# Patient Record
Sex: Male | Born: 1945
Health system: Southern US, Community
[De-identification: ages and names within clinical notes are randomized; demographics above are authoritative.]

## PROBLEM LIST (undated history)

## (undated) DIAGNOSIS — J449 Chronic obstructive pulmonary disease, unspecified: Secondary | ICD-10-CM

## (undated) DIAGNOSIS — I10 Essential (primary) hypertension: Secondary | ICD-10-CM

## (undated) DIAGNOSIS — M159 Polyosteoarthritis, unspecified: Secondary | ICD-10-CM

## (undated) DIAGNOSIS — E785 Hyperlipidemia, unspecified: Secondary | ICD-10-CM

## (undated) DIAGNOSIS — I499 Cardiac arrhythmia, unspecified: Secondary | ICD-10-CM

## (undated) DIAGNOSIS — K219 Gastro-esophageal reflux disease without esophagitis: Secondary | ICD-10-CM

## (undated) DIAGNOSIS — R7303 Prediabetes: Secondary | ICD-10-CM

## (undated) DIAGNOSIS — M1990 Unspecified osteoarthritis, unspecified site: Secondary | ICD-10-CM

## (undated) DIAGNOSIS — R011 Cardiac murmur, unspecified: Secondary | ICD-10-CM

## (undated) DIAGNOSIS — R002 Palpitations: Secondary | ICD-10-CM

## (undated) DIAGNOSIS — R7302 Impaired glucose tolerance (oral): Secondary | ICD-10-CM

## (undated) DIAGNOSIS — Z87898 Personal history of other specified conditions: Secondary | ICD-10-CM

## (undated) DIAGNOSIS — D509 Iron deficiency anemia, unspecified: Secondary | ICD-10-CM

## (undated) DIAGNOSIS — G5603 Carpal tunnel syndrome, bilateral upper limbs: Secondary | ICD-10-CM

## (undated) DIAGNOSIS — T7840XA Allergy, unspecified, initial encounter: Secondary | ICD-10-CM

## (undated) HISTORY — DX: Allergy, unspecified, initial encounter: T78.40XA

## (undated) HISTORY — DX: Essential (primary) hypertension: I10

## (undated) HISTORY — DX: Impaired glucose tolerance (oral): R73.02

## (undated) HISTORY — PX: COLONOSCOPY: SHX174

## (undated) HISTORY — DX: Chronic obstructive pulmonary disease, unspecified: J44.9

## (undated) HISTORY — DX: Carpal tunnel syndrome, bilateral upper limbs: G56.03

## (undated) HISTORY — DX: Unspecified osteoarthritis, unspecified site: M19.90

## (undated) HISTORY — DX: Polyosteoarthritis, unspecified: M15.9

## (undated) HISTORY — DX: Hyperlipidemia, unspecified: E78.5

## (undated) HISTORY — DX: Palpitations: R00.2

## (undated) HISTORY — DX: Iron deficiency anemia, unspecified: D50.9

## (undated) HISTORY — PX: TONSILLECTOMY: SUR1361

## (undated) HISTORY — PX: APPENDECTOMY: SHX54

## (undated) HISTORY — PX: JOINT REPLACEMENT: SHX530

## (undated) HISTORY — DX: Personal history of other specified conditions: Z87.898

---

## 2003-06-02 DIAGNOSIS — Z87898 Personal history of other specified conditions: Secondary | ICD-10-CM

## 2003-06-02 HISTORY — DX: Personal history of other specified conditions: Z87.898

## 2004-09-30 ENCOUNTER — Encounter: Payer: Self-pay | Admitting: Internal Medicine

## 2008-06-01 DIAGNOSIS — R002 Palpitations: Secondary | ICD-10-CM

## 2008-06-01 HISTORY — DX: Palpitations: R00.2

## 2008-07-18 ENCOUNTER — Ambulatory Visit: Payer: Self-pay | Admitting: Internal Medicine

## 2008-08-14 DIAGNOSIS — R002 Palpitations: Secondary | ICD-10-CM | POA: Insufficient documentation

## 2008-08-14 DIAGNOSIS — I10 Essential (primary) hypertension: Secondary | ICD-10-CM

## 2008-08-14 DIAGNOSIS — E782 Mixed hyperlipidemia: Secondary | ICD-10-CM | POA: Insufficient documentation

## 2008-08-15 ENCOUNTER — Encounter: Payer: Self-pay | Admitting: Internal Medicine

## 2008-08-15 ENCOUNTER — Encounter (INDEPENDENT_AMBULATORY_CARE_PROVIDER_SITE_OTHER): Payer: Self-pay | Admitting: *Deleted

## 2008-08-15 ENCOUNTER — Ambulatory Visit: Payer: Self-pay | Admitting: Internal Medicine

## 2008-11-16 ENCOUNTER — Ambulatory Visit: Payer: Self-pay | Admitting: Internal Medicine

## 2008-11-16 ENCOUNTER — Encounter: Payer: Self-pay | Admitting: Internal Medicine

## 2009-05-01 ENCOUNTER — Ambulatory Visit: Payer: Self-pay | Admitting: Internal Medicine

## 2009-11-26 ENCOUNTER — Telehealth: Payer: Self-pay | Admitting: Internal Medicine

## 2009-12-30 LAB — HM COLONOSCOPY

## 2010-01-07 ENCOUNTER — Ambulatory Visit: Payer: Self-pay | Admitting: Internal Medicine

## 2010-01-14 ENCOUNTER — Encounter: Payer: Self-pay | Admitting: Internal Medicine

## 2010-01-28 ENCOUNTER — Telehealth: Payer: Self-pay | Admitting: Internal Medicine

## 2010-01-31 ENCOUNTER — Telehealth: Payer: Self-pay | Admitting: Internal Medicine

## 2010-02-10 ENCOUNTER — Telehealth: Payer: Self-pay | Admitting: Internal Medicine

## 2010-02-11 ENCOUNTER — Telehealth: Payer: Self-pay | Admitting: Internal Medicine

## 2010-02-19 ENCOUNTER — Ambulatory Visit: Payer: Self-pay | Admitting: Internal Medicine

## 2010-02-28 ENCOUNTER — Encounter: Payer: Self-pay | Admitting: Internal Medicine

## 2010-02-28 LAB — CONVERTED CEMR LAB
Calcium: 9 mg/dL (ref 8.4–10.5)
Glucose, Bld: 113 mg/dL — ABNORMAL HIGH (ref 70–99)
Potassium: 3.7 meq/L (ref 3.5–5.3)
Sodium: 137 meq/L (ref 135–145)

## 2010-06-29 LAB — CONVERTED CEMR LAB
AST: 25 units/L
BUN: 16 mg/dL
Chloride: 102 meq/L
Cholesterol: 155 mg/dL
Ferritin: 8 ng/mL
HDL: 53 mg/dL
Hgb A1c MFr Bld: 6.2 %
Iron: 49 ug/dL
Potassium: 4.7 meq/L
Sodium: 140 meq/L
TSH: 0.75 microintl units/mL
Triglyceride fasting, serum: 108 mg/dL

## 2010-07-01 NOTE — Letter (Signed)
Summary: BP Readings  BP Readings   Imported By: Harlon Flor 01/14/2010 15:33:35  _____________________________________________________________________  External Attachment:    Type:   Image     Comment:   External Document  Appended Document: BP Readings Would you like to increase pt's lisinopril? Thanks.

## 2010-07-01 NOTE — Progress Notes (Signed)
Summary: RX QUESTIONS  Phone Note Call from Patient Call back at Home Phone [phone]   Caller: SELF Call For: Mark Farley Summary of Call: Mark Farley WANTED TO INCREASE DOSAGE IF BP WAS OVER 75-BP IS OVER THAT AND THE DOSAGE IS NOT INCREASED ON THE RX Initial call taken by: Harlon Flor,  January 31, 2010 11:04 AM  Follow-up for Phone Call        Dr. Prescott Gum last note stated that if BP >135 he would increase lisinopril. Pt's Bp readings more than half are less that 135 so no medictions would be changed at this time. Asked the pt to continue checking is BP's and recording them.  Follow-up by: Benedict Needy, RN,  January 31, 2010 11:19 AM

## 2010-07-01 NOTE — Assessment & Plan Note (Signed)
Summary: F6M/AMD   Visit Type:  Follow-up Primary Labrenda Lasky:  Michel Harrow PA-C at [facility]  CC:  NO COMPLAINTS.  History of Present Illness: 65 y/o  male with h/o HTN, HL and previous syncope with negative Myoview in 2005. He also has a h/o palpitations and had a Life Watch monitor for several weeks but no events. Monitor only caught a single PVC.   Remains very active exercising on elliptical and bike and weights for 2 hours 5x week. No CP or undue dyspnea during exercise. Minimal palpitations. Active with beekeeping.   Following cholesterol at [facility] which has improved. Recent lipids TC 1160 TG 83 HDL 48 LDL 95 (up from 80). HgBa1c 6.0.   Following BPs and systolics typically under 110. BP cuff calibrated within last year and it was accurate.   Current Medications (verified): 1)  Multivitamins   Tabs (Multiple Vitamin) .Marland Kitchen.. 1 Tab Once Daily 2)  Vitamin C 500 Mg  Tabs (Ascorbic Acid) .Marland Kitchen.. 1 Tab Once Daily 3)  Fish Oil   Oil1000mg   (Fish Oil) .Marland Kitchen.. 1 Tab Once Daily 4)  Hydrochlorothiazide 25 Mg Tabs (Hydrochlorothiazide) .Marland Kitchen.. 1 Tab Once Daily 5)  Simvastatin 20 Mg Tabs (Simvastatin) .... Take 1 1/2 Once Daily 6)  Aspirin 81 Mg Tbec (Aspirin) .... Take One Tablet By Mouth Daily 7)  Vitamin E 600 Unit  Caps (Vitamin E) .Marland Kitchen.. 1 Tab Once Daily 8)  Lisinopril 10 Mg Tabs (Lisinopril) .Marland Kitchen.. 1 Tab Once Daily 9)  Etodolac 400 Mg Tabs (Etodolac) .Marland Kitchen.. 1 Tab Once Daily 10)  Omeprazole 20 Mg Tbec (Omeprazole) .Marland Kitchen.. 1 Tab Once Daily 11)  Toprol Xl 25 Mg Xr24h-Tab (Metoprolol Succinate) .... Take 1 Tablet By Mouth Once A Day 12)  Vitamin C 250 Mg Tabs (Ascorbic Acid) .Marland Kitchen.. 1 Tab By Mouth Three Times A Day 13)  Iron 325 (65 Fe) Mg Tabs (Ferrous Sulfate) .Marland Kitchen.. 1 Tab By Mouth Three Times A Day  Allergies (verified): No Known Drug Allergies  Past History:  Past Medical History: Last updated: 05/01/2009  1. History of syncope, unexplained 2005.       a.     Myoview negative.   2. Hypertension  3.  Hyperlipidemia  4. Palpiptations      --Life Watch with single PVC, 2010  5. Arthritis (?gout)  6. Iron def anemia  7. Glucose intolerance  Review of Systems       As per HPI and past medical history; otherwise all systems negative.   Vital Signs:  Patient profile:   65 year old male Height:      67 inches Weight:      195 pounds BMI:     30.65 Pulse rate:   65 / minute BP sitting:   146 / 84  (left arm) Cuff size:   regular  Vitals Entered By: Hardin Negus, RMA (January 07, 2010 11:07 AM)  Physical Exam  General:  Gen: well appearing. no resp difficulty. muscular HEENT: normal Neck: supple. no JVD. Carotids 2+ bilat; no bruits.  Cor: PMI nondisplaced. Regular rate & rhythm. No rubs, gallops, murmur. Lungs: clear Abdomen: soft, nontender, nondistended. Good bowel sounds. Extremities: no cyanosis, clubbing, rash, edema Neuro: alert & orientedx3, cranial nerves grossly intact. moves all 4 extremities w/o difficulty. affect pleasant    Impression & Recommendations:  Problem # 1:  PALPITATIONS (ICD-785.1) Essentially quiescent.  No further work-up at this point.  Problem # 2:  HYPERTENSION, BENIGN (ICD-401.1) Suspect white-coat syndrome. Will check BP daily at  home for 2-3 weeks and send to [place]. If systolic > 135 will increase lisinopril 20.  Problem # 3:  HYPERLIPIDEMIA (ICD-272.4) LDL creeping up a littl ebit but still under 100. Continue simva. Reinforced need to watch diet.   Other Orders: EKG w/ Interpretation (93000)  Patient Instructions: 1)  Your physician recommends that you continue on your current medications as directed. Please refer to the Current Medication list given to you today. 2)  Your physician wants you to follow-up in:   9 months You will receive a reminder letter in the mail two months in advance. If you don't receive a letter, please call our office to schedule the follow-up appointment. 3)  Your physician has requested that you regularly  monitor and record your blood pressure readings at home.Please fax these records to [place] in 1 week.

## 2010-07-01 NOTE — Progress Notes (Signed)
Summary: MED CHANGES  Phone Note Call from Patient Call back at Home Phone [phone]   Caller: SELF Call For: BENSIMHON Summary of Call: IS PT SUPPOSED TO START THE MEDICATION CHANGES NOW Initial call taken by: Harlon Flor,  February 11, 2010 2:28 PM  Follow-up for Phone Call        pt to start new dosage now Benedict Needy, RN  February 11, 2010 2:49 PM

## 2010-07-01 NOTE — Letter (Signed)
Summary: [place] [employer], agricultural at [address]   [place], [place] [address]   Phone: [phone]  Fax: [phone]      February 28, 2010 MRN: [id number]   Mark Farley [address] [place], [place]  [address]   Dear Mr. BOUTELLE,  Your test ordered by Selena Batten has been reviewed by your physician (or physician assistant) and was found to be normal or stable. Your physician (or physician assistant) felt no changes were needed at this time.  ____ Echocardiogram  ____ Cardiac Stress Test  __x__ Lab Work  ____ Peripheral vascular study of arms, legs or neck  ____ CT scan or X-ray  ____ Lung or Breathing test  ____ Other:   Thank you.   Benedict Needy, RN    Arvilla Meres, MD, F.A.C.C

## 2010-07-01 NOTE — Progress Notes (Signed)
Summary: RX  Phone Note Refill Request Call back at Home Phone [phone] Message from:  Patient on January 28, 2010 11:28 AM  Refills Requested: Medication #1:  SIMVASTATIN 20 MG TABS take 1 1/2 once daily  Medication #2:  HYDROCHLOROTHIAZIDE 25 MG TABS 1 tab once daily [facility]Y ONLY TAKE WRITTEN RX-PT IS ALSO WAITING TO HEAR IF HE NEEDS TO CONTINUE LISINOPRIL AFTER GOLLAN REVIEWED HIS BP READINGS THAT HE CALLED IN 2 WEEKS AGO  Initial call taken by: Harlon Flor,  January 28, 2010 11:31 AM  Follow-up for Phone Call        Told patients wife we will call tomorrow with Rx's are ready to be picked up. Follow-up by: Bishop Dublin, CMA,  January 29, 2010 1:38 PM    Prescriptions: LISINOPRIL 10 MG TABS (LISINOPRIL) 1 tab once daily  #90 x 4   Entered by:   Benedict Needy, RN   Authorized by:   Dolores Patty, MD, [facility]   Signed by:   Benedict Needy, RN on 01/29/2010   Method used:   Print then Give to Patient   RxID:   [id number] SIMVASTATIN 20 MG TABS (SIMVASTATIN) take 1 1/2 once daily  #135 x 4   Entered by:   Benedict Needy, RN   Authorized by:   Dolores Patty, MD, [facility]   Signed by:   Benedict Needy, RN on 01/29/2010   Method used:   Print then Give to Patient   RxID:   [id number] HYDROCHLOROTHIAZIDE 25 MG TABS (HYDROCHLOROTHIAZIDE) 1 tab once daily  #90 x 4   Entered by:   Benedict Needy, RN   Authorized by:   Dolores Patty, MD, [facility]   Signed by:   Benedict Needy, RN on 01/29/2010   Method used:   Print then Give to Patient   RxID:   [id number]

## 2010-07-01 NOTE — Progress Notes (Signed)
Summary: BP  Phone Note Call from Patient Call back at Home Phone [phone]   Caller: Patient Call For: Morrie Sheldon Summary of Call: Patient called wanting to know if Morrie Sheldon had checked with Dr. Gala Romney yet about his BP readings.  Either way please call patient back today, per patient's request. Initial call taken by: [place] Carbo,  February 10, 2010 1:04 PM  Follow-up for Phone Call        message sent to Dr. Gala Romney. LMOM TCB. Benedict Needy, RN  February 10, 2010 2:43 PM   Wife called and stated that his BP's were greater than 135 systolically but she did not have the recordings. She will call back with the readings. Benedict Needy, RN  February 10, 2010 4:12 PM   Pt's BP  9/8 133/79 9/9  136/79 9/11 120/80 9/12 136/65,140/88 Please advise if you would like to change pt's BP meds.  Follow-up by: Benedict Needy, RN,  February 10, 2010 4:34 PM  Additional Follow-up for Phone Call Additional follow up Details #1::        increase lisinopril to 20 once daily. check bmet 1 week. thanks. Dolores Patty, MD, [facility]  February 10, 2010 11:13 PM  LMOM TCB Benedict Needy, RN  February 11, 2010 10:06 AM  Pt is aware of medication change. Benedict Needy, RN  February 11, 2010 3:45 PM      New/Updated Medications: LISINOPRIL 20 MG TABS (LISINOPRIL) Take 1 tablet by mouth once a day -Prescriptions: LISINOPRIL 20 MG TABS (LISINOPRIL) Take 1 tablet by mouth once a day  #90 x 11   Entered by:   Benedict Needy, RN   Authorized by:   Dolores Patty, MD, [facility]   Signed by:   Benedict Needy, RN on 02/11/2010   Method used:   Print then Give to Patient   RxID:   [id number]

## 2010-07-01 NOTE — Progress Notes (Signed)
Summary: Rx given to pt. for Torprol  Phone Note Refill Request Message from:  Patient on November 26, 2009 1:41 PM  Refills Requested: Medication #1:  TOPROL XL 25 MG XR24H-TAB Take 1 tablet by mouth once a day Patient needs script written out to pick-up for 90 day supply to take with him to the [place] to get filled. Please call pt when script ready to be picked up.  Initial call taken by: [place] Carbo,  November 26, 2009 1:42 PM    Prescriptions: TOPROL XL 25 MG XR24H-TAB (METOPROLOL SUCCINATE) Take 1 tablet by mouth once a day  #90 x 3   Entered by:   Bishop Dublin, CMA   Authorized by:   Dolores Patty, MD, [facility]   Signed by:   Bishop Dublin, CMA on 11/26/2009   Method used:   Print then Give to Patient   RxID:   [id number]   Appended Document: Rx given to pt. for Torprol    Clinical Lists Changes  Medications: Rx of TOPROL XL 25 MG XR24H-TAB (METOPROLOL SUCCINATE) Take 1 tablet by mouth once a day;  #90 x 3;  Signed;  Entered by: Benedict Needy, RN;  Authorized by: Dossie Arbour MD;  Method used: Print then Give to Patient    Prescriptions: TOPROL XL 25 MG XR24H-TAB (METOPROLOL SUCCINATE) Take 1 tablet by mouth once a day  #90 x 3   Entered by:   Benedict Needy, RN   Authorized by:   Dossie Arbour MD   Signed by:   Benedict Needy, RN on 11/28/2009   Method used:   Print then Give to Patient   RxID:   [id number]

## 2010-09-30 ENCOUNTER — Other Ambulatory Visit: Payer: Self-pay | Admitting: Internal Medicine

## 2010-09-30 NOTE — Telephone Encounter (Signed)
Will pick up written script.  90 day supply.

## 2010-10-01 ENCOUNTER — Telehealth: Payer: Self-pay

## 2010-10-01 ENCOUNTER — Other Ambulatory Visit: Payer: Self-pay

## 2010-10-01 MED ORDER — METOPROLOL SUCCINATE ER 25 MG PO TB24: 25.0000 mg | ORAL_TABLET | Freq: Every day | ORAL | Status: DC

## 2010-10-01 NOTE — Telephone Encounter (Signed)
The patient is only taking Toprol XL 25 mg take one tablet daily.

## 2010-10-01 NOTE — Telephone Encounter (Signed)
The patient states only taking the Toprol xl 25 mg take one tablet daily.

## 2010-10-14 NOTE — Assessment & Plan Note (Signed)
[facility] OFFICE NOTE   Mark Farley, Mark Farley                   MRN:          [id number]  DATE:07/18/2008                            DOB:          [birth year]    REFERRING PHYSICIAN:  Earlie Server. Talbot Grumbling, MD   PRIMARY CARE Latria Mccarron:  Dr. Michel Harrow who is at the [facility].   REASON FOR CONSULTATION:  Abnormal sensations in chest.   Mark Farley is a delightful 65 year old male who is the uncle with Dr.  Shanna Cisco, director of the ER at [facility] in [place].   He denies any history of known coronary artery disease.  He did see Dr.  Myrtis Ser back in 2005 for an episode of unexplained syncope.  He had  Cardiolite which showed an EF of 53%.  No ischemia or scar.  He was then  referred to Dr. Lewayne Bunting in EP for further evaluation and  implantation of a loop recorder was considered, but they decided to  refer this.  He has not had any further syncope.   He notes that he developed some abnormal sensations, which were kind of  like palpitations in his chest last year.  He did some reading on the  Internet and found out that these may be cause by aspartame containing  drinks, so he stopped this and they went away.  However, over the past 2  months, he has noticed recurrent symptoms.  He feels like he has heavy  heart beat and these kind of come and go for several minutes.  He has  not had any sustained episodes.  He notices them most when he is at  rest.  He has no problems with exercise.  He works out about 2 hours a  day with a good regimen on the elliptical trainer and bike without any  chest pain or dyspnea.  He has not had any change in his exercise  capacity.  He does note that these chest sensations or palpitations have  been more frequent lately.  Once again, he has not had any further  syncope or presyncope.   PAST MEDICAL HISTORY:  1. History of syncope, unexplained 2005.      a.      Myoview negative.  2. Hypertension.  3. Hyperlipidemia.   REVIEW OF SYSTEMS:  Notable for arthritis pain with a questionable  history of gout.   All other systems otherwise negative except as mentioned in the HPI and  problem list.   CURRENT MEDICATIONS:  1. Multivitamin.  2. Hydrochlorothiazide 25 a day.  3. Simvastatin 30 a day.  4. Vitamin C.  5. Aspirin 81 a day.  6. Vitamin E.  7. Lisinopril 10 a day.  8. Etodolac 40 mg a day.  9. Prilosec 20 a day.  10.Fish oil 1000 a day.  11.Calcium 600 mg 3 times a week.   ALLERGIES:  He has no known drug allergies.   SOCIAL HISTORY:  He is married.  Does not smoke.  He does drink 2  glasses of wine a day.  He is very  active.   FAMILY HISTORY:  Father died of congestive heart failure at 41.  Mother  died in her 22s.  She had a history of coronary artery disease and died  from myocardial infarction.   PHYSICAL EXAMINATION:  GENERAL:  He is a healthy-appearing male in no  acute distress.  Ambulates around the clinic without respiratory  difficulty.  VITAL SIGNS:  Initial blood pressure was 158/66 with a heart rate of 75,  on manual recheck, it was 135/66, weight was 191.  HEENT:  Normal.  NECK:  Supple.  There is no JVD.  Carotids are 2+ bilaterally without  any bruits.  CHEST:  PMI is normal.  Regular rate and rhythm.  No murmurs, rubs, or  gallops.  LUNGS:  Clear.  ABDOMEN:  Soft, nontender, nondistended.  No hepatosplenomegaly.  No  bruits.  No masses.  Good bowel sounds.  EXTREMITIES:  Warm with no  cyanosis, clubbing, or edema.  No rash.  NEURO:  Alert and oriented x3.  Cranial nerves II through XII are  intact.  Moves all 4 extremities without difficulty.  Affect is  pleasant.   EKG shows normal sinus rhythm at rate of 75 with normal axis and  intervals.  No significant T-wave abnormalities.   ASSESSMENT AND PLAN:  1. Palpitations.  I suspect these are related to premature atrial      contractions and premature  ventricular contractions.  However, I      would like to put an event monitor on him to further evaluate.  If      his event monitor does not show any ectopy, I think, he will then      need a further workup most likely with a repeat echocardiogram and      stress test.  2. Chronic hypertension.  This is followed by Mr. Peoples.  I suspect      he will need to titrate his medications further.   DISPOSITION:  We will see him back in 2 weeks to review the results of  his monitor and plan further course of action.  I told him to contact me  sooner if his symptoms continued to get worse.  He knows to avoid all  caffeine.     Bevelyn Buckles. Bensimhon, MD  Electronically Signed    DRB/MedQ  DD: 07/18/2008  DT: 07/19/2008  Job #: [id number]   cc:   Michel Harrow, MD @ [facility]

## 2010-11-12 ENCOUNTER — Encounter: Payer: Self-pay | Admitting: Internal Medicine

## 2010-11-12 DIAGNOSIS — Z87898 Personal history of other specified conditions: Secondary | ICD-10-CM | POA: Insufficient documentation

## 2010-11-19 ENCOUNTER — Ambulatory Visit (INDEPENDENT_AMBULATORY_CARE_PROVIDER_SITE_OTHER): Payer: Medicare Other | Admitting: Internal Medicine

## 2010-11-19 ENCOUNTER — Encounter: Payer: Self-pay | Admitting: Internal Medicine

## 2010-11-19 VITALS — BP 148/88 | HR 65 | Ht 67.0 in | Wt 198.1 lb

## 2010-11-19 DIAGNOSIS — I10 Essential (primary) hypertension: Secondary | ICD-10-CM

## 2010-11-19 MED ORDER — LISINOPRIL 40 MG PO TABS: 40.0000 mg | ORAL_TABLET | Freq: Every day | ORAL | Status: DC

## 2010-11-19 NOTE — Assessment & Plan Note (Addendum)
BP elevated. Will increase lisinopril to 40 daily. If BP not well controlled may need to switch lopressor to carvedilol or add amlodipine. Encouraged him to lose 10-15 pounds.

## 2010-11-19 NOTE — Patient Instructions (Signed)
Increase Lisinopril to 40mg  once daily.  Your physician recommends that you schedule a follow-up appointment in: 4 months

## 2010-11-19 NOTE — Progress Notes (Signed)
HPI:  65 y/o  male with h/o HTN, HL and previous syncope with negative Myoview in 2005. He also has a h/o palpitations and had a Life Watch monitor for several weeks but no events. Monitor only caught a single PVC.   Remains very active exercising on elliptical and bike and weights for 2 hours 5x week. No CP or undue dyspnea during exercise. Minimal palpitations. Active with beekeeping.   Was seen at [facility] recently and BP was up. Went to nutrition class and then saw a pharmacist who recommended increasing his meds. Taking his BP 3-4x/day. Gained 10 pounds over past year or two. Snoring some.  Following cholesterol at [facility] which has improved. Recent lipids TC 162 TG 97 HDL 52 LDL 91 (down from 95). HgBa1c 5.7.    ROS: All systems negative except as listed in HPI, PMH and Problem List.  Past Medical History  Diagnosis Date  . History of syncope 2005    Unexplained, myoview negative  . Hypertension   . Hyperlipidemia   . Palpitations 2010    Life Watch with single PVC  . Arthritis     ? Gout  . Iron deficiency anemia   . Glucose intolerance (impaired glucose tolerance)     Current Outpatient Prescriptions  Medication Sig Dispense Refill  . aspirin 81 MG EC tablet Take 81 mg by mouth daily.        . calcium carbonate (OS-CAL) 600 MG TABS Take 600 mg by mouth as directed. 3 times a week       . etodolac (LODINE) 400 MG tablet Take 400 mg by mouth 2 (two) times daily.       . ferrous sulfate 325 (65 FE) MG EC tablet Take 325 mg by mouth 3 (three) times daily with meals.        . fish oil-omega-3 fatty acids 1000 MG capsule Take 1 capsule by mouth daily.        . hydrochlorothiazide 25 MG tablet Take 25 mg by mouth daily.        Marland Kitchen lisinopril (PRINIVIL,ZESTRIL) 20 MG tablet Take 20 mg by mouth daily.        . metoprolol succinate (TOPROL XL) 25 MG 24 hr tablet Take 1 tablet (25 mg total) by mouth daily.  90 tablet  3  . Multiple Vitamin (MULTIVITAMIN) tablet Take 1 tablet by mouth daily.         Marland Kitchen omeprazole (PRILOSEC) 20 MG capsule Take 20 mg by mouth daily.        . simvastatin (ZOCOR) 20 MG tablet Take 30 mg by mouth at bedtime.        . vitamin C (ASCORBIC ACID) 250 MG tablet Take 250 mg by mouth 3 (three) times daily.        . vitamin C (ASCORBIC ACID) 500 MG tablet Take 500 mg by mouth daily.        . vitamin E 600 UNIT capsule Take 600 Units by mouth daily.           PHYSICAL EXAM: Filed Vitals:   11/19/10 1116  BP: 146/96  Pulse: 65   General:  Well appearing. No resp difficulty HEENT: normal Neck: supple. JVP flat. Carotids 2+ bilaterally; no bruits. No lymphadenopathy or thryomegaly appreciated. Cor: PMI normal. Regular rate & rhythm. No rubs, gallops or murmurs. Lungs: clear Abdomen: soft, nontender, nondistended. No hepatosplenomegaly. No bruits or masses. Good bowel sounds. Extremities: no cyanosis, clubbing, rash, edema Neuro: alert & orientedx3, cranial nerves  grossly intact. Moves all 4 extremities w/o difficulty. Affect pleasant.    ECG: NSR 65 No ST-T wave abnormalities.     ASSESSMENT & PLAN:

## 2010-12-22 ENCOUNTER — Telehealth: Payer: Self-pay | Admitting: Internal Medicine

## 2010-12-22 NOTE — Telephone Encounter (Signed)
Needs written Rx for Simvastatin 30 mg and HCTZ 25 mg.  Please write for a 90 day supply and call the patient when this is ready for pick-up.

## 2010-12-24 ENCOUNTER — Telehealth: Payer: Self-pay | Admitting: Internal Medicine

## 2010-12-24 MED ORDER — HYDROCHLOROTHIAZIDE 25 MG PO TABS: 25.0000 mg | ORAL_TABLET | Freq: Every day | ORAL | Status: DC

## 2010-12-24 MED ORDER — SIMVASTATIN 20 MG PO TABS: 30.0000 mg | ORAL_TABLET | Freq: Every day | ORAL | Status: DC

## 2010-12-24 NOTE — Telephone Encounter (Signed)
Rx written for HCTZ and simvastatin.

## 2010-12-24 NOTE — Telephone Encounter (Signed)
Notified patient Rx ready to be picked up for HCTZ & simvastatin 30 mg.

## 2010-12-24 NOTE — Telephone Encounter (Signed)
Patient is still waiting for a return call in regards to picking up 2 written scripts.  Please advise.

## 2011-02-05 ENCOUNTER — Ambulatory Visit (INDEPENDENT_AMBULATORY_CARE_PROVIDER_SITE_OTHER): Payer: Medicare Other | Admitting: Internal Medicine

## 2011-02-05 ENCOUNTER — Encounter: Payer: Self-pay | Admitting: Internal Medicine

## 2011-02-05 DIAGNOSIS — E669 Obesity, unspecified: Secondary | ICD-10-CM

## 2011-02-05 DIAGNOSIS — I10 Essential (primary) hypertension: Secondary | ICD-10-CM

## 2011-02-05 DIAGNOSIS — Z800 Family history of malignant neoplasm of digestive organs: Secondary | ICD-10-CM | POA: Insufficient documentation

## 2011-02-05 DIAGNOSIS — E785 Hyperlipidemia, unspecified: Secondary | ICD-10-CM

## 2011-02-05 DIAGNOSIS — Z1211 Encounter for screening for malignant neoplasm of colon: Secondary | ICD-10-CM

## 2011-02-05 DIAGNOSIS — R002 Palpitations: Secondary | ICD-10-CM

## 2011-02-05 NOTE — Progress Notes (Signed)
  Subjective:    Patient ID: Mark Farley., male    DOB: 06-15-1945, 65 y.o.   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  No acute issues.  Physically active, exercises 5 days weekly.  Has some OA  involving  Hands and knees.      Review of Systems  Constitutional: Negative for fever, chills, diaphoresis, activity change, appetite change, fatigue and unexpected weight change.  HENT: Negative for hearing loss, ear pain, nosebleeds, congestion, sore throat, facial swelling, rhinorrhea, sneezing, drooling, mouth sores, trouble swallowing, neck pain, neck stiffness, dental problem, voice change, postnasal drip, sinus pressure, tinnitus and ear discharge.   Eyes: Negative for photophobia, pain, discharge, redness, itching and visual disturbance.  Respiratory: Negative for apnea, cough, choking, chest tightness, shortness of breath, wheezing and stridor.   Cardiovascular: Negative for chest pain, palpitations and leg swelling.  Gastrointestinal: Negative for nausea, vomiting, abdominal pain, diarrhea, constipation, blood in stool, abdominal distention, anal bleeding and rectal pain.  Genitourinary: Negative for dysuria, urgency, frequency, hematuria, flank pain, decreased urine volume, scrotal swelling, difficulty urinating and testicular pain.  Musculoskeletal: Positive for back pain and arthralgias. Negative for myalgias, joint swelling and gait problem.  Skin: Negative for color change, rash and wound.  Neurological: Negative for dizziness, tremors, seizures, syncope, speech difficulty, weakness, light-headedness, numbness and headaches.  Psychiatric/Behavioral: Negative for suicidal ideas, hallucinations, behavioral problems, confusion, sleep disturbance, dysphoric mood, decreased concentration and agitation. The patient is not nervous/anxious.        Objective:   Physical Exam  Constitutional: He is oriented to person, place, and time.  HENT:   Head: Normocephalic and atraumatic.  Mouth/Throat: Oropharynx is clear and moist.  Eyes: Conjunctivae and EOM are normal.  Neck: Normal range of motion. Neck supple. No JVD present. No thyromegaly present.  Cardiovascular: Normal rate, regular rhythm and normal heart sounds.   Pulmonary/Chest: Effort normal and breath sounds normal. He has no wheezes. He has no rales.  Abdominal: Soft. Bowel sounds are normal. He exhibits no mass. There is no tenderness. There is no rebound.  Musculoskeletal: Normal range of motion. He exhibits no edema.  Neurological: He is alert and oriented to person, place, and time.  Skin: Skin is warm and dry.  Psychiatric: He has a normal mood and affect.          Assessment & Plan:

## 2011-02-05 NOTE — Patient Instructions (Addendum)
Consider trying G2 (gatorade without all the sugar and calories) to replace electrolytes after you work out. Consider protein bars and proteins shakes by ATkins to reduce your carbohydrate intake.  Cut back on bananas, pineapples, and watermelons (full of sugar) . Stick with cherries and berries.   Diastasis recti (your abdominal complaint)  Add 40 mg of famotidine to your benadryl for your next beesting.  Consider pre treating yourself with allegra or zyrtec .

## 2011-02-05 NOTE — Assessment & Plan Note (Signed)
Last one 2009,  Normal.  Done at [facility]. Prior polyp history so f/u is due every 5 yrs

## 2011-02-06 DIAGNOSIS — E663 Overweight: Secondary | ICD-10-CM | POA: Insufficient documentation

## 2011-02-06 NOTE — Assessment & Plan Note (Addendum)
Managed with zocor.  Goal LDL < 100.  Labs done at [facility] earlier in the year were at goal.  .

## 2011-02-06 NOTE — Assessment & Plan Note (Signed)
With prior assessment by cardioloy showing only occasional PVCs.  Currently asymptomatic on metoprolol

## 2011-02-06 NOTE — Assessment & Plan Note (Signed)
Currently well controlled. No changes today 

## 2011-02-06 NOTE — Assessment & Plan Note (Signed)
Spent 10 minutes reviewing his diet and exercise plan.  His diet is healthy but he is not restricting calories or carbohydrates.  Provided information on how to restrict starches.

## 2011-03-10 LAB — HM DIABETES EYE EXAM

## 2011-03-13 ENCOUNTER — Encounter: Payer: Self-pay | Admitting: Cardiovascular Disease

## 2011-03-17 ENCOUNTER — Ambulatory Visit (INDEPENDENT_AMBULATORY_CARE_PROVIDER_SITE_OTHER): Payer: Medicare Other | Admitting: Cardiovascular Disease

## 2011-03-17 ENCOUNTER — Encounter: Payer: Self-pay | Admitting: Cardiovascular Disease

## 2011-03-17 DIAGNOSIS — R002 Palpitations: Secondary | ICD-10-CM

## 2011-03-17 DIAGNOSIS — I10 Essential (primary) hypertension: Secondary | ICD-10-CM

## 2011-03-17 DIAGNOSIS — R42 Dizziness and giddiness: Secondary | ICD-10-CM | POA: Insufficient documentation

## 2011-03-17 DIAGNOSIS — E785 Hyperlipidemia, unspecified: Secondary | ICD-10-CM

## 2011-03-17 NOTE — Assessment & Plan Note (Signed)
No significant symptoms of palpitations. We have suggested he stay on his metoprolol

## 2011-03-17 NOTE — Progress Notes (Signed)
Patient ID: Mark Farley., male    DOB: 26-Jul-1945, 65 y.o.   MRN: [id number]  HPI Comments: 65 y/o  male with h/o HTN, HL and previous syncope with negative Myoview in 2005. He also has a h/o palpitations and had a Life Watch monitor for several weeks but no events. Monitor only caught a single PVC.  He does have troubles with arthritis, anemia from iron deficiency   Remains very active exercising on elliptical and bike and weights for 2 hours 5x week. No CP or undue dyspnea during exercise. Minimal palpitations. Active with beekeeping.   He reports that he had a recent episode of dizziness with standing. He monitored his blood pressure which showed systolic pressure ranging from 104-111, diastolic pressures in the 50s. He does have some dizziness when he works in the garden though this is typically after squatting./ He states that for many years, dating back to when he was young, he has had problems with polyuria. He wonders if the HCTZ may be making his symptoms worse   Following cholesterol at [facility] which has improved. Recent lipids TC 160 TG 83 HDL 48 LDL 95 (up from 80). HgBa1c 6.0.     EKG shows normal sinus rhythm with rate 68 beats per minute, nonspecific ST abnormality in anterolateral leads    Outpatient Encounter Prescriptions as of 03/17/2011  Medication Sig Dispense Refill  . aspirin 81 MG EC tablet Take 81 mg by mouth daily.        . calcium carbonate (OS-CAL) 600 MG TABS Take 600 mg by mouth as directed. 3 times a week       . etodolac (LODINE) 400 MG tablet Take 400 mg by mouth 2 (two) times daily.       . fish oil-omega-3 fatty acids 1000 MG capsule Take 1 capsule by mouth daily.        Marland Kitchen lisinopril (PRINIVIL,ZESTRIL) 40 MG tablet Take 1 tablet (40 mg total) by mouth daily.  90 tablet  3  . metoprolol succinate (TOPROL XL) 25 MG 24 hr tablet Take 1 tablet (25 mg total) by mouth daily.  90 tablet  3  . Multiple Vitamin (MULTIVITAMIN) tablet Take 1 tablet by mouth daily.         Marland Kitchen omeprazole (PRILOSEC) 20 MG capsule Take 20 mg by mouth daily.        . simvastatin (ZOCOR) 20 MG tablet Take 1.5 tablets (30 mg total) by mouth at bedtime.  135 tablet  3  . vitamin C (ASCORBIC ACID) 250 MG tablet Take 250 mg by mouth 3 (three) times daily.        . vitamin E 600 UNIT capsule Take 600 Units by mouth daily.        .  hydrochlorothiazide 25 MG tablet Take 1 tablet (25 mg total) by mouth daily.  90 tablet  3     Review of Systems  Constitutional: Negative.   HENT: Negative.   Eyes: Negative.   Respiratory: Negative.   Cardiovascular: Negative.   Gastrointestinal: Negative.   Musculoskeletal: Negative.   Skin: Negative.   Neurological: Positive for dizziness.  Hematological: Negative.   Psychiatric/Behavioral: Negative.   All other systems reviewed and are negative.    BP 120/78  Pulse 69  Ht 5\' 7"  (1.702 m)  Wt 199 lb 12 oz (90.606 kg)  BMI 31.29 kg/m2  Physical Exam  Nursing note and vitals reviewed. Constitutional: He is oriented to person, place, and time. He appears  well-developed and well-nourished.  HENT:  Head: Normocephalic.  Nose: Nose normal.  Mouth/Throat: Oropharynx is clear and moist.  Eyes: Conjunctivae are normal. Pupils are equal, round, and reactive to light.  Neck: Normal range of motion. Neck supple. No JVD present.  Cardiovascular: Normal rate, regular rhythm, S1 normal, S2 normal, normal heart sounds and intact distal pulses.  Exam reveals no gallop and no friction rub.   No murmur heard. Pulmonary/Chest: Effort normal and breath sounds normal. No respiratory distress. He has no wheezes. He has no rales. He exhibits no tenderness.  Abdominal: Soft. Bowel sounds are normal. He exhibits no distension. There is no tenderness.  Musculoskeletal: Normal range of motion. He exhibits no edema and no tenderness.  Lymphadenopathy:    He has no cervical adenopathy.  Neurological: He is alert and oriented to person, place, and time.  Coordination normal.  Skin: Skin is warm and dry. No rash noted. No erythema.  Psychiatric: He has a normal mood and affect. His behavior is normal. Judgment and thought content normal.           Assessment and Plan

## 2011-03-17 NOTE — Assessment & Plan Note (Signed)
He has had rare episodes of dizziness, back in September with standing consistent with orthostasis. Rare episodes when he is in the garden working. We'll hold HCTZ and monitor him for now. If he continues to have low blood pressure, we have suggested he cut his lisinopril in half.

## 2011-03-17 NOTE — Patient Instructions (Addendum)
You are doing well. Please hold your HCTZ and watch your blood pressure Please call us if you have new issues that need to be addressed before your next appt.  The office will contact you for a follow up Appt. In 12 months

## 2011-03-17 NOTE — Assessment & Plan Note (Signed)
We have suggested he continue on his statin. Previous numbers from 2011 were very reasonable

## 2011-03-17 NOTE — Assessment & Plan Note (Signed)
Recent recorded low blood pressures with  occasional dizziness. Occasional orthostatic Symptoms. We have suggested he hold his HCTZ and closely monitor his blood pressure. He does have polyuria prior to the start of HCTZ.

## 2011-08-06 ENCOUNTER — Ambulatory Visit (INDEPENDENT_AMBULATORY_CARE_PROVIDER_SITE_OTHER): Payer: Medicare Other | Admitting: Internal Medicine

## 2011-08-06 ENCOUNTER — Encounter: Payer: Self-pay | Admitting: Internal Medicine

## 2011-08-06 VITALS — BP 142/84 | HR 66 | Temp 98.3°F | Resp 14 | Ht 67.0 in | Wt 204.8 lb

## 2011-08-06 DIAGNOSIS — M1710 Unilateral primary osteoarthritis, unspecified knee: Secondary | ICD-10-CM

## 2011-08-06 DIAGNOSIS — R7302 Impaired glucose tolerance (oral): Secondary | ICD-10-CM

## 2011-08-06 DIAGNOSIS — M170 Bilateral primary osteoarthritis of knee: Secondary | ICD-10-CM

## 2011-08-06 DIAGNOSIS — R7309 Other abnormal glucose: Secondary | ICD-10-CM

## 2011-08-06 DIAGNOSIS — R7303 Prediabetes: Secondary | ICD-10-CM | POA: Insufficient documentation

## 2011-08-06 DIAGNOSIS — M1990 Unspecified osteoarthritis, unspecified site: Secondary | ICD-10-CM

## 2011-08-06 DIAGNOSIS — I10 Essential (primary) hypertension: Secondary | ICD-10-CM

## 2011-08-06 DIAGNOSIS — E785 Hyperlipidemia, unspecified: Secondary | ICD-10-CM

## 2011-08-06 DIAGNOSIS — E669 Obesity, unspecified: Secondary | ICD-10-CM

## 2011-08-06 MED ORDER — NABUMETONE 750 MG PO TABS: 750.0000 mg | ORAL_TABLET | Freq: Two times a day (BID) | ORAL | Status: DC

## 2011-08-06 NOTE — Progress Notes (Signed)
Subjective:    Patient ID: Mark Farley., male    DOB: 22-Aug-1945, 66 y.o.   MRN: [id number]  HPI  Mark Farley is a 66 yr old white male with a history of hypertension, hyperlipidemia and impaired glucose tolerance who presents for 6 month followup,  He has had 15 lb weight gain over the last 3 years despite regular aerobic exercise.  He reports some mild low back pain and bilateral knee pain aggravated by walking and standing in one place as well as by inacitivit and prolonged sitting. He has had prior trial of glucosamine /chondrointin sulfate supplements with no appreciable effect.   His diet is rich in carbohydrates and low in saturated fats. He exercise for 1.5 to 2 hours 3 times per week at the gym.  Past Medical History  Diagnosis Date  . History of syncope 2005    Unexplained, myoview negative  . Hypertension   . Hyperlipidemia   . Palpitations 2010    Life Watch with single PVC  . Arthritis     ? Gout  . Iron deficiency anemia   . Glucose intolerance (impaired glucose tolerance)   . Bilateral carpal tunnel syndrome     managed with nocturnal braces x 3 years  . Osteoarthritis of multiple joints   . COPD (chronic obstructive pulmonary disease)    Current Outpatient Prescriptions on File Prior to Visit  Medication Sig Dispense Refill  . aspirin 81 MG EC tablet Take 81 mg by mouth daily.        . calcium carbonate (OS-CAL) 600 MG TABS Take 600 mg by mouth as directed. 3 times a week       . etodolac (LODINE) 400 MG tablet Take 400 mg by mouth 2 (two) times daily.       . fish oil-omega-3 fatty acids 1000 MG capsule Take 1 capsule by mouth daily.        Marland Kitchen lisinopril (PRINIVIL,ZESTRIL) 40 MG tablet Take 1 tablet (40 mg total) by mouth daily.  90 tablet  3  . metoprolol succinate (TOPROL XL) 25 MG 24 hr tablet Take 1 tablet (25 mg total) by mouth daily.  90 tablet  3  . Multiple Vitamin (MULTIVITAMIN) tablet Take 1 tablet by mouth daily.        Marland Kitchen omeprazole (PRILOSEC) 20  MG capsule Take 20 mg by mouth daily.        . simvastatin (ZOCOR) 20 MG tablet Take 1.5 tablets (30 mg total) by mouth at bedtime.  135 tablet  3  . vitamin C (ASCORBIC ACID) 250 MG tablet Take 250 mg by mouth 3 (three) times daily.        . vitamin E 600 UNIT capsule Take 600 Units by mouth daily.            Review of Systems  Constitutional: Negative for fever, chills, diaphoresis, activity change, appetite change, fatigue and unexpected weight change.  HENT: Negative for hearing loss, ear pain, nosebleeds, congestion, sore throat, facial swelling, rhinorrhea, sneezing, drooling, mouth sores, trouble swallowing, neck pain, neck stiffness, dental problem, voice change, postnasal drip, sinus pressure, tinnitus and ear discharge.   Eyes: Negative for photophobia, pain, discharge, redness, itching and visual disturbance.  Respiratory: Negative for apnea, cough, choking, chest tightness, shortness of breath, wheezing and stridor.   Cardiovascular: Negative for chest pain, palpitations and leg swelling.  Gastrointestinal: Negative for nausea, vomiting, abdominal pain, diarrhea, constipation, blood in stool, abdominal distention, anal bleeding and rectal pain.  Genitourinary: Negative for dysuria, urgency, frequency, hematuria, flank pain, decreased urine volume, scrotal swelling, difficulty urinating and testicular pain.  Musculoskeletal: Positive for back pain and arthralgias. Negative for myalgias, joint swelling and gait problem.  Skin: Negative for color change, rash and wound.  Neurological: Negative for dizziness, tremors, seizures, syncope, speech difficulty, weakness, light-headedness, numbness and headaches.  Psychiatric/Behavioral: Negative for suicidal ideas, hallucinations, behavioral problems, confusion, sleep disturbance, dysphoric mood, decreased concentration and agitation. The patient is not nervous/anxious.        Objective:   Physical Exam  Constitutional: He is oriented to  person, place, and time. He appears well-developed and well-nourished.       Abdominal obesity  HENT:  Head: Normocephalic and atraumatic.  Mouth/Throat: Oropharynx is clear and moist.  Eyes: Conjunctivae and EOM are normal.  Neck: Normal range of motion. Neck supple. No JVD present. No thyromegaly present.  Cardiovascular: Normal rate, regular rhythm and normal heart sounds.   Pulmonary/Chest: Effort normal and breath sounds normal. He has no wheezes. He has no rales.  Abdominal: Soft. Bowel sounds are normal. He exhibits no mass. There is no tenderness. There is no rebound.  Musculoskeletal: Normal range of motion. He exhibits no edema.  Neurological: He is alert and oriented to person, place, and time.  Skin: Skin is warm and dry.  Psychiatric: He has a normal mood and affect.      Assessment & Plan:   HYPERTENSION, BENIGN One elevated reading today, previosuly well controlled.  Will have him check home readings before making changes.   Obesity (BMI 30-39.9) I have addressed  BMI and recommended a low glycemic index diet utilitzign smaller more frequent meals to aid his metabolism.  I have alse recommended that he change his exercise regimen to shorter periods (1 hou0 and more frequent intervals (5 days per week).  20 minutes spent discussing diet .  HYPERLIPIDEMIA Managed with statin,  Labs done at [facility] in October 2012 were within range.  LDL 84, HDL 45 trigs 111.  Glucose intolerance (impaired glucose tolerance) hgba1c was 6.2 in October 2012 at [place] .  Low glycemic index diet again recommended.   A total of 40 minutes was spent in face to fact time with patient discussing diet and exercise in the context of his current metabolic syndrome.   Osteoarthritis of both knees Weight loss , trial of relafen discussed,  Recommended fish oil and glucosamine bid as supplements.     Updated Medication List Outpatient Encounter Prescriptions as of 08/06/2011  Medication Sig Dispense Refill    . aspirin 81 MG EC tablet Take 81 mg by mouth daily.        . calcium carbonate (OS-CAL) 600 MG TABS Take 600 mg by mouth as directed. 3 times a week       . etodolac (LODINE) 400 MG tablet Take 400 mg by mouth 2 (two) times daily.       . fish oil-omega-3 fatty acids 1000 MG capsule Take 1 capsule by mouth daily.        Marland Kitchen lisinopril (PRINIVIL,ZESTRIL) 40 MG tablet Take 1 tablet (40 mg total) by mouth daily.  90 tablet  3  . metoprolol succinate (TOPROL XL) 25 MG 24 hr tablet Take 1 tablet (25 mg total) by mouth daily.  90 tablet  3  . Multiple Vitamin (MULTIVITAMIN) tablet Take 1 tablet by mouth daily.        Marland Kitchen omeprazole (PRILOSEC) 20 MG capsule Take 20 mg by mouth daily.        Marland Kitchen  simvastatin (ZOCOR) 20 MG tablet Take 1.5 tablets (30 mg total) by mouth at bedtime.  135 tablet  3  . vitamin C (ASCORBIC ACID) 250 MG tablet Take 250 mg by mouth 3 (three) times daily.        . vitamin E 600 UNIT capsule Take 600 Units by mouth daily.        . nabumetone (RELAFEN) 750 MG tablet Take 1 tablet (750 mg total) by mouth 2 (two) times daily.  60 tablet  1

## 2011-08-06 NOTE — Patient Instructions (Addendum)
Low glycemic index diet, eating 6 smaller meals daily  Protein  Shakes (EAS Carb Control  Or Atkins ,  Available everywhere,   In  cases at [employer] )  2.5 carbs  Protein bar by Atkins (snack size,  [employer])  At 10 am  Lunch: sandwich on pita bread (Joseph's makes a pita bread and a flat bread , available at [employer] and [employer])  Toufayah  Has a low carb wrap (food Lion,  HT)  [employer]old's makes a lower carb sandwich Thin   Mid day :  Another protein bar,  Or cheese stick, almonds, walnuts, pistachios, pecans, peanuts,  Macadamia nuts  Dinner:  "mean and green:"  Meat, salad, and green veggie : use ranch, vinagrette,  Blue cheese, etc  Evening : Breyer's low carb fudgscle, ice cream (Carb Smart)  ------------------------------------------------------------------------------------------  We are substituting  Nabumetone for etodolac for arthritis pain Consider retrying glucosamine/chondrointin sulfate 2 to 3 times daily for 1 month

## 2011-08-06 NOTE — Assessment & Plan Note (Addendum)
Managed with statin,  Labs done at [facility] in October 2012 were within range.  LDL 84, HDL 45 trigs 111.

## 2011-08-06 NOTE — Assessment & Plan Note (Signed)
Weight loss , trial of relafen discussed,  Recommended fish oil and glucosamine bid as supplements.

## 2011-08-06 NOTE — Assessment & Plan Note (Addendum)
hgba1c was 6.2 in October 2012 at [place] .  Low glycemic index diet again recommended.   A total of 40 minutes was spent in face to fact time with patient discussing diet and exercise in the context of his current metabolic syndrome.

## 2011-08-06 NOTE — Assessment & Plan Note (Signed)
One elevated reading today, previosuly well controlled.  Will have him check home readings before making changes.

## 2011-08-06 NOTE — Assessment & Plan Note (Signed)
I have addressed  BMI and recommended a low glycemic index diet utilitzign smaller more frequent meals to aid his metabolism.  I have alse recommended that he change his exercise regimen to shorter periods (1 hou0 and more frequent intervals (5 days per week).  20 minutes spent discussing diet .

## 2011-08-12 ENCOUNTER — Ambulatory Visit: Payer: Medicare Other | Admitting: Internal Medicine

## 2011-08-12 ENCOUNTER — Telehealth: Payer: Self-pay | Admitting: *Deleted

## 2011-08-12 ENCOUNTER — Ambulatory Visit (INDEPENDENT_AMBULATORY_CARE_PROVIDER_SITE_OTHER): Payer: Medicare Other | Admitting: Internal Medicine

## 2011-08-12 ENCOUNTER — Encounter: Payer: Self-pay | Admitting: Internal Medicine

## 2011-08-12 VITALS — BP 118/76 | HR 91 | Temp 98.9°F | Ht 67.0 in | Wt 200.0 lb

## 2011-08-12 DIAGNOSIS — J329 Chronic sinusitis, unspecified: Secondary | ICD-10-CM

## 2011-08-12 MED ORDER — AMOXICILLIN-POT CLAVULANATE 875-125 MG PO TABS: 1.0000 | ORAL_TABLET | Freq: Two times a day (BID) | ORAL | Status: AC

## 2011-08-12 NOTE — Assessment & Plan Note (Signed)
Symptoms and exam consistent with viral URI with secondary bacterial sinusitis. Will treat with augmentin. Will use tylenol as needed for chills, fever, myalgia. Pt will start claritin during day and benadryl at night.  He will call or email with update on Friday.

## 2011-08-12 NOTE — Progress Notes (Signed)
Subjective:    Patient ID: Mark Farley., male    DOB: 10-21-1945, 66 y.o.   MRN: [id number]  HPI 66YO male presents for acute visit complaining of 4 day h/o fever, chills, nasal congestion and sinus pressure.  Denies cough, chest pain.  Has myalgia and general malaise.  Has been taking OTC mucinex with no improvement.    Outpatient Encounter Prescriptions as of 08/12/2011  Medication Sig Dispense Refill  . aspirin 81 MG EC tablet Take 81 mg by mouth daily.        . calcium carbonate (OS-CAL) 600 MG TABS Take 600 mg by mouth as directed. 3 times a week       . etodolac (LODINE) 400 MG tablet Take 400 mg by mouth 2 (two) times daily.       . fish oil-omega-3 fatty acids 1000 MG capsule Take 1 capsule by mouth daily.        Marland Kitchen lisinopril (PRINIVIL,ZESTRIL) 40 MG tablet Take 1 tablet (40 mg total) by mouth daily.  90 tablet  3  . metoprolol succinate (TOPROL XL) 25 MG 24 hr tablet Take 1 tablet (25 mg total) by mouth daily.  90 tablet  3  . Multiple Vitamin (MULTIVITAMIN) tablet Take 1 tablet by mouth daily.        . nabumetone (RELAFEN) 750 MG tablet Take 1 tablet (750 mg total) by mouth 2 (two) times daily.  60 tablet  1  . omeprazole (PRILOSEC) 20 MG capsule Take 20 mg by mouth daily.        . simvastatin (ZOCOR) 20 MG tablet Take 1.5 tablets (30 mg total) by mouth at bedtime.  135 tablet  3  . vitamin C (ASCORBIC ACID) 250 MG tablet Take 250 mg by mouth 3 (three) times daily.        . vitamin E 600 UNIT capsule Take 600 Units by mouth daily.        Marland Kitchen amoxicillin-clavulanate (AUGMENTIN) 875-125 MG per tablet Take 1 tablet by mouth 2 (two) times daily.  20 tablet  0    Review of Systems  Constitutional: Positive for fever, chills and fatigue. Negative for activity change.  HENT: Positive for congestion, rhinorrhea, postnasal drip and sinus pressure. Negative for hearing loss, ear pain, nosebleeds, sore throat, sneezing, trouble swallowing, neck pain, neck stiffness, voice change,  tinnitus and ear discharge.   Eyes: Negative for discharge, redness, itching and visual disturbance.  Respiratory: Negative for cough, chest tightness, shortness of breath, wheezing and stridor.   Cardiovascular: Negative for chest pain and leg swelling.  Musculoskeletal: Positive for myalgias. Negative for arthralgias.  Skin: Negative for color change and rash.  Neurological: Negative for dizziness, facial asymmetry and headaches.  Psychiatric/Behavioral: Negative for sleep disturbance.   BP 118/76  Pulse 91  Temp(Src) 98.9 F (37.2 C) (Oral)  Ht 5\' 7"  (1.702 m)  Wt 200 lb (90.719 kg)  BMI 31.32 kg/m2  SpO2 97%     Objective:   Physical Exam  Constitutional: He is oriented to person, place, and time. He appears well-developed and well-nourished. No distress.  HENT:  Head: Normocephalic and atraumatic.  Right Ear: External ear normal.  Left Ear: External ear normal.  Nose: Mucosal edema present.  Mouth/Throat: Oropharynx is clear and moist. No oropharyngeal exudate.  Eyes: Conjunctivae and EOM are normal. Pupils are equal, round, and reactive to light. Right eye exhibits no discharge. Left eye exhibits no discharge. No scleral icterus.  Neck: Normal range of motion. Neck  supple. No tracheal deviation present. No thyromegaly present.  Cardiovascular: Normal rate, regular rhythm and normal heart sounds.  Exam reveals no gallop and no friction rub.   No murmur heard. Pulmonary/Chest: Effort normal. No respiratory distress. He has no wheezes. He has rhonchi (scattered). He has no rales. He exhibits no tenderness.  Musculoskeletal: Normal range of motion. He exhibits no edema.  Lymphadenopathy:    He has no cervical adenopathy.  Neurological: He is alert and oriented to person, place, and time. No cranial nerve deficit. Coordination normal.  Skin: Skin is warm and dry. No rash noted. He is not diaphoretic. No erythema. No pallor.  Psychiatric: He has a normal mood and affect. His  behavior is normal. Judgment and thought content normal.          Assessment & Plan:

## 2011-08-12 NOTE — Telephone Encounter (Signed)
Triage Record Num: [id number] Operator: Craig Guess Patient Name: Mark Farley Call Date & Time: 08/12/2011 10:57:08AM Patient Phone: [phone] PCP: Duncan Dull Patient Gender: Male PCP Fax : [phone] Patient DOB: 1946-05-12 Practice Name: [facility] Reason for Call: Caller: Nancy/Spouse; PCP: Duncan Dull; CB#: [phone]. Caller asking if this gentleman can get some antibxs for a sinus infection. Caller reports sxs began yesterday and include fever, chills, ha, facial pain and pressure and generalized body aches. Caller advised he should be seen for eval of sxs. RN unable to access appt schedule, RN spoke with Zella Ball at appt desk. RN then spoke with Morrie Sheldon, info given. Appt scheduled for today, 3/13 Wed at 16:15 with Dr Dan Humphreys. Wife advised and is agreeable. Protocol(s) Used: Office Note Recommended Outcome per Protocol: Information Noted and Sent to Office Reason for Outcome: Caller information to office Care Advice: ~ 08/12/2011 11:09:10AM Page 1 of 1 [email or web address]

## 2011-08-12 NOTE — Patient Instructions (Signed)
Try starting Claritin during the daytime and Benadryl at night to help with nasal secretions.  Use Tylenol Extra Strength up to 2 pills every 8hr as needed for fever, chills.

## 2011-08-14 ENCOUNTER — Encounter: Payer: Self-pay | Admitting: Internal Medicine

## 2011-08-31 ENCOUNTER — Other Ambulatory Visit: Payer: Self-pay | Admitting: Internal Medicine

## 2011-08-31 MED ORDER — TRAMADOL HCL 50 MG PO TABS: 50.0000 mg | ORAL_TABLET | Freq: Four times a day (QID) | ORAL | Status: AC | PRN

## 2011-10-03 ENCOUNTER — Other Ambulatory Visit: Payer: Self-pay | Admitting: Internal Medicine

## 2011-10-12 ENCOUNTER — Other Ambulatory Visit: Payer: Self-pay | Admitting: Cardiovascular Disease

## 2011-10-12 MED ORDER — METOPROLOL SUCCINATE ER 25 MG PO TB24: 25.0000 mg | ORAL_TABLET | Freq: Every day | ORAL | Status: DC

## 2011-10-12 NOTE — Telephone Encounter (Signed)
Refilled Metoprolol

## 2011-10-12 NOTE — Telephone Encounter (Signed)
Needs 90 day script written. Pt will pick up when ready. Has to take to [facility]

## 2011-10-15 ENCOUNTER — Other Ambulatory Visit: Payer: Self-pay | Admitting: Cardiovascular Disease

## 2012-01-13 ENCOUNTER — Other Ambulatory Visit: Payer: Self-pay | Admitting: Cardiovascular Disease

## 2012-01-13 MED ORDER — LISINOPRIL 40 MG PO TABS: 40.0000 mg | ORAL_TABLET | Freq: Every day | ORAL | Status: DC

## 2012-01-13 MED ORDER — SIMVASTATIN 20 MG PO TABS: 30.0000 mg | ORAL_TABLET | Freq: Every day | ORAL | Status: DC

## 2012-01-13 NOTE — Telephone Encounter (Signed)
Pt needs 90 day written script. Please call when ready to p/u

## 2012-01-13 NOTE — Telephone Encounter (Signed)
Refilled Simvastatin and Lisinopril.

## 2012-01-14 ENCOUNTER — Other Ambulatory Visit: Payer: Self-pay | Admitting: *Deleted

## 2012-01-14 MED ORDER — SIMVASTATIN 20 MG PO TABS: 30.0000 mg | ORAL_TABLET | Freq: Every day | ORAL | Status: DC

## 2012-01-14 MED ORDER — LISINOPRIL 40 MG PO TABS: 40.0000 mg | ORAL_TABLET | Freq: Every day | ORAL | Status: DC

## 2012-02-08 ENCOUNTER — Ambulatory Visit (INDEPENDENT_AMBULATORY_CARE_PROVIDER_SITE_OTHER): Payer: Medicare Other | Admitting: Internal Medicine

## 2012-02-08 ENCOUNTER — Encounter: Payer: Self-pay | Admitting: Internal Medicine

## 2012-02-08 VITALS — BP 140/80 | HR 70 | Temp 98.6°F | Resp 16 | Wt 199.2 lb

## 2012-02-08 DIAGNOSIS — I10 Essential (primary) hypertension: Secondary | ICD-10-CM

## 2012-02-08 DIAGNOSIS — R232 Flushing: Secondary | ICD-10-CM

## 2012-02-08 DIAGNOSIS — M1990 Unspecified osteoarthritis, unspecified site: Secondary | ICD-10-CM | POA: Insufficient documentation

## 2012-02-08 MED ORDER — NAPROXEN 500 MG PO TABS: 500.0000 mg | ORAL_TABLET | Freq: Two times a day (BID) | ORAL | Status: DC | PRN

## 2012-02-08 NOTE — Assessment & Plan Note (Signed)
Patient instructed on how to check blood sugars with glucometer for next episode and asked to have histhyrid checked at the [place].  If both are unremarkable will need CXR , 5H1AA and colonoscopy.

## 2012-02-08 NOTE — Assessment & Plan Note (Signed)
Well controlled on current regimen.  no changes today.   

## 2012-02-08 NOTE — Progress Notes (Signed)
Patient ID: Mark Marra., male   DOB: 28-Nov-1945, 66 y.o.   MRN: [id number] Patient Active Problem List  Diagnosis  . HYPERLIPIDEMIA  . HYPERTENSION, BENIGN  . PALPITATIONS  . Screening for colon cancer  . Obesity (BMI 30-39.9)  . Dizziness  . Glucose intolerance (impaired glucose tolerance)  . Osteoarthritis of both knees  . Sinusitis  . Flushing reaction  . Osteoarthritis    Subjective:  CC:   Chief Complaint  Patient presents with  . Follow-up    6 month    HPI:   Mark Farleyis a 66 y.o. male who presents for follow up on chronic issues including hypetension and arthritis.  Knee and hand pain.  Not bothered by exercise including daily use of the stairmaster,  Had a period of 2 months of daily severe pain .  No fevers, headaches, or  warm joints.  But right patella very painful. Now resolved.  NO improvement with trials of etodolac and nabumetone.   He has a history of prediabetes   Last a1c was < 6.0  He has had several episodes of hot flushes recently, unaccompanied by diarrhea and chest pain.  He has never checked his blood sugars during any of these  Episodes.   Past Medical History  Diagnosis Date  . History of syncope 2005    Unexplained, myoview negative  . Hypertension   . Hyperlipidemia   . Palpitations 2010    Life Watch with single PVC  . Arthritis     ? Gout  . Iron deficiency anemia   . Glucose intolerance (impaired glucose tolerance)   . Bilateral carpal tunnel syndrome     managed with nocturnal braces x 3 years  . Osteoarthritis of multiple joints   . COPD (chronic obstructive pulmonary disease)     History reviewed. No pertinent past surgical history.       The following portions of the patient's history were reviewed and updated as appropriate: Allergies, current medications, and problem list.    Review of Systems:   12 Pt  review of systems was negative except those addressed in the HPI,     History   Social  History  . Marital Status: Married    Spouse Name: N/A    Number of Children: N/A  . Years of Education: N/A   Occupational History  . Not on file.   Social History Main Topics  . Smoking status: Never Smoker   . Smokeless tobacco: Never Used  . Alcohol Use: 7.0 oz/week    14 drink(s) per week     2 glasses of wine a day  . Drug Use: No  . Sexually Active: Not on file   Other Topics Concern  . Not on file   Social History Narrative   MarriedVery active    Objective:  BP 140/80  Pulse 70  Temp 98.6 F (37 C) (Oral)  Resp 16  Wt 199 lb 4 oz (90.379 kg)  SpO2 94%  General appearance: alert, cooperative and appears stated age Ears: normal TM's and external ear canals both ears Throat: lips, mucosa, and tongue normal; teeth and gums normal Neck: no adenopathy, no carotid bruit, supple, symmetrical, trachea midline and thyroid not enlarged, symmetric, no tenderness/mass/nodules Back: symmetric, no curvature. ROM normal. No CVA tenderness. Lungs: clear to auscultation bilaterally Heart: regular rate and rhythm, S1, S2 normal, no murmur, click, rub or gallop Abdomen: soft, non-tender; bowel sounds normal; no masses,  no organomegaly Pulses:  2+ and symmetric Skin: Skin color, texture, turgor normal. No rashes or lesions Lymph nodes: Cervical, supraclavicular, and axillary nodes normal.  Assessment and Plan:  Flushing reaction Patient instructed on how to check blood sugars with glucometer for next episode and asked to have histhyrid checked at the [place].  If both are unremarkable will need CXR , 5H1AA and colonoscopy.  HYPERTENSION, BENIGN Well controlled on current regimen. no changes today.  Osteoarthritis Normal exam,  Nothing to suggest synovitis or gout,  Trial of naprelan. Marland Kitchen   Updated Medication List Outpatient Encounter Prescriptions as of 02/08/2012  Medication Sig Dispense Refill  . aspirin 81 MG EC tablet Take 81 mg by mouth daily.        Marland Kitchen etodolac (LODINE)  400 MG tablet Take 400 mg by mouth 2 (two) times daily.       . fish oil-omega-3 fatty acids 1000 MG capsule Take 1 capsule by mouth daily.        Marland Kitchen lisinopril (PRINIVIL,ZESTRIL) 40 MG tablet Take 1 tablet (40 mg total) by mouth daily.  90 tablet  5  . metoprolol succinate (TOPROL XL) 25 MG 24 hr tablet Take 1 tablet (25 mg total) by mouth daily.  90 tablet  6  . Multiple Vitamin (MULTIVITAMIN) tablet Take 1 tablet by mouth daily.        Marland Kitchen omeprazole (PRILOSEC) 20 MG capsule Take 20 mg by mouth daily.        . simvastatin (ZOCOR) 20 MG tablet Take 1.5 tablets (30 mg total) by mouth at bedtime.  135 tablet  5  . DISCONTD: calcium carbonate (OS-CAL) 600 MG TABS Take 600 mg by mouth as directed. 3 times a week       . DISCONTD: nabumetone (RELAFEN) 750 MG tablet TAKE 1 TABLET (750 MG TOTAL) BY MOUTH 2 (TWO) TIMES DAILY.  60 tablet  1  . DISCONTD: vitamin C (ASCORBIC ACID) 250 MG tablet Take 250 mg by mouth 3 (three) times daily.        Marland Kitchen DISCONTD: vitamin E 600 UNIT capsule Take 600 Units by mouth daily.           Orders Placed This Encounter  Procedures  . HM DIABETES EYE EXAM    No Follow-up on file.

## 2012-02-08 NOTE — Patient Instructions (Addendum)
Your hot flashes may be due to a low blood sugar or an overactive thyroid.  Check your blood sugar the next time you have an episode and let me know the results.  Please have your [facility] check TSH ,  hgba1c and urine microalb/cr ratio and let her know about the hot flashes  Please send me the results of your last colonoscopy and your next lab draw.  If the Naprelan helps your arthritis  Better than the other NSAIDs we have tried,  Let me know.

## 2012-02-08 NOTE — Assessment & Plan Note (Signed)
Normal exam,  Nothing to suggest synovitis or gout,  Trial of naprelan. Marland Kitchen

## 2012-03-17 ENCOUNTER — Encounter: Payer: Self-pay | Admitting: Cardiovascular Disease

## 2012-03-17 ENCOUNTER — Ambulatory Visit (INDEPENDENT_AMBULATORY_CARE_PROVIDER_SITE_OTHER): Payer: Medicare Other | Admitting: Cardiovascular Disease

## 2012-03-17 VITALS — BP 128/82 | Ht 67.0 in | Wt 199.2 lb

## 2012-03-17 DIAGNOSIS — E785 Hyperlipidemia, unspecified: Secondary | ICD-10-CM

## 2012-03-17 DIAGNOSIS — R002 Palpitations: Secondary | ICD-10-CM

## 2012-03-17 DIAGNOSIS — E669 Obesity, unspecified: Secondary | ICD-10-CM

## 2012-03-17 DIAGNOSIS — I10 Essential (primary) hypertension: Secondary | ICD-10-CM

## 2012-03-17 NOTE — Progress Notes (Signed)
Patient ID: Mark Boyett., male    DOB: 17-Apr-1946, 66 y.o.   MRN: [id number]  HPI Comments: 66 y/o  male with h/o HTN, HL and previous syncope with negative Myoview in 2005. He also has a h/o palpitations and had a Life Watch monitor for several weeks but no events. Monitor only caught a single PVC.  He does have troubles with arthritis, anemia from iron deficiency   Remains very active exercising on elliptical and bike and weights for 2 hours 5x week. No CP or undue dyspnea during exercise. Minimal palpitations. Active at baseline. On his last clinic visit, we held his HCTZ. He has rare episodes of dizziness.  His biggest complaint is cramping in his legs, now in his abdomen with certain maneuvers.   Following cholesterol at [facility] which has improved. Recent lipids TC 160 TG 83 HDL 48 LDL 95 (up from 80). HgBa1c 6.0.     EKG shows normal sinus rhythm with rate of 71 beats per minute beats per minute, nonspecific ST abnormality in anterolateral leads    Outpatient Encounter Prescriptions as of 03/17/2012  Medication Sig Dispense Refill  . aspirin 81 MG EC tablet Take 81 mg by mouth daily.        Marland Kitchen etodolac (LODINE) 400 MG tablet Take 400 mg by mouth 2 (two) times daily.       . fish oil-omega-3 fatty acids 1000 MG capsule Take 1 capsule by mouth daily.        Marland Kitchen lisinopril (PRINIVIL,ZESTRIL) 40 MG tablet Take 1 tablet (40 mg total) by mouth daily.  90 tablet  5  . metoprolol succinate (TOPROL XL) 25 MG 24 hr tablet Take 1 tablet (25 mg total) by mouth daily.  90 tablet  6  . Misc Natural Products (GLUCOSAMINE CHOND COMPLEX/MSM PO) Take by mouth.      . Multiple Vitamin (MULTIVITAMIN) tablet Take 1 tablet by mouth daily.        . naproxen (NAPROSYN) 500 MG tablet Take 1 tablet (500 mg total) by mouth 2 (two) times daily as needed.  30 tablet  0  . omeprazole (PRILOSEC) 20 MG capsule Take 20 mg by mouth daily.        . simvastatin (ZOCOR) 20 MG tablet Take 1.5 tablets (30 mg total) by  mouth at bedtime.  135 tablet  5      Review of Systems  Constitutional: Negative.   HENT: Negative.   Eyes: Negative.   Respiratory: Negative.   Cardiovascular: Negative.   Gastrointestinal: Negative.   Musculoskeletal: Positive for myalgias.  Skin: Negative.   Hematological: Negative.   Psychiatric/Behavioral: Negative.   All other systems reviewed and are negative.    BP 128/82  Ht 5\' 7"  (1.702 m)  Wt 199 lb 4 oz (90.379 kg)  BMI 31.21 kg/m2  Physical Exam  Nursing note and vitals reviewed. Constitutional: He is oriented to person, place, and time. He appears well-developed and well-nourished.  HENT:  Head: Normocephalic.  Nose: Nose normal.  Mouth/Throat: Oropharynx is clear and moist.  Eyes: Conjunctivae normal are normal. Pupils are equal, round, and reactive to light.  Neck: Normal range of motion. Neck supple. No JVD present.  Cardiovascular: Normal rate, regular rhythm, S1 normal, S2 normal, normal heart sounds and intact distal pulses.  Exam reveals no gallop and no friction rub.   No murmur heard. Pulmonary/Chest: Effort normal and breath sounds normal. No respiratory distress. He has no wheezes. He has no rales. He exhibits  no tenderness.  Abdominal: Soft. Bowel sounds are normal. He exhibits no distension. There is no tenderness.  Musculoskeletal: Normal range of motion. He exhibits no edema and no tenderness.  Lymphadenopathy:    He has no cervical adenopathy.  Neurological: He is alert and oriented to person, place, and time. Coordination normal.  Skin: Skin is warm and dry. No rash noted. No erythema.  Psychiatric: He has a normal mood and affect. His behavior is normal. Judgment and thought content normal.           Assessment and Plan

## 2012-03-17 NOTE — Assessment & Plan Note (Signed)
Cholesterol numbers are fantastic. We have suggested that he try to hold his cholesterol pill for several weeks to see if this makes his cramping in his legs and abdomen better. If he has improvement, we could try an alternate statin, if no improvement, we have suggested he restart the medication.

## 2012-03-17 NOTE — Assessment & Plan Note (Signed)
Blood pressure is well controlled on today's visit. No changes made to the medications. 

## 2012-03-17 NOTE — Patient Instructions (Addendum)
You are doing well. Do a trial of a few weeks without simvastatin to see if cramping gets better Call the office if cramping gets better without simvastatin  Please call us if you have new issues that need to be addressed before your next appt.  Your physician wants you to follow-up in: 12 months.  You will receive a reminder letter in the mail two months in advance. If you don't receive a letter, please call our office to schedule the follow-up appointment.

## 2012-03-17 NOTE — Assessment & Plan Note (Signed)
We have encouraged continued exercise, careful diet management in an effort to lose weight. 

## 2012-04-04 ENCOUNTER — Telehealth: Payer: Self-pay | Admitting: Cardiovascular Disease

## 2012-04-04 NOTE — Telephone Encounter (Signed)
Which statin do you want to try? See below

## 2012-04-04 NOTE — Telephone Encounter (Signed)
Pt was told to call office if cramping stops after stopping simvastatin and it has. Pt was told that dr Mariah Milling would have him try another medication but unsure of the name. Pt needs to pick up the script to take to [facility] for refills.

## 2012-04-05 MED ORDER — ROSUVASTATIN CALCIUM 10 MG PO TABS: ORAL_TABLET | ORAL | Status: DC

## 2012-04-05 NOTE — Telephone Encounter (Signed)
Would try Crestor 10 mg daily When first starting, would cut the pill in half for several weeks before increasing to full pill

## 2012-04-05 NOTE — Telephone Encounter (Signed)
Pt was notified, rx printed and left at front desk for him to pick up per his request.

## 2012-05-03 ENCOUNTER — Telehealth: Payer: Self-pay | Admitting: Cardiovascular Disease

## 2012-05-03 NOTE — Telephone Encounter (Signed)
Patient is having problems with BP being elevated for the past month. The patient did have a headache today with some dizziness but is feeling much better now.  The following BP's are:  11/26- 6:18 am 146/92 HR 71 11/27- 7:40 am 151/88 HR 59 12/3- 1:02 pm 123/89 HR 72 12/2- 150/81 HR 58 148/92 HR 56  The patient would like to know if needs to change his medication dose or just continue to take his blood pressure readings. Please advise.

## 2012-05-03 NOTE — Telephone Encounter (Signed)
If blood pressure continues to be elevated, would start amlodipine 5 mg daily Goal systolic less than 140 on a regular basis

## 2012-05-03 NOTE — Telephone Encounter (Signed)
Pt calling with increased BP avg 150/92. Wants to know what he should do to help lower BP

## 2012-05-04 NOTE — Telephone Encounter (Signed)
lmtcb

## 2012-05-06 ENCOUNTER — Other Ambulatory Visit: Payer: Self-pay

## 2012-05-06 MED ORDER — AMLODIPINE BESYLATE 5 MG PO TABS: 5.0000 mg | ORAL_TABLET | Freq: Every day | ORAL | Status: DC

## 2012-05-06 NOTE — Telephone Encounter (Signed)
Pt informed Understanding verb [place] He will call us next week with correct pharmacy to send this to

## 2012-05-06 NOTE — Telephone Encounter (Signed)
[email or web address]

## 2012-05-10 ENCOUNTER — Encounter: Payer: Self-pay | Admitting: Cardiovascular Disease

## 2012-07-16 ENCOUNTER — Other Ambulatory Visit: Payer: Self-pay

## 2012-08-08 ENCOUNTER — Encounter: Payer: Self-pay | Admitting: Internal Medicine

## 2012-08-08 ENCOUNTER — Ambulatory Visit (INDEPENDENT_AMBULATORY_CARE_PROVIDER_SITE_OTHER): Payer: 59 | Admitting: Internal Medicine

## 2012-08-08 VITALS — BP 120/78 | HR 68 | Temp 97.8°F | Resp 16 | Ht 66.25 in | Wt 179.2 lb

## 2012-08-08 DIAGNOSIS — M1990 Unspecified osteoarthritis, unspecified site: Secondary | ICD-10-CM

## 2012-08-08 DIAGNOSIS — I10 Essential (primary) hypertension: Secondary | ICD-10-CM

## 2012-08-08 DIAGNOSIS — R7302 Impaired glucose tolerance (oral): Secondary | ICD-10-CM

## 2012-08-08 DIAGNOSIS — E669 Obesity, unspecified: Secondary | ICD-10-CM

## 2012-08-08 DIAGNOSIS — R7309 Other abnormal glucose: Secondary | ICD-10-CM

## 2012-08-08 LAB — HM DIABETES FOOT EXAM: HM Diabetic Foot Exam: NORMAL

## 2012-08-08 MED ORDER — OMEPRAZOLE 20 MG PO CPDR: 20.0000 mg | DELAYED_RELEASE_CAPSULE | Freq: Every day | ORAL | Status: DC

## 2012-08-08 MED ORDER — CELECOXIB 200 MG PO CAPS: ORAL_CAPSULE | ORAL | Status: DC

## 2012-08-08 NOTE — Assessment & Plan Note (Signed)
Currently controlled with wt loss and low GU diet .  Recent fasting glucose < 110 . A1c 5.9

## 2012-08-08 NOTE — Patient Instructions (Signed)
Changing arthritis medication  To  celebrex.  stop the naprosyn and etodolac, but can continue tyleinol or tramadol if needed.   Return for annual exam  This summer

## 2012-08-08 NOTE — Assessment & Plan Note (Signed)
Resolved with weight loss of over 20 lbs since October.

## 2012-08-08 NOTE — Progress Notes (Signed)
Patient ID: Mark Farley., male   DOB: [birth year], 67 y.o.   MRN: [id number]    Patient Active Problem List  Diagnosis  . HYPERLIPIDEMIA  . HYPERTENSION, BENIGN  . Screening for colon cancer  . Obesity (BMI 30-39.9)  . Dizziness  . Glucose intolerance (impaired glucose tolerance)  . Flushing reaction  . Osteoarthritis    Subjective:  CC:   Chief Complaint  Patient presents with  . Follow-up    6 month    HPI:   Mark Farleyis a 67 y.o. male who presents 6 month follow up on hypertension, hyperlipidemia, and glucose intolerance with obesity .  He has lost 25 lbs since October, when his wife Mark Farley was diagnosed with DM and they both adopted a low GI diet.    Had developed severe hamstring and abdominal cramps so Dr. Mariah Farley suspended his statin therapy with simvastatin stopped.  IN the interim his untreated LDL was 164 (previously 91).  He has  Been taking crestor per dr Mark Farley,  Leg cramps have not recurred.    Cc: right sided paraspinous muscle spasm aggravated by prolonged standing. Hands and knees have been aching .  Has had a prior rheumatology evaluation.      Past Medical History  Diagnosis Date  . History of syncope 2005    Unexplained, myoview negative  . Hypertension   . Hyperlipidemia   . Palpitations 2010    Life Watch with single PVC  . Arthritis     ? Gout  . Iron deficiency anemia   . Glucose intolerance (impaired glucose tolerance)   . Bilateral carpal tunnel syndrome     managed with nocturnal braces x 3 years  . Osteoarthritis of multiple joints   . COPD (chronic obstructive pulmonary disease)     History reviewed. No pertinent past surgical history.     The following portions of the patient's history were reviewed and updated as appropriate: Allergies, current medications, and problem list.    Review of Systems:   Patient denies headache, fevers, malaise, unintentional weight loss, skin rash, eye pain, sinus congestion and  sinus pain, sore throat, dysphagia,  hemoptysis , cough, dyspnea, wheezing, chest pain, palpitations, orthopnea, edema, abdominal pain, nausea, melena, diarrhea, constipation, flank pain, dysuria, hematuria, urinary  Frequency, nocturia, numbness, tingling, seizures,  Focal weakness, Loss of consciousness,  Tremor, insomnia, depression, anxiety, and suicidal ideation.     History   Social History  . Marital Status: Married    Spouse Name: N/A    Number of Children: N/A  . Years of Education: N/A   Occupational History  . Not on file.   Social History Main Topics  . Smoking status: Never Smoker   . Smokeless tobacco: Never Used  . Alcohol Use: 7.0 oz/week    14 drink(s) per week     Comment: 2 glasses of wine a day  . Drug Use: No  . Sexually Active: Not on file   Other Topics Concern  . Not on file   Social History Narrative   Married   Very active    Objective:  BP 120/78  Pulse 68  Temp(Src) 97.8 F (36.6 C) (Oral)  Resp 16  Ht 5' 6.25" (1.683 m)  Wt 179 lb 4 oz (81.307 kg)  BMI 28.71 kg/m2  SpO2 98%  General appearance: alert, cooperative and appears stated age Ears: normal TM's and external ear canals both ears Throat: lips, mucosa, and tongue normal; teeth and gums normal  Neck: no adenopathy, no carotid bruit, supple, symmetrical, trachea midline and thyroid not enlarged, symmetric, no tenderness/mass/nodules Back: symmetric, no curvature. ROM normal. No CVA tenderness. Lungs: clear to auscultation bilaterally Heart: regular rate and rhythm, S1, S2 normal, no murmur, click, rub or gallop Abdomen: soft, non-tender; bowel sounds normal; no masses,  no organomegaly Pulses: 2+ and symmetric Skin: Skin color, texture, turgor normal. No rashes or lesions Lymph nodes: Cervical, supraclavicular, and axillary nodes normal. Foot exam:  Nails are well trimmed,  No callouses,  Sensation intact to microfilament  Assessment and Plan:  Obesity (BMI 30-39.9) Resolved  with weight loss of over 20 lbs since October.  HYPERTENSION, BENIGN Well controlled on current regimen. Renal function stable, no changes today.  HYPERLIPIDEMIA Now managed with Crestor due to excessive myalgias with simvastatin.   Osteoarthritis Failed trial of etodolac and nabumetone.  Trial of celebrex 200 mg bid.   Glucose intolerance (impaired glucose tolerance) Currently controlled with wt loss and low GU diet .  Recent fasting glucose < 110 . A1c 5.9   Updated Medication List Outpatient Encounter Prescriptions as of 08/08/2012  Medication Sig Dispense Refill  . amLODipine (NORVASC) 5 MG tablet Take 1 tablet (5 mg total) by mouth daily.  30 tablet  3  . aspirin 81 MG EC tablet Take 81 mg by mouth daily.        . fish oil-omega-3 fatty acids 1000 MG capsule Take 1 capsule by mouth daily.        Marland Kitchen lisinopril (PRINIVIL,ZESTRIL) 40 MG tablet Take 1 tablet (40 mg total) by mouth daily.  90 tablet  5  . metoprolol succinate (TOPROL XL) 25 MG 24 hr tablet Take 1 tablet (25 mg total) by mouth daily.  90 tablet  6  . Misc Natural Products (GLUCOSAMINE CHOND COMPLEX/MSM PO) Take by mouth.      . Multiple Vitamin (MULTIVITAMIN) tablet Take 1 tablet by mouth daily.        Marland Kitchen omeprazole (PRILOSEC) 20 MG capsule Take 1 capsule (20 mg total) by mouth daily.  90 capsule  3  . rosuvastatin (CRESTOR) 10 MG tablet Take one-half (1/2) tablet by mouth daily  for a few weeks and then increase to 1 tablet daily if tolerated.  90 tablet  1  . [DISCONTINUED] etodolac (LODINE) 400 MG tablet Take 400 mg by mouth 2 (two) times daily.       . [DISCONTINUED] naproxen (NAPROSYN) 500 MG tablet Take 1 tablet (500 mg total) by mouth 2 (two) times daily as needed.  30 tablet  0  . [DISCONTINUED] omeprazole (PRILOSEC) 20 MG capsule Take 20 mg by mouth daily.        . celecoxib (CELEBREX) 200 MG capsule As needed for joint pain  180 capsule  3   No facility-administered encounter medications on file as of  08/08/2012.     Orders Placed This Encounter  Procedures  . HM DIABETES FOOT EXAM  . HM COLONOSCOPY    No Follow-up on file.

## 2012-08-08 NOTE — Assessment & Plan Note (Signed)
Failed trial of etodolac and nabumetone.  Trial of celebrex 200 mg bid.

## 2012-08-08 NOTE — Assessment & Plan Note (Signed)
Well controlled on current regimen. Renal function stable, no changes today. 

## 2012-08-08 NOTE — Assessment & Plan Note (Signed)
Now managed with Crestor due to excessive myalgias with simvastatin.

## 2012-09-06 ENCOUNTER — Encounter: Payer: Self-pay | Admitting: Internal Medicine

## 2012-09-06 MED ORDER — HYDROCODONE-ACETAMINOPHEN 5-325 MG PO TABS
1.0000 | ORAL_TABLET | Freq: Four times a day (QID) | ORAL | Status: DC | PRN
Start: 2012-09-06 — End: 2016-09-04

## 2012-09-07 ENCOUNTER — Encounter: Payer: Self-pay | Admitting: Internal Medicine

## 2012-09-07 ENCOUNTER — Ambulatory Visit (INDEPENDENT_AMBULATORY_CARE_PROVIDER_SITE_OTHER)
Admission: RE | Admit: 2012-09-07 | Discharge: 2012-09-07 | Disposition: A | Discharge status: Home / Self Care | Payer: 59 | Source: Ambulatory Visit | Attending: Internal Medicine | Admitting: Internal Medicine

## 2012-09-07 ENCOUNTER — Other Ambulatory Visit: Payer: Self-pay | Admitting: Internal Medicine

## 2012-09-07 DIAGNOSIS — M25559 Pain in unspecified hip: Secondary | ICD-10-CM

## 2012-09-07 DIAGNOSIS — M25552 Pain in left hip: Secondary | ICD-10-CM

## 2012-09-30 ENCOUNTER — Other Ambulatory Visit: Payer: Self-pay | Admitting: Internal Medicine

## 2012-09-30 DIAGNOSIS — M1990 Unspecified osteoarthritis, unspecified site: Secondary | ICD-10-CM

## 2012-09-30 MED ORDER — CELECOXIB 200 MG PO CAPS: 200.0000 mg | ORAL_CAPSULE | Freq: Two times a day (BID) | ORAL | Status: DC

## 2012-10-12 ENCOUNTER — Encounter: Payer: Self-pay | Admitting: Cardiovascular Disease

## 2012-10-25 ENCOUNTER — Encounter: Payer: Self-pay | Admitting: Internal Medicine

## 2012-10-26 ENCOUNTER — Ambulatory Visit (INDEPENDENT_AMBULATORY_CARE_PROVIDER_SITE_OTHER): Payer: 59

## 2012-10-26 VITALS — BP 145/101

## 2012-10-26 DIAGNOSIS — I10 Essential (primary) hypertension: Secondary | ICD-10-CM

## 2012-10-26 MED ORDER — LISINOPRIL 10 MG PO TABS: 10.0000 mg | ORAL_TABLET | Freq: Every day | ORAL | Status: DC

## 2012-10-26 NOTE — Progress Notes (Signed)
Pt walked in office requesting we check hi BP machine with ours He has recently lost 30 pounds via diet and exercise Was originally taking lisinopril 40 mg daily He began to experience significant dizziness with position change at home/gym. He is very active in yard, gym, etc daily We advised him to decrease lisinopril dose to 20 mg daily since he was having dizzy spells and may need less medication given recent weight loss I had asked him to monitor BP at home, checking BP at certain times, when sitting and standing, etc He has recorded this over the last several weeks and brings these readings in today  They are as follows: Sitting:108/63-148/80 Standing:104/66-144/88 HR=60-86 BPM  He is still c/o dizziness with position changes He confirms compliance with metoprolol 25 mg daily and states PVCs/palpitations are very minimal on this medication  I advised to try decreasing lisinopril further to 10 mg daily to see if this helps dizziness.  I explained some of his BP readings are still a little high, but these are first thing in am, prior to taking meds. He will try making this change and continue to monitor BP and HR He will drop off readings next week

## 2012-10-26 NOTE — Patient Instructions (Addendum)
Decrease lisinopril to 10 mg daily Continue to log BPs and HRs and drop off readings to [place] next week

## 2012-10-27 ENCOUNTER — Other Ambulatory Visit: Payer: Self-pay | Admitting: Internal Medicine

## 2012-10-27 MED ORDER — DICLOFENAC SODIUM 1 % TD GEL: 2.0000 g | Freq: Four times a day (QID) | TRANSDERMAL | Status: DC

## 2012-10-27 NOTE — Telephone Encounter (Signed)
Called patient and notified script ready for pickup.

## 2012-11-08 ENCOUNTER — Ambulatory Visit (INDEPENDENT_AMBULATORY_CARE_PROVIDER_SITE_OTHER): Payer: 59 | Admitting: Internal Medicine

## 2012-11-08 ENCOUNTER — Encounter: Payer: Self-pay | Admitting: Internal Medicine

## 2012-11-08 VITALS — BP 132/78 | HR 61 | Temp 97.8°F | Resp 16 | Ht 66.0 in | Wt 171.8 lb

## 2012-11-08 DIAGNOSIS — Z1211 Encounter for screening for malignant neoplasm of colon: Secondary | ICD-10-CM

## 2012-11-08 DIAGNOSIS — Z0000 Encounter for general adult medical examination without abnormal findings: Secondary | ICD-10-CM

## 2012-11-08 DIAGNOSIS — E785 Hyperlipidemia, unspecified: Secondary | ICD-10-CM

## 2012-11-08 DIAGNOSIS — R232 Flushing: Secondary | ICD-10-CM

## 2012-11-08 DIAGNOSIS — D509 Iron deficiency anemia, unspecified: Secondary | ICD-10-CM | POA: Insufficient documentation

## 2012-11-08 DIAGNOSIS — R7309 Other abnormal glucose: Secondary | ICD-10-CM

## 2012-11-08 DIAGNOSIS — R7302 Impaired glucose tolerance (oral): Secondary | ICD-10-CM

## 2012-11-08 DIAGNOSIS — D649 Anemia, unspecified: Secondary | ICD-10-CM

## 2012-11-08 LAB — VITAMIN B12: Vitamin B-12: 317 pg/mL (ref 211–911)

## 2012-11-08 LAB — HEMOGLOBIN A1C: Hgb A1c MFr Bld: 5.9 % (ref 4.6–6.5)

## 2012-11-08 NOTE — Assessment & Plan Note (Signed)
Annual male exam was done including testicular and prostate exam. PSA is normal .  Colon ca screening was reviewed and options given.   

## 2012-11-08 NOTE — Assessment & Plan Note (Addendum)
Improved.  Total chol 119, LDL 53 HDL 64 trigs 48.  LFTs normal  . Continue Crestor.

## 2012-11-08 NOTE — Assessment & Plan Note (Signed)
Normocytic, mild ,  With mild leukopenia now noted and normal platelet counts.  checkinf B12 iron and folate stores

## 2012-11-08 NOTE — Assessment & Plan Note (Addendum)
Repeat a1c remains steady at 5.9 .  He is frustrated that it is not lower despite wt loss of 30 lbs .  Encouragement and relevance discussed.

## 2012-11-08 NOTE — Progress Notes (Signed)
Patient ID: Mark Farley., male   DOB: March 12, 1946, 67 y.o.   MRN: [id number]  The patient is here for annual Medicare wellness examination and management of other chronic and acute problems. Has annual derm exam with [facility],.  [facility]   ,   The risk factors are reflected in the social history.  The roster of all physicians providing medical care to patient - is listed in the Snapshot section of the chart.  Activities of daily living:  The patient is 100% independent in all ADLs: dressing, toileting, feeding as well as independent mobility  Home safety : The patient has smoke detectors in the home. They wear seatbelts.  There are no firearms at home. There is no violence in the home.   There is no risks for hepatitis, STDs or HIV. There is no   history of blood transfusion. They have no travel history to infectious disease endemic areas of the world.  The patient has seen their dentist in the last six month. They have seen their eye doctor in the last year. They admit to slight hearing difficulty with regard to whispered voices and some television programs.  They have deferred audiologic testing in the last year.  They do not  have excessive sun exposure. Discussed the need for sun protection: hats, long sleeves and use of sunscreen if there is significant sun exposure.   Diet: the importance of a healthy diet is discussed. They do have a healthy diet.  The benefits of regular aerobic exercise were discussed. She walks 4 times per week ,  20 minutes.   Depression screen: there are no signs or vegative symptoms of depression- irritability, change in appetite, anhedonia, sadness/tearfullness.  Cognitive assessment: the patient manages all their financial and personal affairs and is actively engaged. They could relate day,date,year and events; recalled 2/3 objects at 3 minutes; performed clock-face test normally.  The following portions of the patient's history  were reviewed and updated as appropriate: allergies, current medications, past family history, past medical history,  past surgical history, past social history  and problem list.  Visual acuity was not assessed per patient preference since she has regular follow up with her ophthalmologist. Hearing and body mass index were assessed and reviewed.   During the course of the visit the patient was educated and counseled about appropriate screening and preventive services including : fall prevention , diabetes screening, nutrition counseling, colorectal cancer screening, and recommended immunizations.    Objective:  BP 132/78  Pulse 61  Temp(Src) 97.8 F (36.6 C) (Oral)  Resp 16  Ht 5\' 6"  (1.676 m)  Wt 171 lb 12 oz (77.905 kg)  BMI 27.73 kg/m2  SpO2 98%  General Appearance:    Alert, cooperative, no distress, appears stated age  Head:    Normocephalic, without obvious abnormality, atraumatic  Eyes:    PERRL, conjunctiva/corneas clear, EOM's intact, fundi    benign, both eyes       Ears:    Normal TM's and external ear canals, both ears  Nose:   Nares normal, septum midline, mucosa normal, no drainage   or sinus tenderness  Throat:   Lips, mucosa, and tongue normal; teeth and gums normal  Neck:   Supple, symmetrical, trachea midline, no adenopathy;       thyroid:  No enlargement/tenderness/nodules; no carotid   bruit or JVD  Back:     Symmetric, no curvature, ROM normal, no CVA tenderness  Lungs:  Clear to auscultation bilaterally, respirations unlabored  Chest wall:    No tenderness or deformity  Heart:    Regular rate and rhythm, S1 and S2 normal, no murmur, rub   or gallop  Abdomen:     Soft, non-tender, bowel sounds active all four quadrants,    no masses, no organomegaly  Genitalia:    Normal male without lesion, discharge or tenderness  Rectal:    Normal tone, normal prostate, no masses or tenderness;   guaiac negative stool  Extremities:   Extremities normal, atraumatic,  no cyanosis or edema  Pulses:   2+ and symmetric all extremities  Skin:   Skin color, texture, turgor normal, no rashes or lesions  Lymph nodes:   Cervical, supraclavicular, and axillary nodes normal  Neurologic:   CNII-XII intact. Normal strength, sensation and reflexes      throughout    Assessment and Plan  Glucose intolerance (impaired glucose tolerance) Repeat a1c remains steady at 5.9 .  He is frustrated that it is not lower despite wt loss of 30 lbs .  Encouragement and relevance discussed.   Routine general medical examination at a health care facility Annual male exam was done including testicular and prostate exam. PSA is normal .  Colon ca screening was reviewed and options given.    HYPERLIPIDEMIA Improved.  Total chol 119, LDL 53 HDL 64 trigs 48.  LFTs normal  . Continue Crestor.  Anemia Normocytic, mild ,  With mild leukopenia now noted and normal platelet counts.  checkinf B12 iron and folate stores   Updated Medication List Outpatient Encounter Prescriptions as of 11/08/2012  Medication Sig Dispense Refill  . aspirin 81 MG EC tablet Take 81 mg by mouth daily.        . celecoxib (CELEBREX) 200 MG capsule Take 1 capsule (200 mg total) by mouth 2 (two) times daily. As needed for joint pain  180 capsule  3  . diclofenac sodium (VOLTAREN) 1 % GEL Apply 2 g topically 4 (four) times daily.  300 g  3  . fish oil-omega-3 fatty acids 1000 MG capsule Take 1 capsule by mouth daily.        Marland Kitchen lisinopril (PRINIVIL,ZESTRIL) 10 MG tablet Take 1 tablet (10 mg total) by mouth daily.  30 tablet  3  . metoprolol succinate (TOPROL XL) 25 MG 24 hr tablet Take 1 tablet (25 mg total) by mouth daily.  90 tablet  6  . Misc Natural Products (GLUCOSAMINE CHOND COMPLEX/MSM PO) Take by mouth.      . Multiple Vitamin (MULTIVITAMIN) tablet Take 1 tablet by mouth daily.        Marland Kitchen omeprazole (PRILOSEC) 20 MG capsule Take 1 capsule (20 mg total) by mouth daily.  90 capsule  3  . rosuvastatin (CRESTOR)  10 MG tablet Take one-half (1/2) tablet by mouth daily  for a few weeks and then increase to 1 tablet daily if tolerated.  90 tablet  1  . HYDROcodone-acetaminophen (NORCO/VICODIN) 5-325 MG per tablet Take 1 tablet by mouth every 6 (six) hours as needed for pain.  60 tablet  0   No facility-administered encounter medications on file as of 11/08/2012.

## 2012-11-08 NOTE — Patient Instructions (Addendum)
Your exam is normal today.  You are in great health   I am checking your iron, b12 and folate stores today as well as your A1c

## 2012-11-09 LAB — IRON AND TIBC: %SAT: 10 % — ABNORMAL LOW (ref 20–55)

## 2012-11-09 LAB — FOLATE RBC: RBC Folate: 895 ng/mL (ref >=366)

## 2012-11-10 ENCOUNTER — Encounter: Payer: Self-pay | Admitting: Internal Medicine

## 2012-11-11 ENCOUNTER — Other Ambulatory Visit (INDEPENDENT_AMBULATORY_CARE_PROVIDER_SITE_OTHER): Payer: 59

## 2012-11-11 ENCOUNTER — Other Ambulatory Visit: Payer: Self-pay | Admitting: *Deleted

## 2012-11-11 DIAGNOSIS — Z1211 Encounter for screening for malignant neoplasm of colon: Secondary | ICD-10-CM

## 2012-11-11 LAB — FECAL OCCULT BLOOD, IMMUNOCHEMICAL: Fecal Occult Bld: NEGATIVE

## 2012-11-13 ENCOUNTER — Encounter: Payer: Self-pay | Admitting: Internal Medicine

## 2012-12-01 ENCOUNTER — Encounter: Payer: Self-pay | Admitting: Internal Medicine

## 2012-12-01 DIAGNOSIS — D649 Anemia, unspecified: Secondary | ICD-10-CM

## 2012-12-07 ENCOUNTER — Other Ambulatory Visit (INDEPENDENT_AMBULATORY_CARE_PROVIDER_SITE_OTHER): Payer: 59

## 2012-12-07 DIAGNOSIS — D649 Anemia, unspecified: Secondary | ICD-10-CM

## 2012-12-07 DIAGNOSIS — D509 Iron deficiency anemia, unspecified: Secondary | ICD-10-CM

## 2012-12-07 LAB — CBC WITH DIFFERENTIAL/PLATELET
Basophils Absolute: 0 10*3/uL (ref 0.0–0.1)
Eosinophils Absolute: 0.2 10*3/uL (ref 0.0–0.7)
Hemoglobin: 10.4 g/dL — ABNORMAL LOW (ref 13.0–17.0)
Lymphocytes Relative: 32.5 % (ref 12.0–46.0)
MCHC: 33.4 g/dL (ref 30.0–36.0)
Monocytes Relative: 7.8 % (ref 3.0–12.0)
Neutro Abs: 2.9 10*3/uL (ref 1.4–7.7)
Neutrophils Relative %: 55.9 % (ref 43.0–77.0)
Platelets: 190 10*3/uL (ref 150.0–400.0)
RDW: 15.5 % — ABNORMAL HIGH (ref 11.5–14.6)

## 2012-12-07 LAB — IRON AND TIBC
Iron: 30 ug/dL — ABNORMAL LOW (ref 42–165)
TIBC: 409 ug/dL (ref 215–435)
UIBC: 379 ug/dL (ref 125–400)

## 2012-12-07 LAB — FERRITIN: Ferritin: 5.5 ng/mL — ABNORMAL LOW (ref 22.0–322.0)

## 2012-12-08 ENCOUNTER — Encounter: Payer: Self-pay | Admitting: Internal Medicine

## 2012-12-08 MED ORDER — IRON-VIT C-VIT B12-FOLIC ACID 100-250-0.025-1 MG PO TABS: 1.0000 | ORAL_TABLET | Freq: Every day | ORAL | Status: DC

## 2012-12-08 NOTE — Assessment & Plan Note (Signed)
Iron supplement,  IFOB ordered,  Last colonoscopy 2011.

## 2012-12-08 NOTE — Addendum Note (Signed)
Addended by: Sherlene Shams on: 12/08/2012 07:18 AM   Modules accepted: Orders

## 2012-12-14 ENCOUNTER — Other Ambulatory Visit: Payer: Self-pay | Admitting: Internal Medicine

## 2012-12-14 MED ORDER — FERROUS SULFATE 325 (65 FE) MG PO TABS: 325.0000 mg | ORAL_TABLET | Freq: Every day | ORAL | Status: DC

## 2012-12-18 ENCOUNTER — Encounter: Payer: Self-pay | Admitting: Internal Medicine

## 2013-01-05 ENCOUNTER — Encounter: Payer: Self-pay | Admitting: Internal Medicine

## 2013-01-11 ENCOUNTER — Encounter: Payer: Self-pay | Admitting: Internal Medicine

## 2013-01-16 MED ORDER — TRAMADOL HCL 50 MG PO TABS: 50.0000 mg | ORAL_TABLET | Freq: Three times a day (TID) | ORAL | Status: DC | PRN

## 2013-01-24 ENCOUNTER — Telehealth: Payer: Self-pay | Admitting: Internal Medicine

## 2013-01-24 NOTE — Telephone Encounter (Signed)
His letter is ready for pick up

## 2013-02-06 NOTE — Telephone Encounter (Signed)
Patient notified and placed up front.

## 2013-02-07 NOTE — Telephone Encounter (Signed)
Records available.

## 2013-03-17 ENCOUNTER — Ambulatory Visit (INDEPENDENT_AMBULATORY_CARE_PROVIDER_SITE_OTHER): Payer: 59 | Admitting: Cardiovascular Disease

## 2013-03-17 ENCOUNTER — Encounter: Payer: Self-pay | Admitting: Cardiovascular Disease

## 2013-03-17 VITALS — BP 122/80 | HR 58 | Ht 66.0 in | Wt 173.8 lb

## 2013-03-17 DIAGNOSIS — I10 Essential (primary) hypertension: Secondary | ICD-10-CM

## 2013-03-17 DIAGNOSIS — E785 Hyperlipidemia, unspecified: Secondary | ICD-10-CM

## 2013-03-17 DIAGNOSIS — R7309 Other abnormal glucose: Secondary | ICD-10-CM

## 2013-03-17 DIAGNOSIS — R7302 Impaired glucose tolerance (oral): Secondary | ICD-10-CM

## 2013-03-17 NOTE — Assessment & Plan Note (Signed)
Encouraged him to continue his diet and exercise. Weight significantly improved. Hemoglobin A1c 5.9

## 2013-03-17 NOTE — Assessment & Plan Note (Signed)
Blood pressure is well controlled on today's visit. No changes made to the medications. 

## 2013-03-17 NOTE — Progress Notes (Signed)
Patient ID: Mark Portela., male    DOB: 1946-01-17, 67 y.o.   MRN: [id number]  HPI Comments: 67 y/o  male with h/o HTN, HL and previous syncope with negative Myoview in 2005. He also has a h/o palpitations and had a Life Watch monitor for several weeks but no events. Monitor only caught a single PVC.  He does have troubles with arthritis, anemia from iron deficiency  Since his last clinic visit, he has lost 30 pounds. Blood pressure was running low and he decreased his lisinopril down from 40 mg to 10 mg. He continues to have occasional leg cramping, worse in the summer. No significant cramping in the past month. Most recent cholesterol from the [place] may 2014 was 119. This was on Crestor 10 mg daily. Last hemoglobin A1c 5.9. Occasional cramping in his abdomen   Remains very active exercising on elliptical and bike and weights for 2 hours 5x week. No CP or undue dyspnea during exercise. Minimal palpitations. Active at baseline.   EKG shows normal sinus rhythm with rate of 58 beats per minute beats per minute, no significant ST or T wave changes    Outpatient Encounter Prescriptions as of 03/17/2013  Medication Sig Dispense Refill  . aspirin 81 MG EC tablet Take 81 mg by mouth daily.        . celecoxib (CELEBREX) 200 MG capsule Take 1 capsule (200 mg total) by mouth 2 (two) times daily. As needed for joint pain  180 capsule  3  . diclofenac sodium (VOLTAREN) 1 % GEL Apply 2 g topically 4 (four) times daily.  300 g  3  . ferrous sulfate 325 (65 FE) MG tablet Take 1 tablet (325 mg total) by mouth daily with breakfast.  90 tablet  3  . fish oil-omega-3 fatty acids 1000 MG capsule Take 1 capsule by mouth daily.        Marland Kitchen HYDROcodone-acetaminophen (NORCO/VICODIN) 5-325 MG per tablet Take 1 tablet by mouth every 6 (six) hours as needed for pain.  60 tablet  0  . Iron-Vit C-Vit B12-Folic Acid (FE C PLUS) 100-250-0.025-1 MG TABS Take 1 tablet by mouth daily with lunch.  90 each  0  . lisinopril  (PRINIVIL,ZESTRIL) 10 MG tablet Take 1 tablet (10 mg total) by mouth daily.  30 tablet  3  . metoprolol succinate (TOPROL XL) 25 MG 24 hr tablet Take 1 tablet (25 mg total) by mouth daily.  90 tablet  6  . Misc Natural Products (GLUCOSAMINE CHOND COMPLEX/MSM PO) Take by mouth.      . Multiple Vitamin (MULTIVITAMIN) tablet Take 1 tablet by mouth daily.        Marland Kitchen omeprazole (PRILOSEC) 20 MG capsule Take 1 capsule (20 mg total) by mouth daily.  90 capsule  3  . rosuvastatin (CRESTOR) 10 MG tablet Take one-half (1/2) tablet by mouth daily  for a few weeks and then increase to 1 tablet daily if tolerated.  90 tablet  1  . traMADol (ULTRAM) 50 MG tablet Take 1 tablet (50 mg total) by mouth every 8 (eight) hours as needed for pain.  90 tablet  4   No facility-administered encounter medications on file as of 03/17/2013.      Review of Systems  Constitutional: Negative.   HENT: Negative.   Eyes: Negative.   Respiratory: Negative.   Cardiovascular: Negative.   Gastrointestinal: Negative.   Endocrine: Negative.   Musculoskeletal: Positive for myalgias.  Skin: Negative.   Allergic/Immunologic: Negative.  Neurological: Negative.   Hematological: Negative.   Psychiatric/Behavioral: Negative.   All other systems reviewed and are negative.   BP 122/80  Pulse 58  Ht 5\' 6"  (1.676 m)  Wt 173 lb 12 oz (78.812 kg)  BMI 28.06 kg/m2  Physical Exam  Nursing note and vitals reviewed. Constitutional: He is oriented to person, place, and time. He appears well-developed and well-nourished.  HENT:  Head: Normocephalic.  Nose: Nose normal.  Mouth/Throat: Oropharynx is clear and moist.  Eyes: Conjunctivae are normal. Pupils are equal, round, and reactive to light.  Neck: Normal range of motion. Neck supple. No JVD present.  Cardiovascular: Normal rate, regular rhythm, S1 normal, S2 normal, normal heart sounds and intact distal pulses.  Exam reveals no gallop and no friction rub.   No murmur  heard. Pulmonary/Chest: Effort normal and breath sounds normal. No respiratory distress. He has no wheezes. He has no rales. He exhibits no tenderness.  Abdominal: Soft. Bowel sounds are normal. He exhibits no distension. There is no tenderness.  Musculoskeletal: Normal range of motion. He exhibits no edema and no tenderness.  Lymphadenopathy:    He has no cervical adenopathy.  Neurological: He is alert and oriented to person, place, and time. Coordination normal.  Skin: Skin is warm and dry. No rash noted. No erythema.  Psychiatric: He has a normal mood and affect. His behavior is normal. Judgment and thought content normal.      Assessment and Plan

## 2013-03-17 NOTE — Assessment & Plan Note (Signed)
Suggested he continue on Crestor 10 mg daily. If cramps get worse, could do a trial hold of the Crestor, restart at 5 mg.

## 2013-03-17 NOTE — Patient Instructions (Signed)
You are doing well.  If you start having significant leg cramps, Consider cutting the crestor in 1/2  Last total cholesterol was 119 in 09/2012  Please call us if you have new issues that need to be addressed before your next appt.  Your physician wants you to follow-up in: 12 months.  You will receive a reminder letter in the mail two months in advance. If you don't receive a letter, please call our office to schedule the follow-up appointment.

## 2013-04-02 ENCOUNTER — Encounter: Payer: Self-pay | Admitting: Cardiovascular Disease

## 2013-04-06 ENCOUNTER — Other Ambulatory Visit: Payer: Self-pay

## 2013-04-22 ENCOUNTER — Encounter: Payer: Self-pay | Admitting: Internal Medicine

## 2013-06-01 DIAGNOSIS — M19031 Primary osteoarthritis, right wrist: Secondary | ICD-10-CM | POA: Insufficient documentation

## 2013-09-27 ENCOUNTER — Encounter: Payer: Self-pay | Admitting: Internal Medicine

## 2013-09-28 ENCOUNTER — Encounter: Payer: Self-pay | Admitting: Internal Medicine

## 2013-09-28 MED ORDER — OMEPRAZOLE 20 MG PO CPDR: 20.0000 mg | DELAYED_RELEASE_CAPSULE | Freq: Every day | ORAL | Status: DC

## 2013-09-28 NOTE — Telephone Encounter (Signed)
Ok to fill,  rx printed  . Patient needs to pick up this rx on Friday to take to his [facility]

## 2013-09-28 NOTE — Telephone Encounter (Signed)
Ok refilling for a year? Last appt 11/08/12 and no future appts scheduled

## 2013-10-13 LAB — CBC AND DIFFERENTIAL: HEMOGLOBIN: 13.9 g/dL (ref 13.5–17.5)

## 2013-10-13 LAB — HEMOGLOBIN A1C: HEMOGLOBIN A1C: 5.5 % (ref 4.0–6.0)

## 2013-10-13 LAB — BASIC METABOLIC PANEL
BUN: 16 mg/dL (ref 4–21)
CREATININE: 0.9 mg/dL (ref 0.6–1.3)
GLUCOSE: 113 mg/dL
Potassium: 4.3 mmol/L (ref 3.4–5.3)
Sodium: 137 mmol/L (ref 137–147)

## 2013-10-13 LAB — LIPID PANEL
CHOLESTEROL: 117 mg/dL (ref 0–200)
HDL: 60 mg/dL (ref 35–70)
LDL Cholesterol: 56 mg/dL
Triglycerides: 93 mg/dL (ref 40–160)

## 2013-11-07 ENCOUNTER — Other Ambulatory Visit: Payer: Self-pay

## 2013-11-07 ENCOUNTER — Encounter: Payer: Self-pay | Admitting: Internal Medicine

## 2013-11-07 DIAGNOSIS — M1990 Unspecified osteoarthritis, unspecified site: Secondary | ICD-10-CM

## 2013-11-07 MED ORDER — METOPROLOL SUCCINATE ER 25 MG PO TB24: 25.0000 mg | ORAL_TABLET | Freq: Every day | ORAL | Status: DC

## 2013-11-07 NOTE — Telephone Encounter (Signed)
Refill sent for metoprolol succ 

## 2013-11-07 NOTE — Telephone Encounter (Signed)
Pt needs 90 day written rx with 3 refills, please call when ready

## 2013-11-07 NOTE — Telephone Encounter (Signed)
See mychart message, last Ov 11/08/12

## 2013-11-08 ENCOUNTER — Other Ambulatory Visit: Payer: Self-pay | Admitting: *Deleted

## 2013-11-08 ENCOUNTER — Telehealth: Payer: Self-pay

## 2013-11-08 MED ORDER — CELECOXIB 200 MG PO CAPS: 200.0000 mg | ORAL_CAPSULE | Freq: Two times a day (BID) | ORAL | Status: DC

## 2013-11-08 MED ORDER — METOPROLOL SUCCINATE ER 25 MG PO TB24: 25.0000 mg | ORAL_TABLET | Freq: Every day | ORAL | Status: DC

## 2013-11-08 NOTE — Telephone Encounter (Signed)
Pt requesting written rx for Metoprolol succinate 25 mg. Dr. Gollan is on vacation at the time; awaiting signature from NP. 

## 2013-11-08 NOTE — Telephone Encounter (Signed)
Pt requesting written rx for Metoprolol succinate 25 mg. Dr. Mariah Milling is on vacation at the time; awaiting signature from NP.

## 2013-11-08 NOTE — Telephone Encounter (Signed)
Notified patient Rx for metoprolol is available to pick up.

## 2013-11-08 NOTE — Telephone Encounter (Signed)
Pt called yesterday requesting refill on Metoprolol, stated he needed a written rx to come and pick up, it was called in to his pharmacy instead. Please call pt when ready to pick up. Pt needs this by Friday

## 2013-11-08 NOTE — Telephone Encounter (Signed)
Please give cvelebrex rx printed, to front desk for pickup after I sign,.  thanks

## 2013-12-26 ENCOUNTER — Encounter: Payer: Self-pay | Admitting: Internal Medicine

## 2013-12-26 ENCOUNTER — Emergency Department: Payer: Self-pay | Admitting: Emergency Medicine

## 2013-12-26 NOTE — Telephone Encounter (Signed)
Please advise 

## 2014-02-24 ENCOUNTER — Ambulatory Visit (INDEPENDENT_AMBULATORY_CARE_PROVIDER_SITE_OTHER): Payer: 59

## 2014-02-24 DIAGNOSIS — Z23 Encounter for immunization: Secondary | ICD-10-CM

## 2014-03-06 ENCOUNTER — Encounter: Payer: Self-pay | Admitting: Cardiovascular Disease

## 2014-03-06 ENCOUNTER — Ambulatory Visit (INDEPENDENT_AMBULATORY_CARE_PROVIDER_SITE_OTHER): Payer: 59 | Admitting: Cardiovascular Disease

## 2014-03-06 VITALS — BP 120/78 | HR 68 | Ht 66.5 in | Wt 177.0 lb

## 2014-03-06 DIAGNOSIS — I10 Essential (primary) hypertension: Secondary | ICD-10-CM

## 2014-03-06 DIAGNOSIS — E785 Hyperlipidemia, unspecified: Secondary | ICD-10-CM

## 2014-03-06 DIAGNOSIS — Z87898 Personal history of other specified conditions: Secondary | ICD-10-CM | POA: Insufficient documentation

## 2014-03-06 DIAGNOSIS — Z9189 Other specified personal risk factors, not elsewhere classified: Secondary | ICD-10-CM

## 2014-03-06 DIAGNOSIS — I493 Ventricular premature depolarization: Secondary | ICD-10-CM | POA: Insufficient documentation

## 2014-03-06 NOTE — Patient Instructions (Signed)
You are doing well. No medication changes were made.  Please call us if you have new issues that need to be addressed before your next appt.  Your physician wants you to follow-up in: 12 months.  You will receive a reminder letter in the mail two months in advance. If you don't receive a letter, please call our office to schedule the follow-up appointment. 

## 2014-03-06 NOTE — Progress Notes (Signed)
Patient ID: Mark Pillownthony J Couse Jr., male    DOB: 09/06/1945, 68 y.o.   MRN: [id number]  HPI Comments: [age] y/o  male with h/o HTN, HL and previous syncope with negative Myoview in 2005. He also has a h/o palpitations and had a Life Watch monitor for several weeks but no events. Monitor only caught a single PVC.  He does have troubles with arthritis, anemia from iron deficiency  On his last clinic visit, he lost 30 pounds  in followup today, he continues to exercise 5 days per week. He is a high exercise threshold  He continues to take lisinopril 10 mg. Crestor daily 10 mg  occasional leg cramping,better with tonic water  Last hemoglobin A1c 5.9.     No CP or undue dyspnea during exercise. Minimal palpitations. Active at baseline.   EKG shows normal sinus rhythm with rate of 68 beats per minute beats per minute, no significant ST or T wave changes    Outpatient Encounter Prescriptions as of 03/06/2014  Medication Sig  . celecoxib (CELEBREX) 200 MG capsule Take 1 capsule (200 mg total) by mouth 2 (two) times daily. As needed for joint pain  . diclofenac sodium (VOLTAREN) 1 % GEL Apply 2 g topically 4 (four) times daily.  . ferrous sulfate 325 (65 FE) MG tablet Take 1 tablet (325 mg total) by mouth daily with breakfast.  . fish oil-omega-3 fatty acids 1000 MG capsule Take 1 capsule by mouth daily.    Marland Kitchen. HYDROcodone-acetaminophen (NORCO/VICODIN) 5-325 MG per tablet Take 1 tablet by mouth every 6 (six) hours as needed for pain.  Marland Kitchen. lisinopril (PRINIVIL,ZESTRIL) 10 MG tablet Take 1 tablet (10 mg total) by mouth daily.  . metoprolol succinate (TOPROL XL) 25 MG 24 hr tablet Take 1 tablet (25 mg total) by mouth daily.  . Multiple Vitamin (MULTIVITAMIN) tablet Take 1 tablet by mouth daily.    Marland Kitchen. omeprazole (PRILOSEC) 20 MG capsule Take 1 capsule (20 mg total) by mouth daily.  . rosuvastatin (CRESTOR) 10 MG tablet Take one-half (1/2) tablet by mouth daily  for a few weeks and then increase to 1  tablet daily if tolerated.  . traMADol (ULTRAM) 50 MG tablet Take 1 tablet (50 mg total) by mouth every 8 (eight) hours as needed for pain.    Review of Systems  Constitutional: Negative.   HENT: Negative.   Eyes: Negative.   Respiratory: Negative.   Cardiovascular: Negative.   Gastrointestinal: Negative.   Endocrine: Negative.   Musculoskeletal: Positive for myalgias.  Skin: Negative.   Allergic/Immunologic: Negative.   Neurological: Negative.   Hematological: Negative.   Psychiatric/Behavioral: Negative.   All other systems reviewed and are negative.  BP 120/78  Pulse 68  Ht 5' 6.5" (1.689 m)  Wt 177 lb (80.287 kg)  BMI 28.14 kg/m2  Physical Exam  Nursing note and vitals reviewed. Constitutional: He is oriented to person, place, and time. He appears well-developed and well-nourished.  HENT:  Head: Normocephalic.  Nose: Nose normal.  Mouth/Throat: Oropharynx is clear and moist.  Eyes: Conjunctivae are normal. Pupils are equal, round, and reactive to light.  Neck: Normal range of motion. Neck supple. No JVD present.  Cardiovascular: Normal rate, regular rhythm, S1 normal, S2 normal, normal heart sounds and intact distal pulses.  Exam reveals no gallop and no friction rub.   No murmur heard. Pulmonary/Chest: Effort normal and breath sounds normal. No respiratory distress. He has no wheezes. He has no rales. He exhibits no tenderness.  Abdominal: Soft. Bowel sounds are normal. He exhibits no distension. There is no tenderness.  Musculoskeletal: Normal range of motion. He exhibits no edema and no tenderness.  Lymphadenopathy:    He has no cervical adenopathy.  Neurological: He is alert and oriented to person, place, and time. Coordination normal.  Skin: Skin is warm and dry. No rash noted. No erythema.  Psychiatric: He has a normal mood and affect. His behavior is normal. Judgment and thought content normal.      Assessment and Plan

## 2014-03-06 NOTE — Assessment & Plan Note (Signed)
No further syncope over the past several years. No further workup needed at this time

## 2014-03-06 NOTE — Assessment & Plan Note (Signed)
Cholesterol is at goal on the current lipid regimen. No changes to the medications were made.  

## 2014-03-06 NOTE — Assessment & Plan Note (Signed)
Blood pressure is well controlled on today's visit. No changes made to the medications. 

## 2014-03-06 NOTE — Assessment & Plan Note (Signed)
Prior history of palpitations. These were documented on event monitor. Currently not having significant symptoms

## 2014-04-09 LAB — LIPID PANEL
Cholesterol: 148 mg/dL (ref 0–200)
HDL: 63 mg/dL (ref 35–70)
LDL Cholesterol: 72 mg/dL
Triglycerides: 141 mg/dL (ref 40–160)

## 2014-04-09 LAB — BASIC METABOLIC PANEL
BUN: 16 mg/dL (ref 4–21)
CREATININE: 0.9 mg/dL (ref 0.6–1.3)
GLUCOSE: 104 mg/dL
POTASSIUM: 5.2 mmol/L (ref 3.4–5.3)
Sodium: 137 mmol/L (ref 137–147)

## 2014-04-09 LAB — HEPATIC FUNCTION PANEL
ALT: 26 U/L (ref 10–40)
AST: 20 U/L (ref 14–40)
Alkaline Phosphatase: 64 U/L (ref 25–125)

## 2014-04-09 LAB — HEMOGLOBIN A1C: Hgb A1c MFr Bld: 5.3 % (ref 4.0–6.0)

## 2014-04-10 ENCOUNTER — Encounter: Payer: Self-pay | Admitting: Internal Medicine

## 2014-04-10 ENCOUNTER — Ambulatory Visit (INDEPENDENT_AMBULATORY_CARE_PROVIDER_SITE_OTHER): Payer: 59 | Admitting: Internal Medicine

## 2014-04-10 VITALS — BP 106/66 | HR 65 | Temp 97.3°F | Resp 16 | Ht 66.5 in | Wt 180.8 lb

## 2014-04-10 DIAGNOSIS — D509 Iron deficiency anemia, unspecified: Secondary | ICD-10-CM

## 2014-04-10 DIAGNOSIS — Z0000 Encounter for general adult medical examination without abnormal findings: Secondary | ICD-10-CM

## 2014-04-10 MED ORDER — PENCICLOVIR 1 % EX CREA: 1.0000 "application " | TOPICAL_CREAM | CUTANEOUS | Status: DC

## 2014-04-10 MED ORDER — TRAMADOL HCL 50 MG PO TABS: 50.0000 mg | ORAL_TABLET | Freq: Three times a day (TID) | ORAL | Status: DC | PRN

## 2014-04-10 NOTE — Progress Notes (Signed)
Pre-visit discussion using our clinic review tool. No additional management support is needed unless otherwise documented below in the visit note.  

## 2014-04-10 NOTE — Progress Notes (Addendum)
Patient ID: Mark Pillownthony J Capasso Jr., male   DOB: 05/01/1946, 68 y.o.   MRN: [id number]  The patient is here for annual Medicare wellness examination and management of other chronic and acute problems.[facility],   Has referrals to ortho and rheumatology for arthritis and right knee.  He does not want surgery.  Labs were done no sign of RA  Bilateral hand pain   Was tested for thalassemia ,  Negative   Sees [facility] on  Nov 23,     The risk factors are reflected in the social history.  The roster of all physicians providing medical care to patient - is listed in the Snapshot section of the chart.  Activities of daily living:  The patient is 100% independent in all ADLs: dressing, toileting, feeding as well as independent mobility  Home safety : The patient has smoke detectors in the home. They wear seatbelts.  There are no firearms at home. There is no violence in the home.   There is no risks for hepatitis, STDs or HIV. There is no   history of blood transfusion. They have no travel history to infectious disease endemic areas of the world.  The patient has seen their dentist in the last six month. They have seen their eye doctor in the last year. They admit to slight hearing difficulty with regard to whispered voices and some television programs.  They have deferred audiologic testing in the last year.  They do not  have excessive sun exposure. Discussed the need for sun protection: hats, long sleeves and use of sunscreen if there is significant sun exposure.   Diet: the importance of a healthy diet is discussed. They do have a healthy diet.  The benefits of regular aerobic exercise were discussed. She walks 4 times per week ,  20 minutes.   Depression screen: there are no signs or vegative symptoms of depression- irritability, change in appetite, anhedonia, sadness/tearfullness.  Cognitive assessment: the patient manages all their financial and personal affairs and is actively  engaged. They could relate day,date,year and events; recalled 2/3 objects at 3 minutes; performed clock-face test normally.  The following portions of the patient's history were reviewed and updated as appropriate: allergies, current medications, past family history, past medical history,  past surgical history, past social history  and problem list.  Visual acuity was not assessed per patient preference since she has regular follow up with her ophthalmologist. Hearing and body mass index were assessed and reviewed.   During the course of the visit the patient was educated and counseled about appropriate screening and preventive services including : fall prevention , diabetes screening, nutrition counseling, colorectal cancer screening, and recommended immunizations.    Objective:  BP 106/66 mmHg  Pulse 65  Temp(Src) 97.3 F (36.3 C) (Oral)  Resp 16  Ht 5' 6.5" (1.689 m)  Wt 180 lb 12 oz (81.988 kg)  BMI 28.74 kg/m2  SpO2 94%  General Appearance:    Alert, cooperative, no distress, appears stated age  Head:    Normocephalic, without obvious abnormality, atraumatic  Eyes:    PERRL, conjunctiva/corneas clear, EOM's intact, fundi    benign, both eyes       Ears:    Normal TM's and external ear canals, both ears  Nose:   Nares normal, septum midline, mucosa normal, no drainage   or sinus tenderness  Throat:   Lips, mucosa, and tongue normal; teeth and gums normal  Neck:   Supple, symmetrical, trachea midline,  no adenopathy;       thyroid:  No enlargement/tenderness/nodules; no carotid   bruit or JVD  Back:     Symmetric, no curvature, ROM normal, no CVA tenderness  Lungs:     Clear to auscultation bilaterally, respirations unlabored  Chest wall:    No tenderness or deformity  Heart:    Regular rate and rhythm, S1 and S2 normal, no murmur, rub   or gallop  Abdomen:     Soft, non-tender, bowel sounds active all four quadrants,    no masses, no organomegaly  Genitalia:    Normal male  without lesion, discharge or tenderness  Rectal:    Normal tone, normal prostate, no masses or tenderness;   guaiac negative stool  Extremities:   Extremities normal, atraumatic, no cyanosis or edema  Pulses:   2+ and symmetric all extremities  Skin:   Skin color, texture, turgor normal, no rashes or lesions  Lymph nodes:   Cervical, supraclavicular, and axillary nodes normal  Neurologic:   CNII-XII intact. Normal strength, sensation and reflexes      throughout    Assessment and Plan:  Medicare annual wellness visit, subsequent Annual Medicare wellness  exam was done as well as a comprehensive physical exam and management of acute and chronic conditions .  During the course of the visit the patient was educated and counseled about appropriate screening and preventive services including : fall prevention , diabetes screening, nutrition counseling, colorectal cancer screening, and recommended immunizations.  Printed recommendations for health maintenance screenings was given.   Anemia, iron deficiency Resolved by [facility] .  [place] [facility] . [place] [facility]OB[facility]  Next colonoscopy is due 2016.    Updated Medication List Outpatient Encounter Prescriptions as of 04/10/2014  Medication Sig  . celecoxib (CELEBREX) 200 MG capsule Take 1 capsule (200 mg total) by mouth 2 (two) times daily. As needed for joint pain  . ferrous sulfate 325 (65 FE) MG tablet Take 1 tablet (325 mg total) by mouth daily with breakfast.  . fish oil-omega-3 fatty acids 1000 MG capsule Take 1 capsule by mouth daily.    Marland Kitchen. HYDROcodone-acetaminophen (NORCO/VICODIN) 5-325 MG per tablet Take 1 tablet by mouth every 6 (six) hours as needed for pain.  Marland Kitchen. lisinopril (PRINIVIL,ZESTRIL) 10 MG tablet Take 1 tablet (10 mg total) by mouth daily.  . metoprolol succinate (TOPROL XL) 25 MG 24 hr tablet Take 1 tablet (25 mg total) by mouth daily.  . Multiple Vitamin (MULTIVITAMIN) tablet Take 1 tablet by mouth daily.     Marland Kitchen. omeprazole (PRILOSEC) 20 MG capsule Take 1 capsule (20 mg total) by mouth daily.  . rosuvastatin (CRESTOR) 10 MG tablet Take 10 mg by mouth daily.  . traMADol (ULTRAM) 50 MG tablet Take 1 tablet (50 mg total) by mouth every 8 (eight) hours as needed.  . [DISCONTINUED] traMADol (ULTRAM) 50 MG tablet Take 1 tablet (50 mg total) by mouth every 8 (eight) hours as needed for pain.  Marland Kitchen. diclofenac sodium (VOLTAREN) 1 % GEL Apply 2 g topically 4 (four) times daily.  Marland Kitchen. penciclovir (DENAVIR) 1 % cream Apply 1 application topically every 2 (two) hours. As needed for blister

## 2014-04-10 NOTE — Patient Instructions (Signed)
You had your annual  wellness exam today.    Please let me know what [facility]  [facility]  Health Maintenance A healthy lifestyle and preventative care can promote health and wellness.  Maintain regular health, dental, and eye exams.  Eat a healthy diet. Foods like vegetables, fruits, whole grains, low-fat dairy products, and lean protein foods contain the nutrients you need and are low in calories. Decrease your intake of foods high in solid fats, added sugars, and salt. Get information about a proper diet from your health care provider, if necessary.  Regular physical exercise is one of the most important things you can do for your health. Most adults should get at least 150 minutes of moderate-intensity exercise (any activity that increases your heart rate and causes you to sweat) each week. In addition, most adults need muscle-strengthening exercises on 2 or more days a week.   Maintain a healthy weight. The body mass index (BMI) is a screening tool to identify possible weight problems. It provides an estimate of body fat based on height and weight. Your health care provider can find your BMI and can help you achieve or maintain a healthy weight. For males 20 years and older:  A BMI below 18.5 is considered underweight.  A BMI of 18.5 to 24.9 is normal.  A BMI of 25 to 29.9 is considered overweight.  A BMI of 30 and above is considered obese.  Maintain normal blood lipids and cholesterol by exercising and minimizing your intake of saturated fat. Eat a balanced diet with plenty of fruits and vegetables. Blood tests for lipids and cholesterol should begin at age 68 and be repeated every 5 years. If your lipid or cholesterol levels are high, you are over age 68, or you are at high risk for heart disease, you may need your cholesterol levels checked more frequently.Ongoing high lipid and cholesterol levels should be treated with medicines if diet and exercise are  not working.  If you smoke, find out from your health care provider how to quit. If you do not use tobacco, do not start.  Lung cancer screening is recommended for adults aged 55-80 years who are at high risk for developing lung cancer because of a history of smoking. A yearly low-dose CT scan of the lungs is recommended for people who have at least a 30-pack-year history of smoking and are current smokers or have quit within the past 15 years. A pack year of smoking is smoking an average of 1 pack of cigarettes a day for 1 year (for example, a 30-pack-year history of smoking could mean smoking 1 pack a day for 30 years or 2 packs a day for 15 years). Yearly screening should continue until the smoker has stopped smoking for at least 15 years. Yearly screening should be stopped for people who develop a health problem that would prevent them from having lung cancer treatment.  If you choose to drink alcohol, do not have more than 2 drinks per day. One drink is considered to be 12 oz (360 mL) of beer, 5 oz (150 mL) of wine, or 1.5 oz (45 mL) of liquor.  Avoid the use of street drugs. Do not share needles with anyone. Ask for help if you need support or instructions about stopping the use of drugs.  High blood pressure causes heart disease and increases the risk of stroke. Blood pressure should be checked at least every 1-2 years. Ongoing  high blood pressure should be treated with medicines if weight loss and exercise are not effective.  If you are 70-92 years old, ask your health care provider if you should take aspirin to prevent heart disease.  Diabetes screening involves taking a blood sample to check your fasting blood sugar level. This should be done once every 3 years after age 75 if you are at a normal weight and without risk factors for diabetes. Testing should be considered at a younger age or be carried out more frequently if you are overweight and have at least 1 risk factor for  diabetes.  Colorectal cancer can be detected and often prevented. Most routine colorectal cancer screening begins at the age of 11 and continues through age 71. However, your health care provider may recommend screening at an earlier age if you have risk factors for colon cancer. On a yearly basis, your health care provider may provide home test kits to check for hidden blood in the stool. A small camera at the end of a tube may be used to directly examine the colon (sigmoidoscopy or colonoscopy) to detect the earliest forms of colorectal cancer. Talk to your health care provider about this at age 53 when routine screening begins. A direct exam of the colon should be repeated every 5-10 years through age 10, unless early forms of precancerous polyps or small growths are found.  People who are at an increased risk for hepatitis B should be screened for this virus. You are considered at high risk for hepatitis B if:  You were born in a country where hepatitis B occurs often. Talk with your health care provider about which countries are considered high risk.  Your parents were born in a high-risk country and you have not received a shot to protect against hepatitis B (hepatitis B vaccine).  You have HIV or AIDS.  You use needles to inject street drugs.  You live with, or have sex with, someone who has hepatitis B.  You are a man who has sex with other men (MSM).  You get hemodialysis treatment.  You take certain medicines for conditions like cancer, organ transplantation, and autoimmune conditions.  Hepatitis C blood testing is recommended for all people born from [birth year] through 1965 and any individual with known risk factors for hepatitis C.  Healthy men should no longer receive prostate-specific antigen (PSA) blood tests as part of routine cancer screening. Talk to your health care provider about prostate cancer screening.  Testicular cancer screening is not recommended for adolescents or  adult males who have no symptoms. Screening includes self-exam, a health care provider exam, and other screening tests. Consult with your health care provider about any symptoms you have or any concerns you have about testicular cancer.  Practice safe sex. Use condoms and avoid high-risk sexual practices to reduce the spread of sexually transmitted infections (STIs).  You should be screened for STIs, including gonorrhea and chlamydia if:  You are sexually active and are younger than 24 years.  You are older than 24 years, and your health care provider tells you that you are at risk for this type of infection.  Your sexual activity has changed since you were last screened, and you are at an increased risk for chlamydia or gonorrhea. Ask your health care provider if you are at risk.  If you are at risk of being infected with HIV, it is recommended that you take a prescription medicine daily to prevent HIV infection. This is  called pre-exposure prophylaxis (PrEP). You are considered at risk if:  You are a man who has sex with other men (MSM).  You are a heterosexual man who is sexually active with multiple partners.  You take drugs by injection.  You are sexually active with a partner who has HIV.  Talk with your health care provider about whether you are at high risk of being infected with HIV. If you choose to begin PrEP, you should first be tested for HIV. You should then be tested every 3 months for as long as you are taking PrEP.  Use sunscreen. Apply sunscreen liberally and repeatedly throughout the day. You should seek shade when your shadow is shorter than you. Protect yourself by wearing long sleeves, pants, a wide-brimmed hat, and sunglasses year round whenever you are outdoors.  Tell your health care provider of new moles or changes in moles, especially if there is a change in shape or color. Also, tell your health care provider if a mole is larger than the size of a pencil  eraser.  A one-time screening for abdominal aortic aneurysm (AAA) and surgical repair of large AAAs by ultrasound is recommended for men aged 7-75 years who are current or former smokers.  Stay current with your vaccines (immunizations). Document Released: 11/14/2007 Document Revised: 05/23/2013 Document Reviewed: 10/13/2010 [facility] Patient Information 2015 [place], [place]. This information is not intended to replace advice given to you by your health care provider. Make sure you discuss any questions you have with your health care provider.

## 2014-04-12 NOTE — Assessment & Plan Note (Addendum)
[facility] .  [facility] . Negative FOBT  Next colonoscopy is due 2016.

## 2014-04-12 NOTE — Assessment & Plan Note (Signed)

## 2014-04-24 ENCOUNTER — Telehealth: Payer: Self-pay | Admitting: Internal Medicine

## 2014-04-24 NOTE — Telephone Encounter (Signed)
Placed in red folder  

## 2014-04-24 NOTE — Telephone Encounter (Signed)
Pt dropped of letter for Dr. Darrick Huntsmanullo. Letter in Dr. Melina Schoolsullo's box.msn

## 2014-04-29 ENCOUNTER — Telehealth: Payer: Self-pay | Admitting: Internal Medicine

## 2014-04-29 DIAGNOSIS — E875 Hyperkalemia: Secondary | ICD-10-CM

## 2014-05-08 ENCOUNTER — Other Ambulatory Visit: Payer: 59

## 2014-05-09 ENCOUNTER — Encounter: Payer: Self-pay | Admitting: Internal Medicine

## 2014-05-09 ENCOUNTER — Other Ambulatory Visit (INDEPENDENT_AMBULATORY_CARE_PROVIDER_SITE_OTHER): Payer: 59

## 2014-05-09 DIAGNOSIS — E875 Hyperkalemia: Secondary | ICD-10-CM

## 2014-05-09 LAB — BASIC METABOLIC PANEL
BUN: 14 mg/dL (ref 6–23)
CO2: 28 mEq/L (ref 19–32)
Calcium: 9.1 mg/dL (ref 8.4–10.5)
Chloride: 100 mEq/L (ref 96–112)
Creatinine, Ser: 0.8 mg/dL (ref 0.4–1.5)
GFR: 99.1 mL/min (ref >60.00)
Glucose, Bld: 82 mg/dL (ref 70–99)
POTASSIUM: 4.1 meq/L (ref 3.5–5.1)
Sodium: 135 mEq/L (ref 135–145)

## 2014-05-10 ENCOUNTER — Telehealth: Payer: Self-pay | Admitting: Internal Medicine

## 2014-05-14 ENCOUNTER — Telehealth: Payer: Self-pay | Admitting: Cardiovascular Disease

## 2014-05-14 NOTE — Telephone Encounter (Signed)
Pt calling needing a hand written RX for amlodipine 90 days. Needs done by Thursday. Please call patient when ready

## 2014-05-15 NOTE — Telephone Encounter (Signed)
Pt requesting written Rx for amlodipine 5 mg 1 tablet by mouth daily. I do not see medication on medlist and he mentioned that he has been on it for some time now may have misplaced it when he has came in for a visit but that he has been taking it. Please advise.

## 2014-05-15 NOTE — Telephone Encounter (Signed)
Disregard appointment.

## 2014-05-15 NOTE — Telephone Encounter (Signed)
LMTCB pt requesting written Rx for Amlodipine is not on medication list and I do not see that Dr. Mariah MillingGollan prescribed medication.

## 2014-05-15 NOTE — Telephone Encounter (Signed)
Patient has not been seen over a year looks like he needs to schedule a future appointment as well.

## 2014-05-15 NOTE — Telephone Encounter (Signed)
LMOM to call regarding amlodipine.

## 2014-05-16 NOTE — Telephone Encounter (Signed)
Notified patient that the last time we had him on amlodipine was from 2013 but the patient states he has never brought in a list of medications to his follow up appointments nor his medications, so he can see why the amlodipine was missed. Please advise if you want him to continue on the amlodipine; he has been taking the amlodipine the whole time.

## 2014-05-17 NOTE — Telephone Encounter (Signed)
Please review patient needs amlodipine refilled if you want patient to continue.

## 2014-05-18 ENCOUNTER — Other Ambulatory Visit: Payer: Self-pay

## 2014-05-18 NOTE — Telephone Encounter (Signed)
Spoke w/ pt's wife.  Advised her that we are leaving written rx for amlodipine 5 mg #90 1 tab po daily at the front desk for pt to pick up at his convenience. She verbalizes understanding and is appreciative.

## 2014-08-03 ENCOUNTER — Ambulatory Visit (INDEPENDENT_AMBULATORY_CARE_PROVIDER_SITE_OTHER): Payer: 59 | Admitting: Nurse Practitioner

## 2014-08-03 ENCOUNTER — Ambulatory Visit: Payer: 59 | Admitting: Internal Medicine

## 2014-08-03 ENCOUNTER — Encounter: Payer: Self-pay | Admitting: Nurse Practitioner

## 2014-08-03 VITALS — BP 130/80 | HR 63 | Temp 97.8°F | Resp 14 | Ht 66.5 in | Wt 183.4 lb

## 2014-08-03 DIAGNOSIS — M62838 Other muscle spasm: Secondary | ICD-10-CM | POA: Diagnosis not present

## 2014-08-03 MED ORDER — CYCLOBENZAPRINE HCL 10 MG PO TABS
10.0000 mg | ORAL_TABLET | Freq: Three times a day (TID) | ORAL | Status: DC | PRN
Start: 2014-08-03 — End: 2016-08-18

## 2014-08-03 NOTE — Progress Notes (Signed)
Pre visit review using our clinic review tool, if applicable. No additional management support is needed unless otherwise documented below in the visit note. 

## 2014-08-03 NOTE — Patient Instructions (Addendum)
Do not take the flexeril with your other pain medications. Try it tonight to see if it will make you drowsy. This will help with sleep. You can take up to 3 times a day (not working/driving/operating machinery) as long as you are in a safe environment.   Call us if this is not helpful.

## 2014-08-03 NOTE — Progress Notes (Signed)
Subjective:    Patient ID: Mark PillowAnthony J Seier Farley., male    DOB: 04/06/1946, 69 y.o.   MRN: [id number]  HPI  Mark Farley is a 69 yo male with left scapular pain x 1 month.   Labtop started, driving, gym- stair stepper worse  1 month, worsening  Tramadol and hydrocodone- not helpful  Scapula left- looking up worse Relief- hand down  Worst- 4-5/10  Aching, feels at night  Heating pad not helpful   Review of Systems  Constitutional: Negative for fever, chills, diaphoresis, activity change and fatigue.  Eyes: Negative for visual disturbance.  Gastrointestinal: Negative for nausea, vomiting, diarrhea and rectal pain.  Genitourinary: Negative for dysuria and difficulty urinating.  Musculoskeletal: Positive for arthralgias. Negative for myalgias, joint swelling, gait problem, neck pain and neck stiffness.  Skin: Negative for rash.  Neurological: Negative for dizziness, weakness and numbness.   Past Medical History  Diagnosis Date  . History of syncope 2005    Unexplained, myoview negative  . Hypertension   . Hyperlipidemia   . Palpitations 2010    Life Watch with single PVC  . Arthritis     ? Gout  . Iron deficiency anemia   . Glucose intolerance (impaired glucose tolerance)   . Bilateral carpal tunnel syndrome     managed with nocturnal braces x 3 years  . Osteoarthritis of multiple joints   . COPD (chronic obstructive pulmonary disease)     History   Social History  . Marital Status: Married    Spouse Name: N/A  . Number of Children: N/A  . Years of Education: N/A   Occupational History  . Not on file.   Social History Main Topics  . Smoking status: Never Smoker   . Smokeless tobacco: Never Used  . Alcohol Use: 7.0 oz/week    14 drink(s) per week     Comment: 2 glasses of wine a day  . Drug Use: No  . Sexual Activity: Not on file   Other Topics Concern  . Not on file   Social History Narrative   Married   Very active    No past surgical history on  file.  Family History  Problem Relation Age of Onset  . Coronary artery disease Mother   . Heart attack Mother     MI  . Heart failure Father     CHF    No Known Allergies  Current Outpatient Prescriptions on File Prior to Visit  Medication Sig Dispense Refill  . amLODipine (NORVASC) 5 MG tablet Take 1 tablet (5 mg total) by mouth daily. 180 tablet 3  . celecoxib (CELEBREX) 200 MG capsule Take 1 capsule (200 mg total) by mouth 2 (two) times daily. As needed for joint pain 180 capsule 3  . ferrous sulfate 325 (65 FE) MG tablet Take 1 tablet (325 mg total) by mouth daily with breakfast. 90 tablet 3  . fish oil-omega-3 fatty acids 1000 MG capsule Take 1 capsule by mouth daily.      Marland Kitchen. HYDROcodone-acetaminophen (NORCO/VICODIN) 5-325 MG per tablet Take 1 tablet by mouth every 6 (six) hours as needed for pain. 60 tablet 0  . lisinopril (PRINIVIL,ZESTRIL) 10 MG tablet Take 1 tablet (10 mg total) by mouth daily. 30 tablet 3  . metoprolol succinate (TOPROL XL) 25 MG 24 hr tablet Take 1 tablet (25 mg total) by mouth daily. 90 tablet 3  . Multiple Vitamin (MULTIVITAMIN) tablet Take 1 tablet by mouth daily.      .Marland Kitchen  omeprazole (PRILOSEC) 20 MG capsule Take 1 capsule (20 mg total) by mouth daily. 90 capsule 3  . penciclovir (DENAVIR) 1 % cream Apply 1 application topically every 2 (two) hours. As needed for blister 1.5 g 3  . rosuvastatin (CRESTOR) 10 MG tablet Take 10 mg by mouth daily.    . traMADol (ULTRAM) 50 MG tablet Take 1 tablet (50 mg total) by mouth every 8 (eight) hours as needed. 90 tablet 4   No current facility-administered medications on file prior to visit.       Objective:   Physical Exam  Constitutional: He is oriented to person, place, and time. He appears well-developed and well-nourished. No distress.  BP 130/80 mmHg  Pulse 63  Temp(Src) 97.8 F (36.6 C) (Oral)  Resp 14  Ht 5' 6.5" (1.689 m)  Wt 183 lb 7 oz (83.208 kg)  BMI 29.17 kg/m2  SpO2 95%   HENT:  Head:  Normocephalic and atraumatic.  Right Ear: External ear normal.  Left Ear: External ear normal.  Eyes: Right eye exhibits no discharge. Left eye exhibits no discharge. No scleral icterus.  Cardiovascular: Normal rate, regular rhythm, normal heart sounds and intact distal pulses.  Exam reveals no gallop and no friction rub.   No murmur heard. Pulmonary/Chest: Effort normal and breath sounds normal. No respiratory distress. He has no wheezes. He has no rales. He exhibits no tenderness.  Musculoskeletal: Normal range of motion. He exhibits no edema or tenderness.  Neurological: He is alert and oriented to person, place, and time.  Skin: Skin is warm and dry. No rash noted. He is not diaphoretic.  Psychiatric: He has a normal mood and affect. His behavior is normal. Judgment and thought content normal.      Assessment & Plan:

## 2014-08-06 DIAGNOSIS — M62838 Other muscle spasm: Secondary | ICD-10-CM | POA: Insufficient documentation

## 2014-08-06 NOTE — Assessment & Plan Note (Addendum)
No MSK or Neuro deficits on exam. Probable muscle strain or spasm. Will continue with Celebrex, heating pad, and add flexeril 10 mg at night. FU prn worsening/failure to improve.

## 2014-10-17 LAB — BASIC METABOLIC PANEL
BUN: 18 mg/dL (ref 4–21)
CREATININE: 0.9 mg/dL (ref <=1.3)
Glucose: 108 mg/dL
POTASSIUM: 4.8 mmol/L (ref 3.4–5.3)
SODIUM: 134 mmol/L — AB (ref 137–147)

## 2014-10-17 LAB — LIPID PANEL
Cholesterol: 142 mg/dL (ref 0–200)
HDL: 55 mg/dL (ref 35–70)
LDL Cholesterol: 75 mg/dL
TRIGLYCERIDES: 152 mg/dL (ref 40–160)

## 2014-10-17 LAB — CBC AND DIFFERENTIAL
HEMATOCRIT: 40 % — AB (ref 41–53)
Hemoglobin: 13.5 g/dL (ref 13.5–17.5)
Platelets: 193 10*3/uL (ref 150–399)
WBC: 4.7 10^3/mL

## 2014-10-17 LAB — HEPATIC FUNCTION PANEL
ALT: 29 U/L (ref 10–40)
AST: 20 U/L (ref 14–40)
Alkaline Phosphatase: 56 U/L (ref 25–125)
BILIRUBIN, TOTAL: 1 mg/dL

## 2014-10-17 LAB — HEMOGLOBIN A1C: HEMOGLOBIN A1C: 5.4

## 2014-10-17 LAB — TSH: TSH: 0.57 u[IU]/mL (ref 0.41–5.90)

## 2014-10-17 LAB — PSA: PSA: 0.59

## 2014-11-05 ENCOUNTER — Encounter: Payer: Self-pay | Admitting: Internal Medicine

## 2014-11-06 ENCOUNTER — Other Ambulatory Visit: Payer: Self-pay | Admitting: *Deleted

## 2014-11-06 MED ORDER — OMEPRAZOLE 20 MG PO CPDR: 20.0000 mg | DELAYED_RELEASE_CAPSULE | Freq: Every day | ORAL | Status: DC

## 2014-11-06 MED ORDER — CELECOXIB 200 MG PO CAPS: 200.0000 mg | ORAL_CAPSULE | Freq: Two times a day (BID) | ORAL | Status: DC

## 2014-11-08 ENCOUNTER — Telehealth: Payer: Self-pay

## 2014-11-08 ENCOUNTER — Other Ambulatory Visit: Payer: Self-pay

## 2014-11-08 MED ORDER — METOPROLOL SUCCINATE ER 25 MG PO TB24: 25.0000 mg | ORAL_TABLET | Freq: Every day | ORAL | Status: DC

## 2014-11-08 NOTE — Telephone Encounter (Signed)
Pt needs Metoprolol rx 90 day hard copy left at the front. Please call and let know when ready

## 2014-11-09 NOTE — Telephone Encounter (Signed)
Left message on machine for patient

## 2014-11-12 NOTE — Telephone Encounter (Signed)
Left message on home machine that prescription ready for pickup

## 2015-01-11 ENCOUNTER — Telehealth: Payer: Self-pay | Admitting: Cardiovascular Disease

## 2015-01-11 LAB — HM COLONOSCOPY

## 2015-01-11 NOTE — Telephone Encounter (Signed)
Will forward to MD for further recommendations/ review prior to follow up on 02/07/15.

## 2015-01-11 NOTE — Telephone Encounter (Signed)
Patient c/o Palpitations:  High priority if patient c/o lightheadedness and shortness of breath.  1. How long have you been having palpitations? PVC's started a few years ago  In the last week or two they have started getting more frequent to almost constant   2. Are you currently experiencing lightheadedness and shortness of breath?  No   3. Have you checked your BP and heart rate? (document readings)      Pulse is low  (62-72 est)  bp has been high (145/90 (est)   4. Are you experiencing any other symptoms? No    Wants to be seen by [facility].  Scheduled first available on 02/07/15 at 9 am

## 2015-01-14 NOTE — Telephone Encounter (Signed)
Forward to Arida 

## 2015-01-14 NOTE — Telephone Encounter (Signed)
Get a 48 hr Holter.

## 2015-01-15 ENCOUNTER — Other Ambulatory Visit: Payer: Self-pay

## 2015-01-15 DIAGNOSIS — R002 Palpitations: Secondary | ICD-10-CM

## 2015-01-15 NOTE — Telephone Encounter (Signed)
S/w pt wife who states he is at the gym but can try reaching him on his cell phone.  Left message on cell phone VM for patient to contact the office regarding holter monitor.

## 2015-01-15 NOTE — Telephone Encounter (Signed)
S/w pt regarding Dr. Jari Sportsman recommendations. Pt agreeable to HM. Scheduled appt 8/17 9:45am for HM placement

## 2015-01-16 ENCOUNTER — Ambulatory Visit (INDEPENDENT_AMBULATORY_CARE_PROVIDER_SITE_OTHER): Payer: Medicare PPO

## 2015-01-16 DIAGNOSIS — R002 Palpitations: Secondary | ICD-10-CM | POA: Diagnosis not present

## 2015-01-29 ENCOUNTER — Other Ambulatory Visit: Payer: Self-pay

## 2015-01-29 MED ORDER — METOPROLOL SUCCINATE ER 50 MG PO TB24: 50.0000 mg | ORAL_TABLET | Freq: Every day | ORAL | Status: DC

## 2015-02-07 ENCOUNTER — Ambulatory Visit (INDEPENDENT_AMBULATORY_CARE_PROVIDER_SITE_OTHER): Payer: Medicare PPO | Admitting: Cardiovascular Disease

## 2015-02-07 ENCOUNTER — Encounter: Payer: Self-pay | Admitting: Cardiovascular Disease

## 2015-02-07 VITALS — BP 118/78 | HR 57 | Ht 66.5 in | Wt 184.0 lb

## 2015-02-07 DIAGNOSIS — E785 Hyperlipidemia, unspecified: Secondary | ICD-10-CM

## 2015-02-07 DIAGNOSIS — I471 Supraventricular tachycardia, unspecified: Secondary | ICD-10-CM | POA: Insufficient documentation

## 2015-02-07 DIAGNOSIS — I10 Essential (primary) hypertension: Secondary | ICD-10-CM

## 2015-02-07 DIAGNOSIS — I493 Ventricular premature depolarization: Secondary | ICD-10-CM | POA: Diagnosis not present

## 2015-02-07 DIAGNOSIS — R252 Cramp and spasm: Secondary | ICD-10-CM

## 2015-02-07 MED ORDER — AMLODIPINE BESYLATE 5 MG PO TABS: 5.0000 mg | ORAL_TABLET | Freq: Every day | ORAL | Status: DC

## 2015-02-07 MED ORDER — LISINOPRIL 10 MG PO TABS: 10.0000 mg | ORAL_TABLET | Freq: Every day | ORAL | Status: DC

## 2015-02-07 MED ORDER — DILTIAZEM HCL 30 MG PO TABS: 30.0000 mg | ORAL_TABLET | Freq: Four times a day (QID) | ORAL | Status: DC | PRN

## 2015-02-07 MED ORDER — ROSUVASTATIN CALCIUM 10 MG PO TABS
10.0000 mg | ORAL_TABLET | Freq: Every day | ORAL | Status: DC
Start: 2015-02-07 — End: 2016-03-02

## 2015-02-07 NOTE — Patient Instructions (Addendum)
You are doing well. You are having episodes of SVT, supraventricular tachycardia  Please stay on metopolol 50 daily Take diltiazem 30 mg as needed for tachycardia episodes  Please call if symptoms do not improve We could change amlodipine  to diltiazem ER 120 mg pill once a day  Please call us if you have new issues that need to be addressed before your next appt.  Your physician wants you to follow-up in: 6 months.  You will receive a reminder letter in the mail two months in advance. If you don't receive a letter, please call our office to schedule the follow-up appointment.

## 2015-02-07 NOTE — Assessment & Plan Note (Signed)
He is happy to stay on his Crestor. Takes quinine and tonic water for symptoms

## 2015-02-07 NOTE — Progress Notes (Signed)
Patient ID: Mark Kelley., male    DOB: [birth year], 69 y.o.   MRN: [id number]  HPI Comments: 81 beats y/o  male with h/o HTN, HL and previous syncope with negative Myoview in 2005. He also has a h/o palpitations and had a Life Watch monitor for several weeks but no events. Monitor only caught a single PVC.  He does have troubles with arthritis, anemia from iron deficiency  he continues to exercise 5 days per week. He is a high exercise threshold  He has been bothered by tachycardia Holter monitor was ordered showing frequent episodes of SVT, short-lived Metoprolol was increased from 25 up to 50 mg, possibly with mild improvement of his symptoms He continues to have significant leg cramping, takes quinine Wants to stay on crestor   No CP or undue dyspnea during exercise. Marland Kitchen Active at baseline.  Lab work reviewed with him, total cholesterol less than 150  Holter monitor shows normal sinus rhythm with frequent runs of SVT, rate up to 150 bpm. Rhythm strips shown to the patient  EKG from today showing normal sinus rhythm with rate 57 bpm, no significant ST or T-wave changes     No Known Allergies  Current Outpatient Prescriptions on File Prior to Visit  Medication Sig Dispense Refill  . amLODipine (NORVASC) 5 MG tablet Take 1 tablet (5 mg total) by mouth daily. 180 tablet 3  . celecoxib (CELEBREX) 200 MG capsule Take 1 capsule (200 mg total) by mouth 2 (two) times daily. As needed for joint pain 180 capsule 3  . cyclobenzaprine (FLEXERIL) 10 MG tablet Take 1 tablet (10 mg total) by mouth 3 (three) times daily as needed for muscle spasms. 90 tablet 1  . ferrous sulfate 325 (65 FE) MG tablet Take 1 tablet (325 mg total) by mouth daily with breakfast. 90 tablet 3  . fish oil-omega-3 fatty acids 1000 MG capsule Take 1 capsule by mouth daily.      Marland Kitchen HYDROcodone-acetaminophen (NORCO/VICODIN) 5-325 MG per tablet Take 1 tablet by mouth every 6 (six) hours as needed for pain. 60 tablet 0  .  lisinopril (PRINIVIL,ZESTRIL) 10 MG tablet Take 1 tablet (10 mg total) by mouth daily. 30 tablet 3  . metoprolol succinate (TOPROL-XL) 50 MG 24 hr tablet Take 1 tablet (50 mg total) by mouth daily. 90 tablet 3  . Multiple Vitamin (MULTIVITAMIN) tablet Take 1 tablet by mouth daily.      Marland Kitchen omeprazole (PRILOSEC) 20 MG capsule Take 1 capsule (20 mg total) by mouth daily. 90 capsule 3  . penciclovir (DENAVIR) 1 % cream Apply 1 application topically every 2 (two) hours. As needed for blister 1.5 g 3  . rosuvastatin (CRESTOR) 10 MG tablet Take 10 mg by mouth daily.    . traMADol (ULTRAM) 50 MG tablet Take 1 tablet (50 mg total) by mouth every 8 (eight) hours as needed. 90 tablet 4   No current facility-administered medications on file prior to visit.    Past Medical History  Diagnosis Date  . History of syncope 2005    Unexplained, myoview negative  . Hypertension   . Hyperlipidemia   . Palpitations 2010    Life Watch with single PVC  . Arthritis     ? Gout  . Iron deficiency anemia   . Glucose intolerance (impaired glucose tolerance)   . Bilateral carpal tunnel syndrome     managed with nocturnal braces x 3 years  . Osteoarthritis of multiple joints   .  COPD (chronic obstructive pulmonary disease)     Past Surgical History  Procedure Laterality Date  . Colonoscopy      Social History  reports that he has never smoked. He has never used smokeless tobacco. He reports that he drinks about 7.0 oz of alcohol per week. He reports that he does not use illicit drugs.  Family History family history includes Coronary artery disease in his mother; Heart attack in his mother; Heart failure in his father.   Review of Systems  Constitutional: Negative.   Respiratory: Negative.   Cardiovascular: Positive for palpitations.  Gastrointestinal: Negative.   Endocrine: Negative.   Musculoskeletal: Positive for myalgias.  Neurological: Negative.   Hematological: Negative.    Psychiatric/Behavioral: Negative.   All other systems reviewed and are negative.  BP 118/78 mmHg  Pulse 57  Ht 5' 6.5" (1.689 m)  Wt 184 lb (83.462 kg)  BMI 29.26 kg/m2  Physical Exam  Constitutional: He is oriented to person, place, and time. He appears well-developed and well-nourished.  HENT:  Head: Normocephalic.  Nose: Nose normal.  Mouth/Throat: Oropharynx is clear and moist.  Eyes: Conjunctivae are normal. Pupils are equal, round, and reactive to light.  Neck: Normal range of motion. Neck supple. No JVD present.  Cardiovascular: Normal rate, regular rhythm, S1 normal, S2 normal, normal heart sounds and intact distal pulses.  Exam reveals no gallop and no friction rub.   No murmur heard. Pulmonary/Chest: Effort normal and breath sounds normal. No respiratory distress. He has no wheezes. He has no rales. He exhibits no tenderness.  Abdominal: Soft. Bowel sounds are normal. He exhibits no distension. There is no tenderness.  Musculoskeletal: Normal range of motion. He exhibits no edema or tenderness.  Lymphadenopathy:    He has no cervical adenopathy.  Neurological: He is alert and oriented to person, place, and time. Coordination normal.  Skin: Skin is warm and dry. No rash noted. No erythema.  Psychiatric: He has a normal mood and affect. His behavior is normal. Judgment and thought content normal.      Assessment and Plan   Nursing note and vitals reviewed.

## 2015-02-07 NOTE — Assessment & Plan Note (Signed)
Blood pressure running mildly high. Recommended he continue to monitor his blood pressure If this runs high, would hold amlodipine, changed to diltiazem

## 2015-02-07 NOTE — Assessment & Plan Note (Signed)
Cholesterol is at goal on the current lipid regimen. No changes to the medications were made.  

## 2015-02-07 NOTE — Assessment & Plan Note (Signed)
Rare PVCs noted on Holter monitor Continue beta blocker

## 2015-02-07 NOTE — Assessment & Plan Note (Signed)
Paroxysmal SVT noted on Holter monitor  we will start diltiazem 30 mg tabs as needed, continue metoprolol 50 mg daily If he continues to have symptoms, suggested he call the office. We could stop the amlodipine, start diltiazem ER 120 g daily Could even start flecainide twice a day

## 2015-03-24 ENCOUNTER — Encounter: Payer: Self-pay | Admitting: Cardiovascular Disease

## 2015-03-25 ENCOUNTER — Other Ambulatory Visit: Payer: Self-pay

## 2015-03-25 MED ORDER — METOPROLOL SUCCINATE ER 50 MG PO TB24: 50.0000 mg | ORAL_TABLET | Freq: Every day | ORAL | Status: DC

## 2015-04-08 ENCOUNTER — Encounter: Payer: Self-pay | Admitting: Internal Medicine

## 2015-04-09 MED ORDER — TRAMADOL HCL 50 MG PO TABS: 50.0000 mg | ORAL_TABLET | Freq: Three times a day (TID) | ORAL | Status: DC | PRN

## 2015-04-12 ENCOUNTER — Telehealth: Payer: Self-pay | Admitting: Internal Medicine

## 2015-04-24 DIAGNOSIS — L821 Other seborrheic keratosis: Secondary | ICD-10-CM | POA: Diagnosis not present

## 2015-05-07 ENCOUNTER — Telehealth: Payer: Self-pay | Admitting: Internal Medicine

## 2015-05-07 NOTE — Telephone Encounter (Signed)
Pt wife called stating she received a vm from HyderKathy to call regarding pt. Thank You!

## 2015-05-07 NOTE — Telephone Encounter (Signed)
Patient scheduled Thursday for flu shot.

## 2015-05-09 ENCOUNTER — Ambulatory Visit (INDEPENDENT_AMBULATORY_CARE_PROVIDER_SITE_OTHER): Payer: Medicare PPO

## 2015-05-09 DIAGNOSIS — Z23 Encounter for immunization: Secondary | ICD-10-CM

## 2015-05-23 ENCOUNTER — Encounter: Payer: Self-pay | Admitting: Internal Medicine

## 2015-05-23 ENCOUNTER — Ambulatory Visit (INDEPENDENT_AMBULATORY_CARE_PROVIDER_SITE_OTHER): Payer: Medicare PPO | Admitting: Internal Medicine

## 2015-05-23 VITALS — BP 102/78 | HR 65 | Temp 98.0°F | Resp 12 | Ht 67.0 in | Wt 188.4 lb

## 2015-05-23 DIAGNOSIS — E663 Overweight: Secondary | ICD-10-CM

## 2015-05-23 DIAGNOSIS — I10 Essential (primary) hypertension: Secondary | ICD-10-CM

## 2015-05-23 DIAGNOSIS — R252 Cramp and spasm: Secondary | ICD-10-CM | POA: Diagnosis not present

## 2015-05-23 DIAGNOSIS — Z0000 Encounter for general adult medical examination without abnormal findings: Secondary | ICD-10-CM

## 2015-05-23 DIAGNOSIS — R7302 Impaired glucose tolerance (oral): Secondary | ICD-10-CM

## 2015-05-23 DIAGNOSIS — E785 Hyperlipidemia, unspecified: Secondary | ICD-10-CM

## 2015-05-23 NOTE — Progress Notes (Signed)
   Subjective:  Patient ID: Mark PillowAnthony J Canoy Farley., male    DOB: 02/04/1946  Age: 69 y.o. MRN: [id number]

## 2015-05-23 NOTE — Patient Instructions (Addendum)
For your joint pian ,  Try  adding tylenol up to 2000 mg daily,  In divided doses. (500 mg 4 times daily or 1000 mg twice daily ,  Take with your celebrex.  Ok to add one or tow tramadol daisy as well  2) Try stretching your hamstrings with tensi10 on band after showering to prevent nocturnal cramps   3) The  diet I discussed with you today is the 10 day Green Smoothie Cleansing /Detox Diet by Brooke DareJJ Smith . available on [employer] for around $10.  4( the hip pain is probably arthritis from your prior injury  5) You can try 5 mg melatonin, or Sleepy time Tea,  Or Tylenol PM (benadryl is the active ingredient )  This is not a low carb or a weight loss diet,  It is fundamentally a "cleansing" low fat diet that eliminates sugar, gluten, caffeine, alcohol and dairy for 10 days .  What you add back after the initial ten days is entirely up to  you!  You can expect to lose 5 to 10 lbs depending on how strict you are.   I found that  drinking 2 smoothies or juices  daily and keeping one chewable meal (but keep it simple, like baked fish and salad, rice or bok choy) kept me satisfied and kept me from straying  .  You snack primarily on fresh  fruit, egg whites and judicious quantities of nuts. I  Recommend adding a  vegetable based protein powder  To any smoothie made with almond milk.  (nothing with whey , since whey is dairy) in it.  [employer] has a Research officer, trade uniongreat selection .   It does require some form of a nutrient extractor (Vita Mix, a electric juicer,  Or a Nutribullet Rx).  i have found that using frozen fruits is much more convenient and cost effective. You can even find plenty of organic fruit in the frozen fruit section of [employer].  Just thaw what you need for the following day the night before in the refrigerator (to avoid jamming up your machine)    The organic greens drink I use if I don't have any fresh greens  is called "Suja" and it's sold in the vegetable refrigerated section of most grocery stores  (including [employer])  . It is tart, though, so be careful (has lemon juice in it )  The organic vegan protein powder I tried  is called Vega" and I found it at [employer] .  It is sugar free

## 2015-05-23 NOTE — Progress Notes (Signed)
Pre-visit discussion using our clinic review tool. No additional management support is needed unless otherwise documented below in the visit note.  

## 2015-05-26 DIAGNOSIS — Z0000 Encounter for general adult medical examination without abnormal findings: Secondary | ICD-10-CM | POA: Insufficient documentation

## 2015-05-26 NOTE — Assessment & Plan Note (Signed)
Managed with low GI diet.  A1c 5.4 recently

## 2015-05-26 NOTE — Assessment & Plan Note (Signed)
LDL and triglycerides are at goal on current medications. He has no side effects and liver enzymes are normal. No changes today  

## 2015-05-26 NOTE — Assessment & Plan Note (Addendum)
Recurrent,  Secondary to muscle fatigue.  Electrolytes reviewed.  No risk factors for PAD.  Recommend Epsom salt soaks and stretching.

## 2015-05-26 NOTE — Assessment & Plan Note (Signed)
Annual comprehensive preventive exam was done as well as an evaluation and management of chronic conditions .  During the course of the visit the patient was educated and counseled about appropriate screening and preventive services including :  diabetes screening, lipid analysis with projected  10 year  risk for CAD , nutrition counseling, colorectal cancer screening, and recommended immunizations.  Printed recommendations for health maintenance screenings was given.   

## 2015-05-26 NOTE — Progress Notes (Signed)
Patient ID: Anastacio Bua., male    DOB: [birth year]  Age: 69 y.o. MRN: [id number]  The patient is here for annual Medicare wellness examination and management of other chronic and acute problems.  Colonoscopy  was done and  2 polyps found,  August 2016 . 5 yr follow up was advised Screening PSA was done in  May PSA 0.59   Unchanged  Saw [facility] for annual skin exam,  all clear    The risk factors are reflected in the social history.  The roster of all physicians providing medical care to patient - is listed in the Snapshot section of the chart.  Activities of daily living:  The patient is 100% independent in all ADLs: dressing, toileting, feeding as well as independent mobility  Home safety : The patient has smoke detectors in the home. They wear seatbelts.  There are no firearms at home. There is no violence in the home.   There is no risks for hepatitis, STDs or HIV. There is no   history of blood transfusion. They have no travel history to infectious disease endemic areas of the world.  The patient has seen their dentist in the last six month. They have seen their eye doctor in the last year. They admit to slight hearing difficulty with regard to whispered voices and some television programs.  They have deferred audiologic testing in the last year.  They do not  have excessive sun exposure. Discussed the need for sun protection: hats, long sleeves and use of sunscreen if there is significant sun exposure.   Diet: the importance of a healthy diet is discussed. They do have a healthy diet.  The benefits of regular aerobic exercise were discussed. She walks 4 times per week ,  20 minutes.   Depression screen: there are no signs or vegative symptoms of depression- irritability, change in appetite, anhedonia, sadness/tearfullness.  Cognitive assessment: the patient manages all their financial and personal affairs and is actively engaged. They could relate day,date,year and events;  recalled 2/3 objects at 3 minutes; performed clock-face test normally.  The following portions of the patient's history were reviewed and updated as appropriate: allergies, current medications, past family history, past medical history,  past surgical history, past social history  and problem list.  Visual acuity was not assessed per patient preference since she has regular follow up with her ophthalmologist. Hearing and body mass index were assessed and reviewed.   During the course of the visit the patient was educated and counseled about appropriate screening and preventive services including : fall prevention , diabetes screening, nutrition counseling, colorectal cancer screening, and recommended immunizations.    CC: The primary encounter diagnosis was Medicare annual wellness visit, subsequent. Diagnoses of Encounter for preventive health examination, Overweight (BMI 25.0-29.9), Cramps of lower extremity, unspecified laterality, Glucose intolerance (impaired glucose tolerance), HYPERTENSION, BENIGN, and Hyperlipidemia were also pertinent to this visit.   Weight gain of 4 lbs wants to lose 15 lbs  Most recent A1c was 5.4 in May  Still getting a lot of muscle cramps , using diet tonic water. Occurring in the middle of gthe night. Recurrent occasionally during  exercise  Had [facility] checked [facility] with ABIs and they were normal  Takes crestor , Drinks 25 ounces of water  Caffeine free diet sdas  Had an attack of SVT Nov 28th;  took 3 diltiazem that day.  Np episodes since then.   Recurrent left hip  Pain since falling out of  tree 3 years ago .  X rayed 3 years ago   Having new onset insomnia.  Trouble falling asleep sometimes until  3 am ,  Waking up tired at 6 am . Diagnosed with PTSD by 2 psychologists previously  Has deferred medications.    History Aashrith has a past medical history of History of syncope (2005); Hypertension; Hyperlipidemia; Palpitations (2010); Arthritis; Iron  deficiency anemia; Glucose intolerance (impaired glucose tolerance); Bilateral carpal tunnel syndrome; Osteoarthritis of multiple joints; and COPD (chronic obstructive pulmonary disease) (HCC).   He has past surgical history that includes Colonoscopy.   His family history includes Coronary artery disease in his mother; Heart attack in his mother; Heart failure in his father.He reports that he has never smoked. He has never used smokeless tobacco. He reports that he drinks about 7.0 oz of alcohol per week. He reports that he does not use illicit drugs.  Outpatient Prescriptions Prior to Visit  Medication Sig Dispense Refill  . amLODipine (NORVASC) 5 MG tablet Take 1 tablet (5 mg total) by mouth daily. 90 tablet 3  . celecoxib (CELEBREX) 200 MG capsule Take 1 capsule (200 mg total) by mouth 2 (two) times daily. As needed for joint pain 180 capsule 3  . cyclobenzaprine (FLEXERIL) 10 MG tablet Take 1 tablet (10 mg total) by mouth 3 (three) times daily as needed for muscle spasms. 90 tablet 1  . diltiazem (CARDIZEM) 30 MG tablet Take 1 tablet (30 mg total) by mouth 4 (four) times daily as needed. 90 tablet 3  . ferrous sulfate 325 (65 FE) MG tablet Take 1 tablet (325 mg total) by mouth daily with breakfast. 90 tablet 3  . fish oil-omega-3 fatty acids 1000 MG capsule Take 1 capsule by mouth daily.      Marland Kitchen HYDROcodone-acetaminophen (NORCO/VICODIN) 5-325 MG per tablet Take 1 tablet by mouth every 6 (six) hours as needed for pain. 60 tablet 0  . lisinopril (PRINIVIL,ZESTRIL) 10 MG tablet Take 1 tablet (10 mg total) by mouth daily. 90 tablet 3  . metoprolol succinate (TOPROL-XL) 50 MG 24 hr tablet Take 1 tablet (50 mg total) by mouth daily. 90 tablet 3  . Multiple Vitamin (MULTIVITAMIN) tablet Take 1 tablet by mouth daily.      Marland Kitchen omeprazole (PRILOSEC) 20 MG capsule Take 1 capsule (20 mg total) by mouth daily. 90 capsule 3  . penciclovir (DENAVIR) 1 % cream Apply 1 application topically every 2 (two) hours.  As needed for blister 1.5 g 3  . rosuvastatin (CRESTOR) 10 MG tablet Take 1 tablet (10 mg total) by mouth daily. 90 tablet 3  . traMADol (ULTRAM) 50 MG tablet Take 1 tablet (50 mg total) by mouth every 8 (eight) hours as needed. 90 tablet 4   No facility-administered medications prior to visit.    Review of Systems   Patient denies headache, fevers, malaise, unintentional weight loss, skin rash, eye pain, sinus congestion and sinus pain, sore throat, dysphagia,  hemoptysis , cough, dyspnea, wheezing, chest pain, palpitations, orthopnea, edema, abdominal pain, nausea, melena, diarrhea, constipation, flank pain, dysuria, hematuria, urinary  Frequency, nocturia, numbness, tingling, seizures,  Focal weakness, Loss of consciousness,  Tremor, insomnia, depression, anxiety, and suicidal ideation.     Objective:  BP 102/78 mmHg  Pulse 65  Temp(Src) 98 F (36.7 C) (Oral)  Resp 12  Ht  (1.702 m)  Wt 188 lb 6 oz (85.446 kg)  BMI 29.50 kg/m2  SpO2 92%  Physical Exam  General appearance: alert, cooperative  and appears stated age Ears: normal TM's and external ear canals both ears Throat: lips, mucosa, and tongue normal; teeth and gums normal Neck: no adenopathy, no carotid bruit, supple, symmetrical, trachea midline and thyroid not enlarged, symmetric, no tenderness/mass/nodules Back: symmetric, no curvature. ROM normal. No CVA tenderness. Lungs: clear to auscultation bilaterally Heart: regular rate and rhythm, S1, S2 normal, no murmur, click, rub or gallop Abdomen: soft, non-tender; bowel sounds normal; no masses,  no organomegaly Pulses: 2+ and symmetric Skin: Skin color, texture, turgor normal. No rashes or lesions Lymph nodes: Cervical, supraclavicular, and axillary nodes normal.   Assessment & Plan:   Problem List Items Addressed This Visit    Hyperlipidemia    LDL and triglycerides are at goal on current medications. He has no side effects and liver enzymes are normal. No  changes today        HYPERTENSION, BENIGN    Well controlled on current regimen. Renal function stable, no changes today.      Overweight (BMI 25.0-29.9)    I have addressed  BMI and recommended wt loss of 10% of body weight over the next 6 months using a low fat, fruit/vegetable based Mediterranean diet and regular exercise a minimum of 5 days per week.        Glucose intolerance (impaired glucose tolerance)    Managed with low GI diet.  A1c 5.4 recently       RESOLVED: Medicare annual wellness visit, subsequent - Primary    .      Leg cramping    Recurrent,  Secondary to muscle fatigue.  Electrolytes reviewed.  No risk factors for PAD.  Recommend Epsom salt soaks and stretching.       Encounter for preventive health examination    Annual comprehensive preventive exam was done as well as an evaluation and management of chronic conditions .  During the course of the visit the patient was educated and counseled about appropriate screening and preventive services including :  diabetes screening, lipid analysis with projected  10 year  risk for CAD,  nutrition counseling, colorectal cancer screening, and recommended immunizations.  Printed recommendations for health maintenance screenings was given.          I am having Mr. Talbot Grumblingbbruzzi maintain his fish oil-omega-3 fatty acids, multivitamin, HYDROcodone-acetaminophen, ferrous sulfate, penciclovir, cyclobenzaprine, celecoxib, omeprazole, diltiazem, rosuvastatin, lisinopril, amLODipine, metoprolol succinate, and traMADol.  No orders of the defined types were placed in this encounter.    There are no discontinued medications.  Follow-up: No Follow-up on file.   Sherlene ShamsULLO, Renton Berkley L, MD

## 2015-05-26 NOTE — Assessment & Plan Note (Signed)
I have addressed  BMI and recommended wt loss of 10% of body weight over the next 6 months using a low fat, fruit/vegetable based Mediterranean diet and regular exercise a minimum of 5 days per week.   

## 2015-05-26 NOTE — Assessment & Plan Note (Signed)
Well controlled on current regimen. Renal function stable, no changes today. 

## 2015-06-19 LAB — TSH: TSH: 0.66 u[IU]/mL (ref 0.41–5.90)

## 2015-06-19 LAB — HEPATIC FUNCTION PANEL
ALT: 34 U/L (ref 10–40)
AST: 22 U/L (ref 14–40)
Alkaline Phosphatase: 60 U/L (ref 25–125)

## 2015-06-19 LAB — BASIC METABOLIC PANEL
BUN: 17 mg/dL (ref 4–21)
CREATININE: 0.9 mg/dL (ref 0.6–1.3)
Glucose: 120 mg/dL
POTASSIUM: 4.3 mmol/L (ref 3.4–5.3)
Sodium: 137 mmol/L (ref 137–147)

## 2015-06-19 LAB — LIPID PANEL
CHOLESTEROL: 151 mg/dL (ref 0–200)
HDL: 61 mg/dL (ref 35–70)
LDL Cholesterol: 82 mg/dL
TRIGLYCERIDES: 154 mg/dL (ref 40–160)

## 2015-07-02 ENCOUNTER — Encounter: Payer: Self-pay | Admitting: Internal Medicine

## 2015-07-03 ENCOUNTER — Telehealth: Payer: Self-pay | Admitting: Internal Medicine

## 2015-07-03 NOTE — Telephone Encounter (Signed)
In results folder abstracted.

## 2015-07-03 NOTE — Telephone Encounter (Signed)
Pt wife came in and dropped off pt lab results placed in Dr. Ceasar Lund box.. Please advise pt with any questions.Marland Kitchen

## 2015-07-03 NOTE — Telephone Encounter (Signed)
Please advise 

## 2015-07-04 ENCOUNTER — Other Ambulatory Visit: Payer: Self-pay | Admitting: Internal Medicine

## 2015-07-04 DIAGNOSIS — R7301 Impaired fasting glucose: Secondary | ICD-10-CM

## 2015-08-07 ENCOUNTER — Encounter: Payer: Self-pay | Admitting: Cardiovascular Disease

## 2015-08-07 ENCOUNTER — Ambulatory Visit (INDEPENDENT_AMBULATORY_CARE_PROVIDER_SITE_OTHER): Payer: Medicare Other | Admitting: Cardiovascular Disease

## 2015-08-07 VITALS — BP 122/72 | HR 61 | Ht 67.0 in | Wt 186.5 lb

## 2015-08-07 DIAGNOSIS — I493 Ventricular premature depolarization: Secondary | ICD-10-CM | POA: Diagnosis not present

## 2015-08-07 DIAGNOSIS — E785 Hyperlipidemia, unspecified: Secondary | ICD-10-CM | POA: Diagnosis not present

## 2015-08-07 DIAGNOSIS — I10 Essential (primary) hypertension: Secondary | ICD-10-CM

## 2015-08-07 DIAGNOSIS — I471 Supraventricular tachycardia: Secondary | ICD-10-CM | POA: Diagnosis not present

## 2015-08-07 NOTE — Progress Notes (Signed)
Patient ID: Mark Pillownthony J Sandquist Jr., male    DOB: 10/14/1945, 70 y.o.   MRN: [id number]  HPI Comments: 89 beats y/o  male with h/o HTN, HL and previous syncope with negative Myoview in 2005. He also has a h/o palpitations and had a Life Watch monitor for several weeks but no events. Monitor only caught a single PVC.  He does have troubles with arthritis, anemia from iron deficiency He presents today for follow-up of his palpitations, hypertension Previous Holter monitor showing runs of SVT, rate up to 150 bpm  He denies any recent symptoms of palpitations, But did have a severe series of tachycardia episodes day after Thanksgiving 2016 Reported he was driving in a car back from a trip, tachycardia seem to happen every 5 minutes. He took several diltiazem, possibly up to 3 or 4 and eventually symptoms seem to improve. Since then denies having any further episodes, possibly very short, with no severe symptoms  He continues to exercise on most daily with no symptoms of chest pain or shortness of breath Lab work reviewed showing hemoglobin A1c 5.4, total cholesterol 151, LDL 82  EKG on today's visit shows normal sinus rhythm with rate 61 bpm, no significant ST or T-wave changes  Other past medical history Holter monitor showing frequent episodes of SVT, short-lived total cholesterol less than 150   No Known Allergies  Current Outpatient Prescriptions on File Prior to Visit  Medication Sig Dispense Refill  . amLODipine (NORVASC) 5 MG tablet Take 1 tablet (5 mg total) by mouth daily. 90 tablet 3  . celecoxib (CELEBREX) 200 MG capsule Take 1 capsule (200 mg total) by mouth 2 (two) times daily. As needed for joint pain 180 capsule 3  . cyclobenzaprine (FLEXERIL) 10 MG tablet Take 1 tablet (10 mg total) by mouth 3 (three) times daily as needed for muscle spasms. 90 tablet 1  . diltiazem (CARDIZEM) 30 MG tablet Take 1 tablet (30 mg total) by mouth 4 (four) times daily as needed. 90 tablet 3  .  ferrous sulfate 325 (65 FE) MG tablet Take 1 tablet (325 mg total) by mouth daily with breakfast. 90 tablet 3  . fish oil-omega-3 fatty acids 1000 MG capsule Take 1 capsule by mouth daily.      Marland Kitchen. HYDROcodone-acetaminophen (NORCO/VICODIN) 5-325 MG per tablet Take 1 tablet by mouth every 6 (six) hours as needed for pain. 60 tablet 0  . lisinopril (PRINIVIL,ZESTRIL) 10 MG tablet Take 1 tablet (10 mg total) by mouth daily. 90 tablet 3  . metoprolol succinate (TOPROL-XL) 50 MG 24 hr tablet Take 1 tablet (50 mg total) by mouth daily. 90 tablet 3  . Multiple Vitamin (MULTIVITAMIN) tablet Take 1 tablet by mouth daily.      Marland Kitchen. omeprazole (PRILOSEC) 20 MG capsule Take 1 capsule (20 mg total) by mouth daily. 90 capsule 3  . penciclovir (DENAVIR) 1 % cream Apply 1 application topically every 2 (two) hours. As needed for blister 1.5 g 3  . rosuvastatin (CRESTOR) 10 MG tablet Take 1 tablet (10 mg total) by mouth daily. 90 tablet 3  . traMADol (ULTRAM) 50 MG tablet Take 1 tablet (50 mg total) by mouth every 8 (eight) hours as needed. 90 tablet 4   No current facility-administered medications on file prior to visit.    Past Medical History  Diagnosis Date  . History of syncope 2005    Unexplained, myoview negative  . Hypertension   . Hyperlipidemia   . Palpitations 2010  Life Watch with single PVC  . Arthritis     ? Gout  . Iron deficiency anemia   . Glucose intolerance (impaired glucose tolerance)   . Bilateral carpal tunnel syndrome     managed with nocturnal braces x 3 years  . Osteoarthritis of multiple joints   . COPD (chronic obstructive pulmonary disease) [facility])     Past Surgical History  Procedure Laterality Date  . Colonoscopy      Social History  reports that he has never smoked. He has never used smokeless tobacco. He reports that he drinks about 7.0 oz of alcohol per week. He reports that he does not use illicit drugs.  Family History family history includes Coronary artery  disease in his mother; Heart attack in his mother; Heart failure in his father.   Review of Systems  Constitutional: Negative.   Respiratory: Negative.   Cardiovascular: Positive for palpitations.  Gastrointestinal: Negative.   Endocrine: Negative.   Neurological: Negative.   Hematological: Negative.   Psychiatric/Behavioral: Negative.   All other systems reviewed and are negative.  BP 122/72 mmHg  Pulse 61  Ht  (1.702 m)  Wt 186 lb 8 oz (84.596 kg)  BMI 29.20 kg/m2  Physical Exam  Constitutional: He is oriented to person, place, and time. He appears well-developed and well-nourished.  HENT:  Head: Normocephalic.  Nose: Nose normal.  Mouth/Throat: Oropharynx is clear and moist.  Eyes: Conjunctivae are normal. Pupils are equal, round, and reactive to light.  Neck: Normal range of motion. Neck supple. No JVD present.  Cardiovascular: Normal rate, regular rhythm, S1 normal, S2 normal, normal heart sounds and intact distal pulses.  Exam reveals no gallop and no friction rub.   No murmur heard. Pulmonary/Chest: Effort normal and breath sounds normal. No respiratory distress. He has no wheezes. He has no rales. He exhibits no tenderness.  Abdominal: Soft. Bowel sounds are normal. He exhibits no distension. There is no tenderness.  Musculoskeletal: Normal range of motion. He exhibits no edema or tenderness.  Lymphadenopathy:    He has no cervical adenopathy.  Neurological: He is alert and oriented to person, place, and time. Coordination normal.  Skin: Skin is warm and dry. No rash noted. No erythema.  Psychiatric: He has a normal mood and affect. His behavior is normal. Judgment and thought content normal.      Assessment and Plan   Nursing note and vitals reviewed.

## 2015-08-07 NOTE — Patient Instructions (Signed)
You are doing well. No medication changes were made.  Please call us if you have new issues that need to be addressed before your next appt.  Your physician wants you to follow-up in: 12 months.  You will receive a reminder letter in the mail two months in advance. If you don't receive a letter, please call our office to schedule the follow-up appointment. 

## 2015-08-07 NOTE — Assessment & Plan Note (Signed)
No significant symptoms since November 2016 He would like to continue taking diltiazem short acting doses as needed for symptoms Recommended if symptoms get much worse on a regular basis, lasting longer that he call our office Could consider ablation, also could consider antiarrhythmic medication, possibly per Rythmol or flecainide

## 2015-08-07 NOTE — Assessment & Plan Note (Signed)
Cholesterol is at goal on the current lipid regimen. No changes to the medications were made.  

## 2015-08-07 NOTE — Assessment & Plan Note (Signed)
Blood pressure is well controlled on today's visit. No changes made to the medications. 

## 2015-09-16 ENCOUNTER — Telehealth: Payer: Self-pay | Admitting: Internal Medicine

## 2015-09-16 NOTE — Telephone Encounter (Signed)
Spoke with the patient he is taking his crestor in the evenings and when he goes to bed his legs and stomach are cramping like crazy, it is very uncomfortable.  It is worse if he is stretching his legs out.  He doesn't notice the discomfort during the day as much.  No other symptoms.  Please advise?

## 2015-09-16 NOTE — Telephone Encounter (Signed)
Left a detailed message for the patient to suspend the medications.

## 2015-09-16 NOTE — Telephone Encounter (Signed)
Suspend the crestor for a month and see if the cramps subside

## 2015-09-16 NOTE — Telephone Encounter (Signed)
Pt called about medication rosuvastatin (CRESTOR) 10 MG tablet he states cramping in his stomach and legs and cannot sleep at night. Call pt @ [phone]. Thank you!

## 2015-10-11 ENCOUNTER — Encounter: Payer: Self-pay | Admitting: Internal Medicine

## 2015-10-11 DIAGNOSIS — E785 Hyperlipidemia, unspecified: Secondary | ICD-10-CM

## 2015-10-11 DIAGNOSIS — Z7289 Other problems related to lifestyle: Secondary | ICD-10-CM

## 2015-10-11 DIAGNOSIS — Z125 Encounter for screening for malignant neoplasm of prostate: Secondary | ICD-10-CM

## 2015-10-11 DIAGNOSIS — R7303 Prediabetes: Secondary | ICD-10-CM

## 2015-10-17 ENCOUNTER — Other Ambulatory Visit (INDEPENDENT_AMBULATORY_CARE_PROVIDER_SITE_OTHER): Payer: Medicare Other

## 2015-10-17 DIAGNOSIS — Z125 Encounter for screening for malignant neoplasm of prostate: Secondary | ICD-10-CM | POA: Diagnosis not present

## 2015-10-17 DIAGNOSIS — Z7289 Other problems related to lifestyle: Secondary | ICD-10-CM

## 2015-10-17 DIAGNOSIS — E785 Hyperlipidemia, unspecified: Secondary | ICD-10-CM | POA: Diagnosis not present

## 2015-10-17 DIAGNOSIS — R7303 Prediabetes: Secondary | ICD-10-CM

## 2015-10-17 DIAGNOSIS — R7301 Impaired fasting glucose: Secondary | ICD-10-CM

## 2015-10-17 LAB — MICROALBUMIN / CREATININE URINE RATIO
CREATININE, U: 123.8 mg/dL
MICROALB/CREAT RATIO: 1.5 mg/g (ref 0.0–30.0)
Microalb, Ur: 1.8 mg/dL (ref 0.0–1.9)

## 2015-10-17 LAB — COMPREHENSIVE METABOLIC PANEL
ALK PHOS: 46 U/L (ref 39–117)
ALT: 20 U/L (ref 0–53)
AST: 20 U/L (ref 0–37)
Albumin: 4.7 g/dL (ref 3.5–5.2)
BILIRUBIN TOTAL: 1.3 mg/dL — AB (ref 0.2–1.2)
BUN: 15 mg/dL (ref 6–23)
CO2: 28 mEq/L (ref 19–32)
CREATININE: 0.87 mg/dL (ref 0.40–1.50)
Calcium: 9.5 mg/dL (ref 8.4–10.5)
Chloride: 96 mEq/L (ref 96–112)
GFR: 92.17 mL/min (ref >60.00)
GLUCOSE: 96 mg/dL (ref 70–99)
Potassium: 4.5 mEq/L (ref 3.5–5.1)
SODIUM: 132 meq/L — AB (ref 135–145)
TOTAL PROTEIN: 6.8 g/dL (ref 6.0–8.3)

## 2015-10-17 LAB — LIPID PANEL
CHOL/HDL RATIO: 5
Cholesterol: 234 mg/dL — ABNORMAL HIGH (ref 0–200)
HDL: 50.4 mg/dL (ref >39.00)
LDL Cholesterol: 169 mg/dL — ABNORMAL HIGH (ref 0–99)
NONHDL: 183.66
TRIGLYCERIDES: 75 mg/dL (ref 0.0–149.0)
VLDL: 15 mg/dL (ref 0.0–40.0)

## 2015-10-17 LAB — MAGNESIUM: Magnesium: 2 mg/dL (ref 1.5–2.5)

## 2015-10-17 LAB — LDL CHOLESTEROL, DIRECT: Direct LDL: 159 mg/dL

## 2015-10-17 LAB — PSA, MEDICARE: PSA: 0.64 ng/ml (ref 0.10–4.00)

## 2015-10-17 LAB — HEMOGLOBIN A1C: HEMOGLOBIN A1C: 6.2 % (ref 4.6–6.5)

## 2015-10-18 ENCOUNTER — Encounter: Payer: Self-pay | Admitting: Internal Medicine

## 2015-10-18 LAB — HEPATITIS C ANTIBODY: HCV Ab: NEGATIVE

## 2015-10-24 ENCOUNTER — Encounter: Payer: TRICARE For Life (TFL) | Admitting: Internal Medicine

## 2015-11-06 ENCOUNTER — Encounter: Payer: Self-pay | Admitting: Internal Medicine

## 2015-11-07 ENCOUNTER — Telehealth: Payer: Self-pay | Admitting: Internal Medicine

## 2015-11-07 ENCOUNTER — Encounter: Payer: Self-pay | Admitting: Internal Medicine

## 2015-11-07 MED ORDER — CELECOXIB 200 MG PO CAPS: 200.0000 mg | ORAL_CAPSULE | Freq: Two times a day (BID) | ORAL | Status: DC

## 2015-11-07 NOTE — Telephone Encounter (Signed)
Pt's wife came into office to pick up his refill that he requested on his MYchart, please see pt's mychart.

## 2015-11-07 NOTE — Telephone Encounter (Signed)
Patient notified script placed at front desk. 

## 2015-11-22 ENCOUNTER — Encounter: Payer: Self-pay | Admitting: Internal Medicine

## 2015-11-22 ENCOUNTER — Ambulatory Visit (INDEPENDENT_AMBULATORY_CARE_PROVIDER_SITE_OTHER): Payer: Medicare Other | Admitting: Internal Medicine

## 2015-11-22 VITALS — BP 120/86 | HR 55 | Temp 97.7°F | Resp 12 | Ht 67.0 in | Wt 174.5 lb

## 2015-11-22 DIAGNOSIS — R7303 Prediabetes: Secondary | ICD-10-CM

## 2015-11-22 DIAGNOSIS — E785 Hyperlipidemia, unspecified: Secondary | ICD-10-CM

## 2015-11-22 DIAGNOSIS — I10 Essential (primary) hypertension: Secondary | ICD-10-CM | POA: Diagnosis not present

## 2015-11-22 DIAGNOSIS — R252 Cramp and spasm: Secondary | ICD-10-CM | POA: Diagnosis not present

## 2015-11-22 DIAGNOSIS — Z0000 Encounter for general adult medical examination without abnormal findings: Secondary | ICD-10-CM

## 2015-11-22 NOTE — Progress Notes (Signed)
Patient ID: Mark Pillownthony J Longfield Jr., male    DOB: 01/25/1946  Age: 70 y.o. MRN: [id number]  The patient is here for annual Medicare wellness examination and management of other chronic and acute problems.   The risk factors are reflected in the social history.  The roster of all physicians providing medical care to patient - is listed in the Snapshot section of the chart.  Activities of daily living:  The patient is 100% independent in all ADLs: dressing, toileting, feeding as well as independent mobility  Home safety : The patient has smoke detectors in the home. They wear seatbelts.  There are no firearms at home. There is no violence in the home.   There is no risks for hepatitis, STDs or HIV. There is no   history of blood transfusion. They have no travel history to infectious disease endemic areas of the world.  The patient has seen their dentist in the last six month. They have seen their eye doctor in the last year. They admit to slight hearing difficulty with regard to whispered voices and some television programs.  They have deferred audiologic testing in the last year.  They do not  have excessive sun exposure. Discussed the need for sun protection: hats, long sleeves and use of sunscreen if there is significant sun exposure.   Diet: the importance of a healthy diet is discussed. They do have a healthy diet.  The benefits of regular aerobic exercise were discussed. He exercises daily for an hour adn works in the yard as well gardening.  Depression screen: there are no signs or vegative symptoms of depression- irritability, change in appetite, anhedonia, sadness/tearfullness.  Cognitive assessment: the patient manages all their financial and personal affairs and is actively engaged. They could relate day,date,year and events; recalled 2/3 objects at 3 minutes; performed clock-face test normally.  The following portions of the patient's history were reviewed and updated as appropriate:  allergies, current medications, past family history, past medical history,  past surgical history, past social history  and problem list.  Visual acuity was not assessed per patient preference since she has regular follow up with her ophthalmologist. Hearing and body mass index were assessed and reviewed.   During the course of the visit the patient was educated and counseled about appropriate screening and preventive services including : fall prevention , diabetes screening, nutrition counseling, colorectal cancer screening, and recommended immunizations.    CC: The primary encounter diagnosis was Medicare annual wellness visit, subsequent. Diagnoses of Cramp of both lower extremities, Prediabetes, HYPERTENSION, BENIGN, and Hyperlipidemia were also pertinent to this visit.  Prediabetes indicated by recent A1c screen.  Has been checking his BS once daily at various times and all have been > 80 but less than 120.    History [name] has a past medical history of History of syncope (2005); Hypertension; Hyperlipidemia; Palpitations (2010); Arthritis; Iron deficiency anemia; Glucose intolerance (impaired glucose tolerance); Bilateral carpal tunnel syndrome; Osteoarthritis of multiple joints; and COPD (chronic obstructive pulmonary disease) ([facility]).   He has past surgical history that includes Colonoscopy.   His family history includes Coronary artery disease in his mother; Heart attack in his mother; Heart failure in his father.He reports that he has never smoked. He has never used smokeless tobacco. He reports that he drinks about 7.0 oz of alcohol per week. He reports that he does not use illicit drugs.  Outpatient Prescriptions Prior to Visit  Medication Sig Dispense Refill  . amLODipine (NORVASC) 5 MG tablet  Take 1 tablet (5 mg total) by mouth daily. 90 tablet 3  . celecoxib (CELEBREX) 200 MG capsule Take 1 capsule (200 mg total) by mouth 2 (two) times daily. As needed for joint pain 180 capsule 3   . cyclobenzaprine (FLEXERIL) 10 MG tablet Take 1 tablet (10 mg total) by mouth 3 (three) times daily as needed for muscle spasms. 90 tablet 1  . diltiazem (CARDIZEM) 30 MG tablet Take 1 tablet (30 mg total) by mouth 4 (four) times daily as needed. 90 tablet 3  . ferrous sulfate 325 (65 FE) MG tablet Take 1 tablet (325 mg total) by mouth daily with breakfast. 90 tablet 3  . fish oil-omega-3 fatty acids 1000 MG capsule Take 1 capsule by mouth daily.      Marland Kitchen HYDROcodone-acetaminophen (NORCO/VICODIN) 5-325 MG per tablet Take 1 tablet by mouth every 6 (six) hours as needed for pain. 60 tablet 0  . lisinopril (PRINIVIL,ZESTRIL) 10 MG tablet Take 1 tablet (10 mg total) by mouth daily. 90 tablet 3  . metoprolol succinate (TOPROL-XL) 50 MG 24 hr tablet Take 1 tablet (50 mg total) by mouth daily. 90 tablet 3  . Multiple Vitamin (MULTIVITAMIN) tablet Take 1 tablet by mouth daily.      Marland Kitchen omeprazole (PRILOSEC) 20 MG capsule Take 1 capsule (20 mg total) by mouth daily. 90 capsule 3  . penciclovir (DENAVIR) 1 % cream Apply 1 application topically every 2 (two) hours. As needed for blister 1.5 g 3  . traMADol (ULTRAM) 50 MG tablet Take 1 tablet (50 mg total) by mouth every 8 (eight) hours as needed. 90 tablet 4  . rosuvastatin (CRESTOR) 10 MG tablet Take 1 tablet (10 mg total) by mouth daily. (Patient not taking: Reported on 11/22/2015) 90 tablet 3   No facility-administered medications prior to visit.    Review of Systems   Patient denies headache, fevers, malaise, unintentional weight loss, skin rash, eye pain, sinus congestion and sinus pain, sore throat, dysphagia,  hemoptysis , cough, dyspnea, wheezing, chest pain, palpitations, orthopnea, edema, abdominal pain, nausea, melena, diarrhea, constipation, flank pain, dysuria, hematuria, urinary  Frequency, nocturia, numbness, tingling, seizures,  Focal weakness, Loss of consciousness,  Tremor, insomnia, depression, anxiety, and suicidal ideation.       Objective:  BP 120/86 mmHg  Pulse 55  Temp(Src) 97.7 F (36.5 C) (Oral)  Resp 12  Ht  (1.702 m)  Wt 174 lb 8 oz (79.153 kg)  BMI 27.32 kg/m2  SpO2 95%  Physical Exam   General appearance: alert, cooperative and appears stated age Ears: normal TM's and external ear canals both ears Throat: lips, mucosa, and tongue normal; teeth and gums normal Neck: no adenopathy, no carotid bruit, supple, symmetrical, trachea midline and thyroid not enlarged, symmetric, no tenderness/mass/nodules Back: symmetric, no curvature. ROM normal. No CVA tenderness. Lungs: clear to auscultation bilaterally Heart: regular rate and rhythm, S1, S2 normal, no murmur, click, rub or gallop Abdomen: soft, non-tender; bowel sounds normal; no masses,  no organomegaly Pulses: 2+ and symmetric Skin: Skin color, texture, turgor normal. No rashes or lesions Lymph nodes: Cervical, supraclavicular, and axillary nodes normal.    Assessment & Plan:   Problem List Items Addressed This Visit    Hyperlipidemia    Managed with Crestor 10 mg daily until recently when he discontinued medication due to leg cramps. LD has risen from 82 to 169 without therapy. Advised to resume statin at QOD  Dosing or 1/2 tablet daily as a trial  Lab Results  Component Value Date   CHOL 234* 10/17/2015   HDL 50.40 10/17/2015   LDLCALC 169* 10/17/2015   LDLDIRECT 159.0 10/17/2015   TRIG 75.0 10/17/2015   CHOLHDL 5 10/17/2015         HYPERTENSION, BENIGN    Well controlled on current regimen. Renal function stable, no changes today.  Lab Results  Component Value Date   CREATININE 0.87 10/17/2015   Lab Results  Component Value Date   NA 132* 10/17/2015   K 4.5 10/17/2015   CL 96 10/17/2015   CO2 28 10/17/2015         Prediabetes    Managed with low GI diet.  A1c  Has risen from 5.4 to 6.2  Recently, but his BS have been normal.    Lab Results  Component Value Date   HGBA1C 6.2 10/17/2015         Leg  cramping    Recurring nightly. Advise him to use MR nightly and drink G2 as an electrolyte replacement dringk given his hyponatremia on recent BMET.  Lab Results  Component Value Date   NA 132* 10/17/2015   K 4.5 10/17/2015   CL 96 10/17/2015   CO2 28 10/17/2015   Lab Results  Component Value Date   TSH 0.66 06/19/2015         Medicare annual wellness visit, subsequent - Primary    Annual Medicare wellness  exam was done as well as a comprehensive physical exam and management of acute and chronic conditions .  During the course of the visit the patient was educated and counseled about appropriate screening and preventive services including : fall prevention , diabetes screening, nutrition counseling, colorectal cancer screening, and recommended immunizations.  Printed recommendations for health maintenance screenings was given.          I am having Mr. Talbot Grumblingbbruzzi maintain his fish oil-omega-3 fatty acids, multivitamin, HYDROcodone-acetaminophen, ferrous sulfate, penciclovir, cyclobenzaprine, omeprazole, diltiazem, rosuvastatin, lisinopril, amLODipine, metoprolol succinate, traMADol, and celecoxib.  No orders of the defined types were placed in this encounter.    There are no discontinued medications.  Follow-up: No Follow-up on file.   Sherlene ShamsULLO, Akeel Reffner L, MD

## 2015-11-22 NOTE — Patient Instructions (Signed)
You can stop checking your blood sugars!    We'll repeat labs in 6 months  Thanks for the beautiful beans!!!! Health Maintenance, Male A healthy lifestyle and preventative care can promote health and wellness.  Maintain regular health, dental, and eye exams.  Eat a healthy diet. Foods like vegetables, fruits, whole grains, low-fat dairy products, and lean protein foods contain the nutrients you need and are low in calories. Decrease your intake of foods high in solid fats, added sugars, and salt. Get information about a proper diet from your health care provider, if necessary.  Regular physical exercise is one of the most important things you can do for your health. Most adults should get at least 150 minutes of moderate-intensity exercise (any activity that increases your heart rate and causes you to sweat) each week. In addition, most adults need muscle-strengthening exercises on 2 or more days a week.   Maintain a healthy weight. The body mass index (BMI) is a screening tool to identify possible weight problems. It provides an estimate of body fat based on height and weight. Your health care provider can find your BMI and can help you achieve or maintain a healthy weight. For males 20 years and older:  A BMI below 18.5 is considered underweight.  A BMI of 18.5 to 24.9 is normal.  A BMI of 25 to 29.9 is considered overweight.  A BMI of 30 and above is considered obese.  Maintain normal blood lipids and cholesterol by exercising and minimizing your intake of saturated fat. Eat a balanced diet with plenty of fruits and vegetables. Blood tests for lipids and cholesterol should begin at age 70 and be repeated every 5 years. If your lipid or cholesterol levels are high, you are over age 70, or you are at high risk for heart disease, you may need your cholesterol levels checked more frequently.Ongoing high lipid and cholesterol levels should be treated with medicines if diet and exercise are  not working.  If you smoke, find out from your health care provider how to quit. If you do not use tobacco, do not start.  Lung cancer screening is recommended for adults aged 55-80 years who are at high risk for developing lung cancer because of a history of smoking. A yearly low-dose CT scan of the lungs is recommended for people who have at least a 30-pack-year history of smoking and are current smokers or have quit within the past 15 years. A pack year of smoking is smoking an average of 1 pack of cigarettes a day for 1 year (for example, a 30-pack-year history of smoking could mean smoking 1 pack a day for 30 years or 2 packs a day for 15 years). Yearly screening should continue until the smoker has stopped smoking for at least 15 years. Yearly screening should be stopped for people who develop a health problem that would prevent them from having lung cancer treatment.  If you choose to drink alcohol, do not have more than 2 drinks per day. One drink is considered to be 12 oz (360 mL) of beer, 5 oz (150 mL) of wine, or 1.5 oz (45 mL) of liquor.  Avoid the use of street drugs. Do not share needles with anyone. Ask for help if you need support or instructions about stopping the use of drugs.  High blood pressure causes heart disease and increases the risk of stroke. High blood pressure is more likely to develop in:  People who have blood pressure in the  end of the normal range (100-139/85-89 mm Hg).  People who are overweight or obese.  People who are African American.  If you are 70-73 years of age, have your blood pressure checked every 3-5 years. If you are 37 years of age or older, have your blood pressure checked every year. You should have your blood pressure measured twice--once when you are at a hospital or clinic, and once when you are not at a hospital or clinic. Record the average of the two measurements. To check your blood pressure when you are not at a hospital or clinic, you can  use:  An automated blood pressure machine at a pharmacy.  A home blood pressure monitor.  If you are 9-5 years old, ask your health care provider if you should take aspirin to prevent heart disease.  Diabetes screening involves taking a blood sample to check your fasting blood sugar level. This should be done once every 3 years after age 82 if you are at a normal weight and without risk factors for diabetes. Testing should be considered at a younger age or be carried out more frequently if you are overweight and have at least 1 risk factor for diabetes.  Colorectal cancer can be detected and often prevented. Most routine colorectal cancer screening begins at the age of 9 and continues through age 58. However, your health care provider may recommend screening at an earlier age if you have risk factors for colon cancer. On a yearly basis, your health care provider may provide home test kits to check for hidden blood in the stool. A small camera at the end of a tube may be used to directly examine the colon (sigmoidoscopy or colonoscopy) to detect the earliest forms of colorectal cancer. Talk to your health care provider about this at age 27 when routine screening begins. A direct exam of the colon should be repeated every 5-10 years through age 41, unless early forms of precancerous polyps or small growths are found.  People who are at an increased risk for hepatitis B should be screened for this virus. You are considered at high risk for hepatitis B if:  You were born in a country where hepatitis B occurs often. Talk with your health care provider about which countries are considered high risk.  Your parents were born in a high-risk country and you have not received a shot to protect against hepatitis B (hepatitis B vaccine).  You have HIV or AIDS.  You use needles to inject street drugs.  You live with, or have sex with, someone who has hepatitis B.  You are a man who has sex with other  men (MSM).  You get hemodialysis treatment.  You take certain medicines for conditions like cancer, organ transplantation, and autoimmune conditions.  Hepatitis C blood testing is recommended for all people born from [birth year] through 1965 and any individual with known risk factors for hepatitis C.  Healthy men should no longer receive prostate-specific antigen (PSA) blood tests as part of routine cancer screening. Talk to your health care provider about prostate cancer screening.  Testicular cancer screening is not recommended for adolescents or adult males who have no symptoms. Screening includes self-exam, a health care provider exam, and other screening tests. Consult with your health care provider about any symptoms you have or any concerns you have about testicular cancer.  Practice safe sex. Use condoms and avoid high-risk sexual practices to reduce the spread of sexually transmitted infections (STIs).  You should  be screened for STIs, including gonorrhea and chlamydia if:  You are sexually active and are younger than 24 years.  You are older than 24 years, and your health care provider tells you that you are at risk for this type of infection.  Your sexual activity has changed since you were last screened, and you are at an increased risk for chlamydia or gonorrhea. Ask your health care provider if you are at risk.  If you are at risk of being infected with HIV, it is recommended that you take a prescription medicine daily to prevent HIV infection. This is called pre-exposure prophylaxis (PrEP). You are considered at risk if:  You are a man who has sex with other men (MSM).  You are a heterosexual man who is sexually active with multiple partners.  You take drugs by injection.  You are sexually active with a partner who has HIV.  Talk with your health care provider about whether you are at high risk of being infected with HIV. If you choose to begin PrEP, you should first be tested  for HIV. You should then be tested every 3 months for as long as you are taking PrEP.  Use sunscreen. Apply sunscreen liberally and repeatedly throughout the day. You should seek shade when your shadow is shorter than you. Protect yourself by wearing long sleeves, pants, a wide-brimmed hat, and sunglasses year round whenever you are outdoors.  Tell your health care provider of new moles or changes in moles, especially if there is a change in shape or color. Also, tell your health care provider if a mole is larger than the size of a pencil eraser.  A one-time screening for abdominal aortic aneurysm (AAA) and surgical repair of large AAAs by ultrasound is recommended for men aged 19-75 years who are current or former smokers.  Stay current with your vaccines (immunizations).   This information is not intended to replace advice given to you by your health care provider. Make sure you discuss any questions you have with your health care provider.   Document Released: 11/14/2007 Document Revised: 06/08/2014 Document Reviewed: 10/13/2010 Elsevier Interactive Patient Education [employer].

## 2015-11-22 NOTE — Progress Notes (Signed)
Pre-visit discussion using our clinic review tool. No additional management support is needed unless otherwise documented below in the visit note.  

## 2015-11-24 NOTE — Assessment & Plan Note (Addendum)
Managed with low GI diet.  A1c  Has risen from 5.4 to 6.2  Recently, but his BS have been normal.    Lab Results  Component Value Date   HGBA1C 6.2 10/17/2015

## 2015-11-24 NOTE — Assessment & Plan Note (Signed)
Recurring nightly. Advise him to use MR nightly and drink G2 as an electrolyte replacement dringk given his hyponatremia on recent BMET.  Lab Results  Component Value Date   NA 132* 10/17/2015   K 4.5 10/17/2015   CL 96 10/17/2015   CO2 28 10/17/2015   Lab Results  Component Value Date   TSH 0.66 06/19/2015

## 2015-11-24 NOTE — Assessment & Plan Note (Addendum)
Managed with Crestor 10 mg daily until recently when he discontinued medication due to leg cramps. LD has risen from 82 to 169 without therapy. Advised to resume statin at QOD  Dosing or 1/2 tablet daily as a trial    Lab Results  Component Value Date   CHOL 234* 10/17/2015   HDL 50.40 10/17/2015   LDLCALC 169* 10/17/2015   LDLDIRECT 159.0 10/17/2015   TRIG 75.0 10/17/2015   CHOLHDL 5 10/17/2015

## 2015-11-24 NOTE — Assessment & Plan Note (Signed)
Well controlled on current regimen. Renal function stable, no changes today.  Lab Results  Component Value Date   CREATININE 0.87 10/17/2015   Lab Results  Component Value Date   NA 132* 10/17/2015   K 4.5 10/17/2015   CL 96 10/17/2015   CO2 28 10/17/2015

## 2015-11-24 NOTE — Assessment & Plan Note (Signed)

## 2016-03-01 ENCOUNTER — Encounter: Payer: Self-pay | Admitting: Cardiovascular Disease

## 2016-03-02 ENCOUNTER — Other Ambulatory Visit: Payer: Self-pay | Admitting: *Deleted

## 2016-03-02 MED ORDER — ROSUVASTATIN CALCIUM 10 MG PO TABS: 10.0000 mg | ORAL_TABLET | Freq: Every day | ORAL | 3 refills | Status: DC

## 2016-03-02 MED ORDER — METOPROLOL SUCCINATE ER 50 MG PO TB24: 50.0000 mg | ORAL_TABLET | Freq: Every day | ORAL | 3 refills | Status: DC

## 2016-05-11 LAB — BASIC METABOLIC PANEL
BUN: 12 mg/dL (ref 4–21)
CREATININE: 0.8 mg/dL (ref <=1.3)
Glucose: 89 mg/dL
POTASSIUM: 4.3 mmol/L (ref 3.4–5.3)
Sodium: 138 mmol/L (ref 137–147)

## 2016-05-11 LAB — LIPID PANEL
CHOLESTEROL: 134 mg/dL (ref 0–200)
HDL: 67 mg/dL (ref 35–70)
LDL Cholesterol: 70 mg/dL
TRIGLYCERIDES: 111 mg/dL (ref 40–160)

## 2016-05-11 LAB — CBC AND DIFFERENTIAL
HCT: 43 % (ref 41–53)
HEMOGLOBIN: 14.8 g/dL (ref 13.5–17.5)
PLATELETS: 170 10*3/uL (ref 150–399)
WBC: 5.6 10^3/mL

## 2016-05-11 LAB — PSA: PSA: 0.6

## 2016-05-11 LAB — HEMOGLOBIN A1C: HEMOGLOBIN A1C: 5.7

## 2016-05-22 ENCOUNTER — Encounter: Payer: Medicare PPO | Admitting: Internal Medicine

## 2016-05-29 ENCOUNTER — Ambulatory Visit (INDEPENDENT_AMBULATORY_CARE_PROVIDER_SITE_OTHER): Payer: Medicare Other | Admitting: Internal Medicine

## 2016-05-29 ENCOUNTER — Encounter: Payer: Self-pay | Admitting: Internal Medicine

## 2016-05-29 ENCOUNTER — Other Ambulatory Visit: Payer: Self-pay | Admitting: Internal Medicine

## 2016-05-29 VITALS — BP 152/80 | HR 62 | Temp 98.0°F | Ht 67.0 in | Wt 185.6 lb

## 2016-05-29 DIAGNOSIS — R7303 Prediabetes: Secondary | ICD-10-CM | POA: Diagnosis not present

## 2016-05-29 DIAGNOSIS — I10 Essential (primary) hypertension: Secondary | ICD-10-CM | POA: Diagnosis not present

## 2016-05-29 DIAGNOSIS — I471 Supraventricular tachycardia: Secondary | ICD-10-CM

## 2016-05-29 DIAGNOSIS — M25539 Pain in unspecified wrist: Secondary | ICD-10-CM | POA: Diagnosis not present

## 2016-05-29 DIAGNOSIS — E7800 Pure hypercholesterolemia, unspecified: Secondary | ICD-10-CM

## 2016-05-29 DIAGNOSIS — E663 Overweight: Secondary | ICD-10-CM

## 2016-05-29 MED ORDER — CIPROFLOXACIN HCL 500 MG PO TABS: 500.0000 mg | ORAL_TABLET | Freq: Two times a day (BID) | ORAL | 0 refills | Status: DC

## 2016-05-29 MED ORDER — ONDANSETRON HCL 4 MG PO TABS: 4.0000 mg | ORAL_TABLET | Freq: Three times a day (TID) | ORAL | 0 refills | Status: DC | PRN

## 2016-05-29 MED ORDER — METHYLPREDNISOLONE ACETATE 40 MG/ML IJ SUSP
40.0000 mg | Freq: Once | INTRAMUSCULAR | Status: AC
  Administered 2016-05-29: 40 mg via INTRAMUSCULAR

## 2016-05-29 NOTE — Patient Instructions (Addendum)
New goal for BP is 120/70  Take the toprol at night,    If bps at home still not equal or less  Than 120/70,  Increase the lisinopril to 20 mg daily    Debrox drops in left ear  3 drops at night,  Plug  ear with cotton ball overnight and shower in the morning.   I sent a prescription for ciprofloxacin and odansetron (Zofran) to take with you on your cruise.  These can be used for traveler's diarrhea.

## 2016-05-29 NOTE — Progress Notes (Signed)
Pre visit review using our clinic review tool, if applicable. No additional management support is needed unless otherwise documented below in the visit note. 

## 2016-05-29 NOTE — Progress Notes (Signed)
Subjective:  Patient ID: Mark Pillow., male    DOB: 1946/02/19  Age: 70 y.o. MRN: [id number]  CC: The primary encounter diagnosis was Arthralgia of forearm, unspecified laterality. Diagnoses of Prediabetes, SVT (supraventricular tachycardia) (HCC), HYPERTENSION, BENIGN, Overweight (BMI 25.0-29.9), and Pure hypercholesterolemia were also pertinent to this visit.  HPI Mark Farley. presents for follow up on hypertension,  SVT with PVC's,  prediabetes and hyperlipidemia.    Has gained 11 lbs since last visit. Very frustrated,  Exercising  4 Days per week.  Going on a cruise in late January and was hoping to lose the weight by then.    Hypertension: patient checks blood pressure twice weekly at home.  Readings have been for the most part <130/80 at rest . Patient is following a reduced salt diet most days and is taking medications as prescribed in the morning.  His blood pressure  machine has been calibrated.    Bilateral thumb pain , not completely relieved with celebrex and tramadol.   Does not want orthopedic referral or sterid taper due to concern about weight gain . Discussed depo medrol injection   New onset insomnia, occurring for the past 6 months,  Trouble initiating. Frequent wake ups. However, the last several days have been improved and he has slept well.      Lab Results  Component Value Date   HGBA1C 6.2 10/17/2015        Outpatient Medications Prior to Visit  Medication Sig Dispense Refill  . amLODipine (NORVASC) 5 MG tablet Take 1 tablet (5 mg total) by mouth daily. 90 tablet 3  . celecoxib (CELEBREX) 200 MG capsule Take 1 capsule (200 mg total) by mouth 2 (two) times daily. As needed for joint pain 180 capsule 3  . cyclobenzaprine (FLEXERIL) 10 MG tablet Take 1 tablet (10 mg total) by mouth 3 (three) times daily as needed for muscle spasms. 90 tablet 1  . diltiazem (CARDIZEM) 30 MG tablet Take 1 tablet (30 mg total) by mouth 4 (four) times daily as  needed. 90 tablet 3  . ferrous sulfate 325 (65 FE) MG tablet Take 1 tablet (325 mg total) by mouth daily with breakfast. 90 tablet 3  . fish oil-omega-3 fatty acids 1000 MG capsule Take 1 capsule by mouth daily.      Marland Kitchen HYDROcodone-acetaminophen (NORCO/VICODIN) 5-325 MG per tablet Take 1 tablet by mouth every 6 (six) hours as needed for pain. 60 tablet 0  . lisinopril (PRINIVIL,ZESTRIL) 10 MG tablet Take 1 tablet (10 mg total) by mouth daily. 90 tablet 3  . metoprolol succinate (TOPROL-XL) 50 MG 24 hr tablet Take 1 tablet (50 mg total) by mouth daily. 90 tablet 3  . Multiple Vitamin (MULTIVITAMIN) tablet Take 1 tablet by mouth daily.      Marland Kitchen omeprazole (PRILOSEC) 20 MG capsule Take 1 capsule (20 mg total) by mouth daily. 90 capsule 3  . penciclovir (DENAVIR) 1 % cream Apply 1 application topically every 2 (two) hours. As needed for blister 1.5 g 3  . rosuvastatin (CRESTOR) 10 MG tablet Take 1 tablet (10 mg total) by mouth daily. 90 tablet 3  . traMADol (ULTRAM) 50 MG tablet Take 1 tablet (50 mg total) by mouth every 8 (eight) hours as needed. 90 tablet 4   No facility-administered medications prior to visit.     Review of Systems;  Patient denies headache, fevers, malaise, unintentional weight loss, skin rash, eye pain, sinus congestion and sinus pain, sore throat, dysphagia,  hemoptysis , cough, dyspnea, wheezing, chest pain, palpitations, orthopnea, edema, abdominal pain, nausea, melena, diarrhea, constipation, flank pain, dysuria, hematuria, urinary  Frequency, nocturia, numbness, tingling, seizures,  Focal weakness, Loss of consciousness,  Tremor, insomnia, depression, anxiety, and suicidal ideation.      Objective:  BP (!) 152/80   Pulse 62   Temp 98 F (36.7 C) (Oral)   Ht 5\' 7"  (1.702 m)   Wt 185 lb 9.6 oz (84.2 kg)   SpO2 96%   BMI 29.07 kg/m   BP Readings from Last 3 Encounters:  05/29/16 (!) 152/80  11/22/15 120/86  08/07/15 122/72    Wt Readings from Last 3 Encounters:    05/29/16 185 lb 9.6 oz (84.2 kg)  11/22/15 174 lb 8 oz (79.2 kg)  08/07/15 186 lb 8 oz (84.6 kg)    General appearance: alert, cooperative and appears stated age Ears: normal TM's and external ear canals both ears Throat: lips, mucosa, and tongue normal; teeth and gums normal Neck: no adenopathy, no carotid bruit, supple, symmetrical, trachea midline and thyroid not enlarged, symmetric, no tenderness/mass/nodules Back: symmetric, no curvature. ROM normal. No CVA tenderness. Lungs: clear to auscultation bilaterally Heart: regular rate and rhythm, S1, S2 normal, no murmur, click, rub or gallop Abdomen: soft, non-tender; bowel sounds normal; no masses,  no organomegaly Pulses: 2+ and symmetric Skin: Skin color, texture, turgor normal. No rashes or lesions Lymph nodes: Cervical, supraclavicular, and axillary nodes normal.  Lab Results  Component Value Date   HGBA1C 6.2 10/17/2015   HGBA1C 5.4 10/17/2014   HGBA1C 5.3 04/09/2014    Lab Results  Component Value Date   CREATININE 0.87 10/17/2015   CREATININE 0.9 06/19/2015   CREATININE 0.9 10/17/2014    Lab Results  Component Value Date   WBC 4.7 10/17/2014   HGB 13.5 10/17/2014   HCT 40 (A) 10/17/2014   PLT 193 10/17/2014   GLUCOSE 96 10/17/2015   CHOL 234 (H) 10/17/2015   TRIG 75.0 10/17/2015   HDL 50.40 10/17/2015   LDLDIRECT 159.0 10/17/2015   LDLCALC 169 (H) 10/17/2015   ALT 20 10/17/2015   AST 20 10/17/2015   NA 132 (L) 10/17/2015   K 4.5 10/17/2015   CL 96 10/17/2015   CREATININE 0.87 10/17/2015   BUN 15 10/17/2015   CO2 28 10/17/2015   TSH 0.66 06/19/2015   PSA 0.64 10/17/2015   HGBA1C 6.2 10/17/2015   MICROALBUR 1.8 10/17/2015      Assessment & Plan:   Problem List Items Addressed This Visit    Hyperlipidemia    He has resumed Crestor 10 mg daily after a suspensions did not resolve his nocturnal muscle cramps and his LDL rose from 82 to 169 without therapy.   Liver enzymes are normal.   Lab Results   Component Value Date   ALT 20 10/17/2015   AST 20 10/17/2015   ALKPHOS 46 10/17/2015   BILITOT 1.3 (H) 10/17/2015         HYPERTENSION, BENIGN    .Elevated today.  Reviewed list of meds, patient is  taking celebrex daily and reports normal home readings.  Advised patient to take toprol at night and follow BPs . Have asked patient to recheck bp at home a minimum of 5 times over the next 4 weeks and call readings to office for adjustment of medications.   Lab Results  Component Value Date   MICROALBUR 1.8 10/17/2015   Lab Results  Component Value Date   CREATININE 0.87 10/17/2015  Overweight (BMI 25.0-29.9)    He is gaining weight despite exercising regularly.  Diet reviewed,  Very healthy.  Recommended paying attention to portion size.       Prediabetes    Managed with low GI diet.  A1c  Has risen from 5.4 to 6.2  Recently, but his BS have been normal.    Lab Results  Component Value Date   HGBA1C 6.2 10/17/2015         SVT (supraventricular tachycardia) (HCC)    Managed with low dose toprol. Recommended taking toprol at night        Other Visit Diagnoses    Arthralgia of forearm, unspecified laterality    -  Primary   Relevant Medications   methylPREDNISolone acetate (DEPO-MEDROL) injection 40 mg (Completed)      I am having Mark Farley maintain his fish oil-omega-3 fatty acids, multivitamin, HYDROcodone-acetaminophen, ferrous sulfate, penciclovir, cyclobenzaprine, omeprazole, diltiazem, lisinopril, amLODipine, traMADol, celecoxib, metoprolol succinate, and rosuvastatin. We administered methylPREDNISolone acetate.  Meds ordered this encounter  Medications  . DISCONTD: ciprofloxacin (CIPRO) 500 MG tablet    Sig: Take 1 tablet (500 mg total) by mouth 2 (two) times daily.    Dispense:  20 tablet    Refill:  0  . DISCONTD: ondansetron (ZOFRAN) 4 MG tablet    Sig: Take 1 tablet (4 mg total) by mouth every 8 (eight) hours as needed for nausea or  vomiting.    Dispense:  20 tablet    Refill:  0  . methylPREDNISolone acetate (DEPO-MEDROL) injection 40 mg    There are no discontinued medications.  Follow-up: No Follow-up on file.   Sherlene ShamsULLO, Weslynn Ke L, MD

## 2016-05-31 NOTE — Assessment & Plan Note (Signed)
He has resumed Crestor 10 mg daily after a suspensions did not resolve his nocturnal muscle cramps and his LDL rose from 82 to 169 without therapy.   Liver enzymes are normal.   Lab Results  Component Value Date   ALT 20 10/17/2015   AST 20 10/17/2015   ALKPHOS 46 10/17/2015   BILITOT 1.3 (H) 10/17/2015

## 2016-05-31 NOTE — Assessment & Plan Note (Signed)
Managed with low dose toprol. Recommended taking toprol at night

## 2016-05-31 NOTE — Assessment & Plan Note (Signed)
He is gaining weight despite exercising regularly.  Diet reviewed,  Very healthy.  Recommended paying attention to portion size.

## 2016-05-31 NOTE — Assessment & Plan Note (Signed)
Managed with low GI diet.  A1c  Has risen from 5.4 to 6.2  Recently, but his BS have been normal.    Lab Results  Component Value Date   HGBA1C 6.2 10/17/2015

## 2016-05-31 NOTE — Assessment & Plan Note (Addendum)
.  Elevated today.  Reviewed list of meds, patient is  taking celebrex daily and reports normal home readings.  Advised patient to take toprol at night and follow BPs . Have asked patient to recheck bp at home a minimum of 5 times over the next 4 weeks and call readings to office for adjustment of medications.   Lab Results  Component Value Date   MICROALBUR 1.8 10/17/2015   Lab Results  Component Value Date   CREATININE 0.87 10/17/2015

## 2016-06-09 ENCOUNTER — Encounter: Payer: Self-pay | Admitting: Internal Medicine

## 2016-08-14 ENCOUNTER — Ambulatory Visit: Payer: TRICARE For Life (TFL) | Admitting: Cardiovascular Disease

## 2016-08-17 NOTE — Progress Notes (Signed)
Cardiology Office Note  Date:  08/18/2016   ID:  Mark Votaw., DOB 1946/01/20, MRN [id number]  PCP:  Sherlene Shams, MD   Chief Complaint  Patient presents with  . other    12 month follow up. Patient states he is doing well. Meds reviewed verbally with patient.     HPI:  58 beats y/o  male with h/o HTN, HL and previous syncope with negative Myoview in 2005. He also has a h/o palpitations and had a Life Watch monitor for several weeks but no events. Monitor only caught a single PVC.  He does have troubles with arthritis, anemia from iron deficiency He presents today for follow-up of his palpitations, hypertension Previous Holter monitor showing runs of SVT, rate up to 150 bpm  In follow-up today he reports that his blood pressure has been running high Lisinopril recently increased up to 20 mg daily by Dr. Darrick Huntsman  even after this change he reports systolic pressures typically 140 up to 150 He feels it is still running high in general  He denies any recent symptoms of Tachycardia 1 recent episode of palpitations lasting 1 minute Did not have to take any pill, resolved on its own. Has not required diltiazem 30 mg doses  Denies any significant chest pain on exertion, no shortness of breath, goes to the gym every morning Exercises without difficulty  Other past medical history reviewed  severe series of tachycardia episodes day after Thanksgiving 2016 Reported he was driving in a car back from a trip, tachycardia seem to happen every 5 minutes. He took several diltiazem, possibly up to 3 or 4 and eventually symptoms seem to improve.  HGA1c 5.4, total cholesterol 151, LDL 70  EKG on today's visit shows normal sinus rhythm with rate 62 bpm, no significant ST or T-wave changes  Other past medical history Holter monitor showing frequent episodes of SVT, short-lived total cholesterol less than 150   PMH:   has a past medical history of Arthritis; Bilateral carpal tunnel  syndrome; COPD (chronic obstructive pulmonary disease) (HCC); Glucose intolerance (impaired glucose tolerance); History of syncope (2005); Hyperlipidemia; Hypertension; Iron deficiency anemia; Osteoarthritis of multiple joints; and Palpitations (2010).  PSH:    Past Surgical History:  Procedure Laterality Date  . COLONOSCOPY      Current Outpatient Prescriptions  Medication Sig Dispense Refill  . amLODipine (NORVASC) 5 MG tablet Take 1 tablet (5 mg total) by mouth daily. 90 tablet 3  . celecoxib (CELEBREX) 200 MG capsule Take 1 capsule (200 mg total) by mouth 2 (two) times daily. As needed for joint pain 180 capsule 3  . ciprofloxacin (CIPRO) 500 MG tablet Take 1 tablet (500 mg total) by mouth 2 (two) times daily. 20 tablet 0  . diltiazem (CARDIZEM) 30 MG tablet Take 1 tablet (30 mg total) by mouth 4 (four) times daily as needed. 90 tablet 3  . ferrous sulfate 325 (65 FE) MG tablet Take 1 tablet (325 mg total) by mouth daily with breakfast. 90 tablet 3  . fish oil-omega-3 fatty acids 1000 MG capsule Take 1 capsule by mouth daily.      Marland Kitchen HYDROcodone-acetaminophen (NORCO/VICODIN) 5-325 MG per tablet Take 1 tablet by mouth every 6 (six) hours as needed for pain. 60 tablet 0  . lisinopril (PRINIVIL,ZESTRIL) 10 MG tablet Take 1 tablet (10 mg total) by mouth daily. 90 tablet 3  . metoprolol succinate (TOPROL-XL) 50 MG 24 hr tablet Take 1 tablet (50 mg total) by mouth daily.  90 tablet 3  . Multiple Vitamin (MULTIVITAMIN) tablet Take 1 tablet by mouth daily.      Marland Kitchen. omeprazole (PRILOSEC) 20 MG capsule Take 1 capsule (20 mg total) by mouth daily. 90 capsule 3  . ondansetron (ZOFRAN) 4 MG tablet Take 1 tablet (4 mg total) by mouth every 8 (eight) hours as needed for nausea or vomiting. 30 tablet 0  . penciclovir (DENAVIR) 1 % cream Apply 1 application topically every 2 (two) hours. As needed for blister 1.5 g 3  . rosuvastatin (CRESTOR) 10 MG tablet Take 1 tablet (10 mg total) by mouth daily. 90 tablet 3   . traMADol (ULTRAM) 50 MG tablet Take 1 tablet (50 mg total) by mouth every 8 (eight) hours as needed. 90 tablet 4   No current facility-administered medications for this visit.      Allergies:   Patient has no known allergies.   Social History:  The patient  reports that he has never smoked. He has never used smokeless tobacco. He reports that he drinks about 7.0 oz of alcohol per week . He reports that he does not use drugs.   Family History:   family history includes Coronary artery disease in his mother; Heart attack in his mother; Heart failure in his father.    Review of Systems: Review of Systems  Constitutional: Negative.   Respiratory: Negative.   Cardiovascular: Positive for palpitations.  Gastrointestinal: Negative.   Musculoskeletal: Negative.   Neurological: Negative.   Psychiatric/Behavioral: Negative.   All other systems reviewed and are negative.    PHYSICAL EXAM: VS:  BP (!) 156/80 (BP Location: Left Arm, Patient Position: Sitting, Cuff Size: Normal)   Pulse 62   Ht 5\' 6"  (1.676 m)   Wt 182 lb 8 oz (82.8 kg)   BMI 29.46 kg/m  , BMI Body mass index is 29.46 kg/m. GEN: Well nourished, well developed, in no acute distress  HEENT: normal  Neck: no JVD, carotid bruits, or masses Cardiac: RRR; no murmurs, rubs, or gallops,no edema  Respiratory:  clear to auscultation bilaterally, normal work of breathing GI: soft, nontender, nondistended, + BS MS: no deformity or atrophy  Skin: warm and dry, no rash Neuro:  Strength and sensation are intact Psych: euthymic mood, full affect    Recent Labs: 10/17/2015: ALT 20; Magnesium 2.0 05/11/2016: BUN 12; Creatinine 0.8; Hemoglobin 14.8; Platelets 170; Potassium 4.3; Sodium 138    Lipid Panel Lab Results  Component Value Date   CHOL 134 05/11/2016   HDL 67 05/11/2016   LDLCALC 70 05/11/2016   TRIG 111 05/11/2016      Wt Readings from Last 3 Encounters:  08/18/16 182 lb 8 oz (82.8 kg)  05/29/16 185 lb 9.6  oz (84.2 kg)  11/22/15 174 lb 8 oz (79.2 kg)       ASSESSMENT AND PLAN:  Mixed hyperlipidemia Cholesterol is at goal on the current lipid regimen. No changes to the medications were made.  HYPERTENSION, BENIGN Blood pressure is well controlled on today's visit. No changes made to the medications.  PVC's (premature ventricular contractions) Rare ectopy, we'll continue metoprolol otherwise is relatively asymptomatic  SVT (supraventricular tachycardia) (HCC) Denies having significant runs of tachycardia concerning for SVT He still has diltiazem that he can take as needed  History of syncope Denies having any near syncope or syncope  Encounter for preventive health examination Discussed screening strategies for him and his wife Discussed CT coronary calcium scoring. Gave him literature to read. He is aggressively  treated and likely does not need screening. He is concerned about his wife who has diabetes, hyperlipidemia   Total encounter time more than 25 minutes  Greater than 50% was spent in counseling and coordination of care with the patient   Disposition:   F/U  12 months  No orders of the defined types were placed in this encounter.    Signed, Dossie Arbour, M.D., Ph.D. 08/18/2016  [facility] [place], [place] [phone]

## 2016-08-18 ENCOUNTER — Ambulatory Visit (INDEPENDENT_AMBULATORY_CARE_PROVIDER_SITE_OTHER): Payer: Medicare Other | Admitting: Cardiovascular Disease

## 2016-08-18 ENCOUNTER — Encounter: Payer: Self-pay | Admitting: Cardiovascular Disease

## 2016-08-18 VITALS — BP 156/80 | HR 62 | Ht 66.0 in | Wt 182.5 lb

## 2016-08-18 DIAGNOSIS — I471 Supraventricular tachycardia, unspecified: Secondary | ICD-10-CM

## 2016-08-18 DIAGNOSIS — I493 Ventricular premature depolarization: Secondary | ICD-10-CM | POA: Diagnosis not present

## 2016-08-18 DIAGNOSIS — Z0000 Encounter for general adult medical examination without abnormal findings: Secondary | ICD-10-CM | POA: Diagnosis not present

## 2016-08-18 DIAGNOSIS — I10 Essential (primary) hypertension: Secondary | ICD-10-CM | POA: Diagnosis not present

## 2016-08-18 DIAGNOSIS — Z87898 Personal history of other specified conditions: Secondary | ICD-10-CM

## 2016-08-18 DIAGNOSIS — E782 Mixed hyperlipidemia: Secondary | ICD-10-CM

## 2016-08-18 MED ORDER — AMLODIPINE BESYLATE 10 MG PO TABS: 10.0000 mg | ORAL_TABLET | Freq: Every day | ORAL | 3 refills | Status: DC

## 2016-08-18 MED ORDER — METOPROLOL SUCCINATE ER 50 MG PO TB24: 50.0000 mg | ORAL_TABLET | Freq: Every day | ORAL | 3 refills | Status: DC

## 2016-08-18 MED ORDER — ROSUVASTATIN CALCIUM 10 MG PO TABS: 10.0000 mg | ORAL_TABLET | Freq: Every day | ORAL | 3 refills | Status: DC

## 2016-08-18 MED ORDER — LISINOPRIL 20 MG PO TABS: 20.0000 mg | ORAL_TABLET | Freq: Every day | ORAL | 3 refills | Status: DC

## 2016-08-18 NOTE — Patient Instructions (Signed)

## 2016-09-04 ENCOUNTER — Encounter: Payer: Self-pay | Admitting: Internal Medicine

## 2016-09-04 ENCOUNTER — Ambulatory Visit (INDEPENDENT_AMBULATORY_CARE_PROVIDER_SITE_OTHER): Payer: Medicare Other | Admitting: Internal Medicine

## 2016-09-04 VITALS — BP 142/90 | HR 58 | Temp 97.9°F | Resp 15 | Ht 66.0 in | Wt 181.8 lb

## 2016-09-04 DIAGNOSIS — M150 Primary generalized (osteo)arthritis: Secondary | ICD-10-CM

## 2016-09-04 DIAGNOSIS — E7800 Pure hypercholesterolemia, unspecified: Secondary | ICD-10-CM

## 2016-09-04 DIAGNOSIS — Z79899 Other long term (current) drug therapy: Secondary | ICD-10-CM

## 2016-09-04 DIAGNOSIS — R7303 Prediabetes: Secondary | ICD-10-CM

## 2016-09-04 DIAGNOSIS — M159 Polyosteoarthritis, unspecified: Secondary | ICD-10-CM

## 2016-09-04 DIAGNOSIS — Z125 Encounter for screening for malignant neoplasm of prostate: Secondary | ICD-10-CM | POA: Diagnosis not present

## 2016-09-04 DIAGNOSIS — I10 Essential (primary) hypertension: Secondary | ICD-10-CM | POA: Diagnosis not present

## 2016-09-04 MED ORDER — CYCLOBENZAPRINE HCL 10 MG PO TABS: 10.0000 mg | ORAL_TABLET | Freq: Three times a day (TID) | ORAL | 3 refills | Status: DC | PRN

## 2016-09-04 MED ORDER — TRAMADOL HCL 50 MG PO TABS: 50.0000 mg | ORAL_TABLET | Freq: Four times a day (QID) | ORAL | 5 refills | Status: DC | PRN

## 2016-09-04 NOTE — Patient Instructions (Addendum)
For your joint pain :  You can increase tylenol to 2000 mg every day  You can increase the tramadol to 4 tablets  Daily  (new rx)   Try alternating immersing of  thumb joints in ice water , and paraffin  Bath  ( 15 minutes each )  Start taking a muscle relaxer at night (cyclobenzaprine) to prevent muscle cramps   Fasting labs prior to annual physical

## 2016-09-04 NOTE — Progress Notes (Signed)
Pre visit review using our clinic review tool, if applicable. No additional management support is needed unless otherwise documented below in the visit note. 

## 2016-09-04 NOTE — Progress Notes (Signed)
Subjective:  Patient ID: Mark Pillow., male    DOB: 1946-03-07  Age: 71 y.o. MRN: [id number]  CC: The primary encounter diagnosis was Pure hypercholesterolemia. Diagnoses of Prediabetes, Prostate cancer screening, Long-term use of high-risk medication, Primary osteoarthritis involving multiple joints, and HYPERTENSION, BENIGN were also pertinent to this visit.  HPI Mark Farley. presents for  management of joint pain   Prior NSAID trials: etodolac, nabumetone,  celebrex   Thumb joint pain: Had IA injections of both thumbs at the [place].  Felt ok initally but then started aching which lasted a few hours before resolving.  Had  4 weeks of therapeutic effect.  Still taking celebrex  Twice daily,  Tramadol twice daily,  tylenol twice daily   hand pain complicated by bilateral CTS  By EMG/[place] studies.  Hands cramp up at night   2) leg cramps at night severe,  Lateral calf and hamstring and inner thigh.  History of baker's cyst occurring several times per week aggravated by yard work  3) hip pain that radiates to knee on left side,  Aggravated by driving    5) home bps are elevated at home,  Usually in the evening   Lab Results  Component Value Date   PSA 0.60 05/11/2016   PSA 0.64 10/17/2015   PSA 0.59 10/17/2014        Outpatient Medications Prior to Visit  Medication Sig Dispense Refill  . amLODipine (NORVASC) 10 MG tablet Take 1 tablet (10 mg total) by mouth daily. (Patient taking differently: Take 10 mg by mouth daily. ) 90 tablet 3  . celecoxib (CELEBREX) 200 MG capsule Take 1 capsule (200 mg total) by mouth 2 (two) times daily. As needed for joint pain 180 capsule 3  . diltiazem (CARDIZEM) 30 MG tablet Take 1 tablet (30 mg total) by mouth 4 (four) times daily as needed. 90 tablet 3  . ferrous sulfate 325 (65 FE) MG tablet Take 1 tablet (325 mg total) by mouth daily with breakfast. 90 tablet 3  . fish oil-omega-3 fatty acids 1000 MG capsule Take 1 capsule by mouth  daily.      Marland Kitchen lisinopril (PRINIVIL,ZESTRIL) 20 MG tablet Take 1 tablet (20 mg total) by mouth daily. 90 tablet 3  . metoprolol succinate (TOPROL-XL) 50 MG 24 hr tablet Take 1 tablet (50 mg total) by mouth daily. 90 tablet 3  . Multiple Vitamin (MULTIVITAMIN) tablet Take 1 tablet by mouth daily.      Marland Kitchen omeprazole (PRILOSEC) 20 MG capsule Take 1 capsule (20 mg total) by mouth daily. 90 capsule 3  . ondansetron (ZOFRAN) 4 MG tablet Take 1 tablet (4 mg total) by mouth every 8 (eight) hours as needed for nausea or vomiting. 30 tablet 0  . penciclovir (DENAVIR) 1 % cream Apply 1 application topically every 2 (two) hours. As needed for blister 1.5 g 3  . rosuvastatin (CRESTOR) 10 MG tablet Take 1 tablet (10 mg total) by mouth daily. 90 tablet 3  . traMADol (ULTRAM) 50 MG tablet Take 1 tablet (50 mg total) by mouth every 8 (eight) hours as needed. 90 tablet 4  . ciprofloxacin (CIPRO) 500 MG tablet Take 1 tablet (500 mg total) by mouth 2 (two) times daily. (Patient not taking: Reported on 09/04/2016) 20 tablet 0  . HYDROcodone-acetaminophen (NORCO/VICODIN) 5-325 MG per tablet Take 1 tablet by mouth every 6 (six) hours as needed for pain. (Patient not taking: Reported on 09/04/2016) 60 tablet 0   No  facility-administered medications prior to visit.     Review of Systems;  Patient denies headache, fevers, malaise, unintentional weight loss, skin rash, eye pain, sinus congestion and sinus pain, sore throat, dysphagia,  hemoptysis , cough, dyspnea, wheezing, chest pain, palpitations, orthopnea, edema, abdominal pain, nausea, melena, diarrhea, constipation, flank pain, dysuria, hematuria, urinary  Frequency, nocturia, numbness, tingling, seizures,  Focal weakness, Loss of consciousness,  Tremor, insomnia, depression, anxiety, and suicidal ideation.      Objective:  BP (!) 142/90   Pulse (!) 58   Temp 97.9 F (36.6 C) (Oral)   Resp 15   Ht  (1.676 m)   Wt 181 lb 12.8 oz (82.5 kg)   SpO2 97%   BMI  29.34 kg/m   BP Readings from Last 3 Encounters:  09/04/16 (!) 142/90  08/18/16 (!) 156/80  05/29/16 (!) 152/80    Wt Readings from Last 3 Encounters:  09/04/16 181 lb 12.8 oz (82.5 kg)  08/18/16 182 lb 8 oz (82.8 kg)  05/29/16 185 lb 9.6 oz (84.2 kg)    General appearance: alert, cooperative and appears stated age Ears: normal TM's and external ear canals both ears Throat: lips, mucosa, and tongue normal; teeth and gums normal Neck: no adenopathy, no carotid bruit, supple, symmetrical, trachea midline and thyroid not enlarged, symmetric, no tenderness/mass/nodules Back: symmetric, no curvature. ROM normal. No CVA tenderness. Lungs: clear to auscultation bilaterally Heart: regular rate and rhythm, S1, S2 normal, no murmur, click, rub or gallop Abdomen: soft, non-tender; bowel sounds normal; no masses,  no organomegaly Pulses: 2+ and symmetric Skin: Skin color, texture, turgor normal. No rashes or lesions Lymph nodes: Cervical, supraclavicular, and axillary nodes normal.  Lab Results  Component Value Date   HGBA1C 5.7 05/11/2016   HGBA1C 6.2 10/17/2015   HGBA1C 5.4 10/17/2014    Lab Results  Component Value Date   CREATININE 0.8 05/11/2016   CREATININE 0.87 10/17/2015   CREATININE 0.9 06/19/2015    Lab Results  Component Value Date   WBC 5.6 05/11/2016   HGB 14.8 05/11/2016   HCT 43 05/11/2016   PLT 170 05/11/2016   GLUCOSE 96 10/17/2015   CHOL 134 05/11/2016   TRIG 111 05/11/2016   HDL 67 05/11/2016   LDLDIRECT 159.0 10/17/2015   LDLCALC 70 05/11/2016   ALT 20 10/17/2015   AST 20 10/17/2015   NA 138 05/11/2016   K 4.3 05/11/2016   CL 96 10/17/2015   CREATININE 0.8 05/11/2016   BUN 12 05/11/2016   CO2 28 10/17/2015   TSH 0.66 06/19/2015   PSA 0.60 05/11/2016   HGBA1C 5.7 05/11/2016   MICROALBUR 1.8 10/17/2015     Assessment & Plan:   Problem List Items Addressed This Visit    Hyperlipidemia - Primary    He has resumed Crestor 10 mg daily after a  suspensiondid not resolve his nocturnal muscle cramps and his LDL rose from 82 to 169 without therapy.   Liver enzymes are normal.   Lab Results  Component Value Date   ALT 20 10/17/2015   AST 20 10/17/2015   ALKPHOS 46 10/17/2015   BILITOT 1.3 (H) 10/17/2015         Relevant Orders   Lipid panel   HYPERTENSION, BENIGN    Well controlled on current regimen. Renal function stable, no changes today.  Lab Results  Component Value Date   CREATININE 0.8 05/11/2016   Lab Results  Component Value Date   NA 138 05/11/2016   K  4.3 05/11/2016   CL 96 10/17/2015   CO2 28 10/17/2015         Osteoarthritis    Advised to increase tramadol to 4 times daily and tylenol to 2000 mg daily       Relevant Medications   traMADol (ULTRAM) 50 MG tablet   cyclobenzaprine (FLEXERIL) 10 MG tablet   Prediabetes    Managed with low GI diet.  A1c  Has decreased from 6.2 to 5.7    Lab Results  Component Value Date   HGBA1C 5.7 05/11/2016         Relevant Orders   Hemoglobin A1c    Other Visit Diagnoses    Prostate cancer screening       Long-term use of high-risk medication       Relevant Orders   Comprehensive metabolic panel      I have discontinued Mr. Wooden's HYDROcodone-acetaminophen and ciprofloxacin. I have also changed his traMADol. Additionally, I am having him start on cyclobenzaprine. Lastly, I am having him maintain his fish oil-omega-3 fatty acids, multivitamin, ferrous sulfate, penciclovir, omeprazole, diltiazem, celecoxib, ondansetron, amLODipine, rosuvastatin, metoprolol succinate, and lisinopril.  Meds ordered this encounter  Medications  . traMADol (ULTRAM) 50 MG tablet    Sig: Take 1 tablet (50 mg total) by mouth every 6 (six) hours as needed.    Dispense:  120 tablet    Refill:  5  . cyclobenzaprine (FLEXERIL) 10 MG tablet    Sig: Take 1 tablet (10 mg total) by mouth 3 (three) times daily as needed for muscle spasms.    Dispense:  90 tablet    Refill:  3     Medications Discontinued During This Encounter  Medication Reason  . ciprofloxacin (CIPRO) 500 MG tablet Therapy completed  . HYDROcodone-acetaminophen (NORCO/VICODIN) 5-325 MG per tablet Patient has not taken in last 30 days  . traMADol (ULTRAM) 50 MG tablet Reorder    Follow-up: Return in about 3 months (around 12/04/2016) for CPE PER PATIENT PREFERENCE.   Sherlene Shams, MD

## 2016-09-06 NOTE — Assessment & Plan Note (Addendum)
Advised to increase tramadol to 4 times daily and tylenol to 2000 mg daily

## 2016-09-06 NOTE — Assessment & Plan Note (Signed)
Managed with low GI diet.  A1c  Has decreased from 6.2 to 5.7    Lab Results  Component Value Date   HGBA1C 5.7 05/11/2016

## 2016-09-06 NOTE — Assessment & Plan Note (Signed)
Well controlled on current regimen. Renal function stable, no changes today.  Lab Results  Component Value Date   CREATININE 0.8 05/11/2016   Lab Results  Component Value Date   NA 138 05/11/2016   K 4.3 05/11/2016   CL 96 10/17/2015   CO2 28 10/17/2015

## 2016-09-06 NOTE — Assessment & Plan Note (Signed)
He has resumed Crestor 10 mg daily after a suspensiondid not resolve his nocturnal muscle cramps and his LDL rose from 82 to 169 without therapy.   Liver enzymes are normal.   Lab Results  Component Value Date   ALT 20 10/17/2015   AST 20 10/17/2015   ALKPHOS 46 10/17/2015   BILITOT 1.3 (H) 10/17/2015

## 2016-11-02 ENCOUNTER — Telehealth: Payer: Self-pay | Admitting: Internal Medicine

## 2016-11-03 ENCOUNTER — Encounter: Payer: Self-pay | Admitting: Internal Medicine

## 2016-11-05 MED ORDER — CYCLOBENZAPRINE HCL 10 MG PO TABS: 10.0000 mg | ORAL_TABLET | Freq: Three times a day (TID) | ORAL | 3 refills | Status: DC | PRN

## 2016-11-05 NOTE — Telephone Encounter (Signed)
Mark Farley,  I am unfortable writing for 270 flexeril at one time. If you re only using the medication "as needed" less than once daily, then 90 pills should really suffice for a 90 day supply, even

## 2016-11-06 ENCOUNTER — Other Ambulatory Visit: Payer: Self-pay | Admitting: Internal Medicine

## 2016-11-06 MED ORDER — CYCLOBENZAPRINE HCL 10 MG PO TABS: 10.0000 mg | ORAL_TABLET | Freq: Three times a day (TID) | ORAL | 3 refills | Status: DC | PRN

## 2016-11-09 ENCOUNTER — Telehealth: Payer: Self-pay

## 2016-11-09 NOTE — Telephone Encounter (Signed)
Pt called back, I advised him of the rx being ready for pick up.

## 2016-11-09 NOTE — Telephone Encounter (Signed)
LMTCB. Need to let pt know that his flexeril rx is up front to be picked up.

## 2016-11-24 ENCOUNTER — Encounter: Payer: Self-pay | Admitting: Internal Medicine

## 2016-11-25 MED ORDER — CELECOXIB 200 MG PO CAPS: 200.0000 mg | ORAL_CAPSULE | Freq: Two times a day (BID) | ORAL | 3 refills | Status: DC

## 2016-12-21 ENCOUNTER — Other Ambulatory Visit: Payer: Medicare Other

## 2016-12-23 ENCOUNTER — Encounter: Payer: Medicare Other | Admitting: Internal Medicine

## 2017-01-11 ENCOUNTER — Telehealth: Payer: Self-pay | Admitting: Internal Medicine

## 2017-01-11 NOTE — Telephone Encounter (Signed)
Left pt message asking to call Revonda Standardllison back directly at [phone] to schedule AWV. Thanks!  *NOTE* Last AWV 11/22/15

## 2017-01-21 NOTE — Telephone Encounter (Signed)
Scheduled 03/05/17

## 2017-02-01 ENCOUNTER — Encounter: Payer: Self-pay | Admitting: Cardiovascular Disease

## 2017-02-02 ENCOUNTER — Other Ambulatory Visit: Payer: Self-pay | Admitting: *Deleted

## 2017-02-02 MED ORDER — METOPROLOL SUCCINATE ER 50 MG PO TB24: 50.0000 mg | ORAL_TABLET | Freq: Every day | ORAL | 3 refills | Status: DC

## 2017-02-02 MED ORDER — ROSUVASTATIN CALCIUM 10 MG PO TABS: 10.0000 mg | ORAL_TABLET | Freq: Every day | ORAL | 3 refills | Status: DC

## 2017-02-04 LAB — BASIC METABOLIC PANEL
BUN: 16 (ref 4–21)
Creatinine: 0.9 (ref 0.6–1.3)
Glucose: 104
Potassium: 4.1 (ref 3.4–5.3)
Sodium: 138 (ref 137–147)

## 2017-02-04 LAB — PSA: PSA: 0.6

## 2017-02-04 LAB — LIPID PANEL
CHOLESTEROL: 140 (ref 0–200)
HDL: 50 (ref 35–70)
TRIGLYCERIDES: 201 — AB (ref 40–160)

## 2017-02-04 LAB — CBC AND DIFFERENTIAL
HEMATOCRIT: 42 (ref 41–53)
HEMOGLOBIN: 14.4 (ref 13.5–17.5)
PLATELETS: 178 (ref 150–399)
WBC: 5.1

## 2017-02-04 LAB — VITAMIN D 25 HYDROXY (VIT D DEFICIENCY, FRACTURES): VIT D 25 HYDROXY: 29.4

## 2017-02-04 LAB — HEPATIC FUNCTION PANEL
ALK PHOS: 60 (ref 25–125)
ALT: 73 — AB (ref 10–40)
AST: 46 — AB (ref 14–40)
Bilirubin, Total: 1.2

## 2017-02-04 LAB — TSH: TSH: 0.82 (ref 0.41–5.90)

## 2017-02-04 LAB — HEMOGLOBIN A1C: Hemoglobin A1C: 6

## 2017-02-16 ENCOUNTER — Telehealth: Payer: Self-pay | Admitting: Internal Medicine

## 2017-02-16 NOTE — Telephone Encounter (Signed)
Pt wife dropped off lab results from the [place]. Placed in colored folder

## 2017-02-16 NOTE — Telephone Encounter (Signed)
Placed in yellow results folder.  °

## 2017-02-21 ENCOUNTER — Telehealth: Payer: Self-pay | Admitting: Internal Medicine

## 2017-02-21 NOTE — Telephone Encounter (Signed)
MyChart message sent  And outside labs abstracted.

## 2017-02-24 NOTE — Telephone Encounter (Signed)
Error

## 2017-02-26 NOTE — Telephone Encounter (Signed)
Error

## 2017-03-05 ENCOUNTER — Ambulatory Visit (INDEPENDENT_AMBULATORY_CARE_PROVIDER_SITE_OTHER): Payer: Medicare Other

## 2017-03-05 ENCOUNTER — Other Ambulatory Visit: Payer: Medicare Other

## 2017-03-05 VITALS — BP 130/70 | HR 84 | Temp 98.3°F | Resp 12 | Ht 66.0 in | Wt 187.4 lb

## 2017-03-05 DIAGNOSIS — Z23 Encounter for immunization: Secondary | ICD-10-CM

## 2017-03-05 DIAGNOSIS — Z0000 Encounter for general adult medical examination without abnormal findings: Secondary | ICD-10-CM | POA: Diagnosis not present

## 2017-03-05 DIAGNOSIS — Z1331 Encounter for screening for depression: Secondary | ICD-10-CM

## 2017-03-05 NOTE — Progress Notes (Signed)
Subjective:   Mark Farley. is a 71 y.o. male who presents for Medicare Annual/Subsequent preventive examination.  Review of Systems:  No ROS.  Medicare Wellness Visit. Additional risk factors are reflected in the social history.  Cardiac Risk Factors include: advanced age (>43men, >30 women);male gender;hypertension     Objective:    Vitals: BP 130/70 (BP Location: Left Arm, Patient Position: Sitting, Cuff Size: Normal)   Pulse 84   Temp 98.3 F (36.8 C) (Oral)   Resp 12   Ht  (1.676 m)   Wt 187 lb 6.4 oz (85 kg)   SpO2 96%   BMI 30.25 kg/m   Body mass index is 30.25 kg/m.  Tobacco History  Smoking Status  . Never Smoker  Smokeless Tobacco  . Never Used     Counseling given: Not Answered   Past Medical History:  Diagnosis Date  . Arthritis    ? Gout  . Bilateral carpal tunnel syndrome    managed with nocturnal braces x 3 years  . COPD (chronic obstructive pulmonary disease) (HCC)   . Glucose intolerance (impaired glucose tolerance)   . History of syncope 2005   Unexplained, myoview negative  . Hyperlipidemia   . Hypertension   . Iron deficiency anemia   . Osteoarthritis of multiple joints   . Palpitations 2010   Life Watch with single PVC   Past Surgical History:  Procedure Laterality Date  . COLONOSCOPY     Family History  Problem Relation Age of Onset  . Coronary artery disease Mother   . Heart attack Mother        MI  . Heart failure Father        CHF  . Colon cancer Brother   . Colon cancer Paternal Grandmother    History  Sexual Activity  . Sexual activity: Yes    Outpatient Encounter Prescriptions as of 03/05/2017  Medication Sig  . amLODipine (NORVASC) 10 MG tablet Take 1 tablet (10 mg total) by mouth daily. (Patient taking differently: Take 10 mg by mouth daily. )  . celecoxib (CELEBREX) 200 MG capsule Take 1 capsule (200 mg total) by mouth 2 (two) times daily. As needed for joint pain  . cyclobenzaprine (FLEXERIL) 10  MG tablet Take 1 tablet (10 mg total) by mouth 3 (three) times daily as needed for muscle spasms.  Marland Kitchen diltiazem (CARDIZEM) 30 MG tablet Take 1 tablet (30 mg total) by mouth 4 (four) times daily as needed.  . fish oil-omega-3 fatty acids 1000 MG capsule Take 1 capsule by mouth daily.    Marland Kitchen lisinopril (PRINIVIL,ZESTRIL) 20 MG tablet Take 1 tablet (20 mg total) by mouth daily.  . metoprolol succinate (TOPROL-XL) 50 MG 24 hr tablet Take 1 tablet (50 mg total) by mouth daily.  . Multiple Vitamin (MULTIVITAMIN) tablet Take 1 tablet by mouth daily.    Marland Kitchen omeprazole (PRILOSEC) 20 MG capsule Take 1 capsule (20 mg total) by mouth daily.  . ondansetron (ZOFRAN) 4 MG tablet Take 1 tablet (4 mg total) by mouth every 8 (eight) hours as needed for nausea or vomiting.  . penciclovir (DENAVIR) 1 % cream Apply 1 application topically every 2 (two) hours. As needed for blister  . rosuvastatin (CRESTOR) 10 MG tablet Take 1 tablet (10 mg total) by mouth daily.  . traMADol (ULTRAM) 50 MG tablet Take 1 tablet (50 mg total) by mouth every 6 (six) hours as needed.  . [DISCONTINUED] ferrous sulfate 325 (65 FE) MG tablet  Take 1 tablet (325 mg total) by mouth daily with breakfast.   No facility-administered encounter medications on file as of 03/05/2017.     Activities of Daily Living In your present state of health, do you have any difficulty performing the following activities: 03/05/2017 09/04/2016  Hearing? N N  Vision? N N  Difficulty concentrating or making decisions? N N  Walking or climbing stairs? N N  Dressing or bathing? N N  Doing errands, shopping? N N  Preparing Food and eating ? N -  Using the Toilet? N -  In the past six months, have you accidently leaked urine? N -  Do you have problems with loss of bowel control? N -  Managing your Medications? N -  Managing your Finances? N -  Housekeeping or managing your Housekeeping? N -  Some recent data might be hidden    Patient Care Team: Sherlene Shams,  MD as PCP - General (Internal Medicine) Antonieta Iba, MD as Consulting Physician (Cardiology)   Assessment:    This is a routine wellness examination for Mark Farley. The goal of the wellness visit is to assist the patient how to close the gaps in care and create a preventative care plan for the patient.   The roster of all physicians providing medical care to patient is listed in the Snapshot section of the chart.  Taking calcium VIT D as appropriate/Osteoporosis risk reviewed.    Safety issues reviewed; Smoke and carbon monoxide detectors in the home. No firearms in the home.  Wears seatbelts when driving or riding with others. Patient does wear sunscreen or protective clothing when in direct sunlight. No violence in the home.  Depression- PHQ 2 &9 complete.  No signs/symptoms or verbal communication regarding little pleasure in doing things, feeling down, depressed or hopeless. No changes in sleeping, energy, eating, concentrating.  No thoughts of self harm or harm towards others.  Time spent on this topic is 8 minutes.   Patient is alert, normal appearance, oriented to person/place/and time.  Correctly identified the president of the [place], recall of 3/3 words, and performing simple calculations. Displays appropriate judgement and can read correct time from watch face.   No new identified risk were noted.  No failures at ADL's or IADL's.   BMI- discussed the importance of a healthy diet, water intake and the benefits of aerobic exercise. Educational material provided.   24 hour diet recall: Breakfast: 2 pieces of bacon, 2 scrambled eggs Lunch: tuna fish macaroni salad Dinner: hamburger, green beans, tater tots  Daily fluid intake: 5 cups decaf soda, 3 cups of water  Dental- every 6 months.  Dr. Daily.  Eye- Visual acuity not assessed per patient preference since they have regular follow up with the ophthalmologist.  Wears corrective lenses.  Sleep patterns- Sleeps 6 hours at  night.  Wakes feeling rested.  High dose influenza vaccine administered R deltoid, tolerated well. Educational material provided.  Health maintenance gaps- closed.  Patient Concerns: None at this time. Follow up with PCP as needed.  Exercise Activities and Dietary recommendations Current Exercise Habits: Home exercise routine, Type of exercise: calisthenics;strength training/weights;treadmill;walking;stretching (Pedal pusher), Time (Minutes): > 60, Frequency (Times/Week): 5, Weekly Exercise (Minutes/Week): 0, Intensity: Moderate  Goals    . Increase physical activity          Strength building Gain flexibility through stretching       Fall Risk Fall Risk  03/05/2017 05/29/2016 11/22/2015  Falls in the past year? No No  No   Depression Screen PHQ 2/9 Scores 03/05/2017 05/29/2016 11/22/2015  PHQ - 2 Score 0 0 0  PHQ- 9 Score 0 - -    Cognitive Function MMSE - Mini Mental State Exam 03/05/2017  Orientation to time 5  Orientation to Place 5  Registration 3  Attention/ Calculation 5  Recall 3  Language- name 2 objects 2  Language- repeat 1  Language- follow 3 step command 3  Language- read & follow direction 1  Write a sentence 1  Copy design 1  Total score 30        Immunization History  Administered Date(s) Administered  . Influenza Split 02/21/2008, 03/11/2011  . Influenza, High Dose Seasonal PF 03/05/2017  . Influenza,inj,Quad PF,6+ Mos 02/24/2014, 05/09/2015  . Influenza-Unspecified 04/01/2012, 04/03/2013, 02/24/2016  . Pneumococcal Conjugate-13 04/09/2014  . Pneumococcal Polysaccharide-23 04/18/2001, 03/11/2011  . Tdap 10/27/2012  . Tetanus 09/05/2008  . Zoster 02/21/2008   Screening Tests Health Maintenance  Topic Date Due  . TETANUS/TDAP  10/28/2022  . COLONOSCOPY  01/10/2025  . INFLUENZA VACCINE  Completed  . Hepatitis C Screening  Completed  . PNA vac Low Risk Adult  Completed      Plan:   End of life planning; Advanced aging; Advanced  directives discussed.  No HCPOA/Living Will.  Additional information declined at this time.  I have personally reviewed and noted the following in the patient's chart:   . Medical and social history . Use of alcohol, tobacco or illicit drugs  . Current medications and supplements . Functional ability and status . Nutritional status . Physical activity . Advanced directives . List of other physicians . Hospitalizations, surgeries, and ER visits in previous 12 months . Vitals . Screenings to include cognitive, depression, and falls . Referrals and appointments  In addition, I have reviewed and discussed with patient certain preventive protocols, quality metrics, and best practice recommendations. A written personalized care plan for preventive services as well as general preventive health recommendations were provided to patient.     OBrien-Blaney, Kylii Ennis L, LPN  16/06/958    I have reviewed the above information and agree with above.   Duncan Dull, MD

## 2017-03-05 NOTE — Patient Instructions (Addendum)
Mark Farley , Thank you for taking time to come for your Medicare Wellness Visit. I appreciate your ongoing commitment to your health goals. Please review the following plan we discussed and let me know if I can assist you in the future.   Follow up with Dr. Darrick Huntsman as needed.    Have a great day!  These are the goals we discussed: Goals    . Increase physical activity          Strength building Gain flexibility through stretching        This is a list of the screening recommended for you and due dates:  Health Maintenance  Topic Date Due  . Tetanus Vaccine  10/28/2022  . Colon Cancer Screening  01/10/2025  . Flu Shot  Completed  .  Hepatitis C: One time screening is recommended by Center for Disease Control  (CDC) for  adults born from [birth year] through 1965.   Completed  . Pneumonia vaccines  Completed      Hearing Loss Hearing loss is a partial or total loss of the ability to hear. This can be temporary or permanent, and it can happen in one or both ears. Hearing loss may be referred to as deafness. Medical care is necessary to treat hearing loss properly and to prevent the condition from getting worse. Your hearing may partially or completely come back, depending on what caused your hearing loss and how severe it is. In some cases, hearing loss is permanent. What are the causes? Common causes of hearing loss include:  Too much wax in the ear canal.  Infection of the ear canal or middle ear.  Fluid in the middle ear.  Injury to the ear or surrounding area.  An object stuck in the ear.  Prolonged exposure to loud sounds, such as music.  Less common causes of hearing loss include:  Tumors in the ear.  Viral or bacterial infections, such as meningitis.  A hole in the eardrum (perforated eardrum).  Problems with the hearing nerve that sends signals between the brain and the ear.  Certain medicines.  What are the signs or symptoms? Symptoms of this condition may  include:  Difficulty telling the difference between sounds.  Difficulty following a conversation when there is background noise.  Lack of response to sounds in your environment. This may be most noticeable when you do not respond to startling sounds.  Needing to turn up the volume on the television, radio, etc.  Ringing in the ears.  Dizziness.  Pain in the ears.  How is this diagnosed? This condition is diagnosed based on a physical exam and a hearing test (audiometry). The audiometry test will be performed by a hearing specialist (audiologist). You may also be referred to an ear, nose, and throat (ENT) specialist (otolaryngologist). How is this treated? Treatment for recent onset of hearing loss may include:  Ear wax removal.  Being prescribed medicines to prevent infection (antibiotics).  Being prescribed medicines to reduce inflammation (corticosteroids).  Follow these instructions at home:  If you were prescribed an antibiotic medicine, take it as told by your health care provider. Do not stop taking the antibiotic even if you start to feel better.  Take over-the-counter and prescription medicines only as told by your health care provider.  Avoid loud noises.  Return to your normal activities as told by your health care provider. Ask your health care provider what activities are safe for you.  Keep all follow-up visits as told by  your health care provider. This is important. Contact a health care provider if:  You feel dizzy.  You develop new symptoms.  You vomit or feel nauseous.  You have a fever. Get help right away if:  You develop sudden changes in your vision.  You have severe ear pain.  You have new or increased weakness.  You have a severe headache. This information is not intended to replace advice given to you by your health care provider. Make sure you discuss any questions you have with your health care provider. Document Released: 05/18/2005  Document Revised: 10/24/2015 Document Reviewed: 10/03/2014 Elsevier Interactive Patient Education  2018 [employer].

## 2017-03-08 ENCOUNTER — Telehealth: Payer: Self-pay | Admitting: *Deleted

## 2017-03-08 ENCOUNTER — Ambulatory Visit (INDEPENDENT_AMBULATORY_CARE_PROVIDER_SITE_OTHER): Payer: Medicare Other | Admitting: Internal Medicine

## 2017-03-08 ENCOUNTER — Encounter: Payer: Self-pay | Admitting: Internal Medicine

## 2017-03-08 VITALS — BP 152/88 | HR 63 | Temp 98.2°F | Resp 15 | Ht 66.0 in | Wt 188.0 lb

## 2017-03-08 DIAGNOSIS — R740 Nonspecific elevation of levels of transaminase and lactic acid dehydrogenase [LDH]: Secondary | ICD-10-CM

## 2017-03-08 DIAGNOSIS — G252 Other specified forms of tremor: Secondary | ICD-10-CM

## 2017-03-08 DIAGNOSIS — E7800 Pure hypercholesterolemia, unspecified: Secondary | ICD-10-CM

## 2017-03-08 DIAGNOSIS — M2241 Chondromalacia patellae, right knee: Secondary | ICD-10-CM | POA: Diagnosis not present

## 2017-03-08 DIAGNOSIS — I10 Essential (primary) hypertension: Secondary | ICD-10-CM

## 2017-03-08 DIAGNOSIS — E785 Hyperlipidemia, unspecified: Secondary | ICD-10-CM | POA: Diagnosis not present

## 2017-03-08 DIAGNOSIS — Z800 Family history of malignant neoplasm of digestive organs: Secondary | ICD-10-CM

## 2017-03-08 DIAGNOSIS — R7303 Prediabetes: Secondary | ICD-10-CM | POA: Diagnosis not present

## 2017-03-08 DIAGNOSIS — H6122 Impacted cerumen, left ear: Secondary | ICD-10-CM | POA: Diagnosis not present

## 2017-03-08 DIAGNOSIS — R5383 Other fatigue: Secondary | ICD-10-CM

## 2017-03-08 DIAGNOSIS — R7401 Elevation of levels of liver transaminase levels: Secondary | ICD-10-CM

## 2017-03-08 DIAGNOSIS — Z0001 Encounter for general adult medical examination with abnormal findings: Secondary | ICD-10-CM

## 2017-03-08 DIAGNOSIS — R202 Paresthesia of skin: Secondary | ICD-10-CM | POA: Diagnosis not present

## 2017-03-08 DIAGNOSIS — H6120 Impacted cerumen, unspecified ear: Secondary | ICD-10-CM | POA: Insufficient documentation

## 2017-03-08 DIAGNOSIS — Z0000 Encounter for general adult medical examination without abnormal findings: Secondary | ICD-10-CM

## 2017-03-08 LAB — CK: CK TOTAL: 96 U/L (ref 7–232)

## 2017-03-08 LAB — HEPATIC FUNCTION PANEL
ALT: 43 U/L (ref 0–53)
AST: 32 U/L (ref 0–37)
Albumin: 4.6 g/dL (ref 3.5–5.2)
Alkaline Phosphatase: 50 U/L (ref 39–117)
BILIRUBIN DIRECT: 0.2 mg/dL (ref 0.0–0.3)
BILIRUBIN TOTAL: 0.9 mg/dL (ref 0.2–1.2)
Total Protein: 7.4 g/dL (ref 6.0–8.3)

## 2017-03-08 LAB — LDL CHOLESTEROL, DIRECT: Direct LDL: 81 mg/dL

## 2017-03-08 NOTE — Telephone Encounter (Signed)
Pt requested to have labs ordered prior to his physical appt on 03-09-18

## 2017-03-08 NOTE — Progress Notes (Addendum)
Patient ID: Mark Farley., male    DOB: Dec 08, 1945  Age: 71 y.o. MRN: [id number]  The patient is here for annual  preventive examination and management of other chronic and acute problems.   Health Maintenance screenings reviewed  Last colonoscopy was done in 2016 . To be repeated  Every 5 years due to FH of colon CA   PSA up to date   The risk factors are reflected in the social history.  The roster of all physicians providing medical care to patient - is listed in the Snapshot section of the chart.  Activities of daily living:  The patient is 100% independent in all ADLs: dressing, toileting, feeding as well as independent mobility  Home safety : The patient has smoke detectors in the home. They wear seatbelts.  There are no firearms at home. There is no violence in the home.   There is no risks for hepatitis, STDs or HIV. There is no   history of blood transfusion. They have no travel history to infectious disease endemic areas of the world.  The patient has seen their dentist in the last six month. They have seen their eye doctor in the last year.  They do  have modferate  sun exposure. Discussed the need for sun protection: hats, long sleeves and use of sunscreen if there is significant sun exposure.   Diet: the importance of a healthy diet is discussed. They do have a healthy diet.  The benefits of regular aerobic exercise were discussed. He exercises 5 to 6 times per week at a local gym , mostly cardio . 60 minutes daily    Depression screen: there are no signs or vegative symptoms of depression- irritability, change in appetite, anhedonia, sadness/tearfullness.  Cognitive assessment: the patient manages all their financial and personal affairs and is actively engaged. They could relate day,date,year and events; recalled 2/3 objects at 3 minutes; performed clock-face test normally.  The following portions of the patient's history were reviewed and updated as appropriate:  allergies, current medications, past family history, past medical history,  past surgical history, past social history  and problem list.  Visual acuity was not assessed per patient preference since she has regular follow up with her ophthalmologist. Hearing and body mass index were assessed and reviewed.   During the course of the visit the patient was educated and counseled about appropriate screening and preventive services including : fall prevention , diabetes screening, nutrition counseling, colorectal cancer screening, and recommended immunizations.    CC: The primary encounter diagnosis was Elevated ALT measurement. Diagnoses of Hearing loss of left ear due to cerumen impaction, Hyperlipidemia LDL goal <130, Family history of colon cancer requiring screening colonoscopy, Prediabetes, Intention tremor, Tingling of left upper extremity, Runner's knee, right, HYPERTENSION, BENIGN, and Encounter for preventive health examination were also pertinent to this visit.  Left ear has been "blocked" with earwax despite using Debrox regularly for the past 2 weeks,  Notes diminished hearing acutely.   Left hand tingling involving fingers 1.2 and 3,  Tingles All the way to the elbow . Has developed infrequent involuntary adduction of the thumb to the palm .  Has a   History of carpal tunnel syndrome, mild in the right hand by prior EMG studies with no history of surgery but prior steroid injections to wrist.  Is schedule to see the  [facility] neurologist on oct 23 .  No loss of strength.   1 month history of right knee pain.  Has been diagnosed with  "runners knee" in the past .  Taking celebrex twice daily  Only hurts at home going up stairs.  Does not hurt while using the stair master or while walking  several miles daily   New onset Intention tremor involDictation #1 [id number]  [id number] ivng the right hand.  Occurs only with eating only.  Not at rest    story Gilberto has a past medical history of  Arthritis; Bilateral carpal tunnel syndrome; COPD (chronic obstructive pulmonary disease) (HCC); Glucose intolerance (impaired glucose tolerance); History of syncope (2005); Hyperlipidemia; Hypertension; Iron deficiency anemia; Osteoarthritis of multiple joints; and Palpitations (2010).   He has a past surgical history that includes Colonoscopy.   His family history includes Colon cancer in his brother and paternal grandmother; Coronary artery disease in his mother; Heart attack in his mother; Heart failure in his father.He reports that he has never smoked. He has never used smokeless tobacco. He reports that he drinks about 7.0 oz of alcohol per week . He reports that he does not use drugs.  Outpatient Medications Prior to Visit  Medication Sig Dispense Refill  . amLODipine (NORVASC) 10 MG tablet Take 1 tablet (10 mg total) by mouth daily. (Patient taking differently: Take 10 mg by mouth daily. ) 90 tablet 3  . celecoxib (CELEBREX) 200 MG capsule Take 1 capsule (200 mg total) by mouth 2 (two) times daily. As needed for joint pain 180 capsule 3  . cyclobenzaprine (FLEXERIL) 10 MG tablet Take 1 tablet (10 mg total) by mouth 3 (three) times daily as needed for muscle spasms. 180 tablet 3  . diltiazem (CARDIZEM) 30 MG tablet Take 1 tablet (30 mg total) by mouth 4 (four) times daily as needed. 90 tablet 3  . fish oil-omega-3 fatty acids 1000 MG capsule Take 1 capsule by mouth daily.      Marland Kitchen lisinopril (PRINIVIL,ZESTRIL) 20 MG tablet Take 1 tablet (20 mg total) by mouth daily. 90 tablet 3  . metoprolol succinate (TOPROL-XL) 50 MG 24 hr tablet Take 1 tablet (50 mg total) by mouth daily. 90 tablet 3  . Multiple Vitamin (MULTIVITAMIN) tablet Take 1 tablet by mouth daily.      Marland Kitchen omeprazole (PRILOSEC) 20 MG capsule Take 1 capsule (20 mg total) by mouth daily. 90 capsule 3  . penciclovir (DENAVIR) 1 % cream Apply 1 application topically every 2 (two) hours. As needed for blister 1.5 g 3  . rosuvastatin  (CRESTOR) 10 MG tablet Take 1 tablet (10 mg total) by mouth daily. 90 tablet 3  . traMADol (ULTRAM) 50 MG tablet Take 1 tablet (50 mg total) by mouth every 6 (six) hours as needed. 120 tablet 5  . ondansetron (ZOFRAN) 4 MG tablet Take 1 tablet (4 mg total) by mouth every 8 (eight) hours as needed for nausea or vomiting. (Patient not taking: Reported on 03/08/2017) 30 tablet 0   No facility-administered medications prior to visit.     Review of Systems   Patient denies headache, fevers, malaise, unintentional weight loss, skin rash, eye pain, sinus congestion and sinus pain, sore throat, dysphagia,  hemoptysis , cough, dyspnea, wheezing, chest pain, palpitations, orthopnea, edema, abdominal pain, nausea, melena, diarrhea, constipation, flank pain, dysuria, hematuria, urinary  Frequency, nocturia, numbness, tingling, seizures,  Focal weakness, Loss of consciousness,  insomnia, depression, anxiety, and suicidal ideation.      Objective:  BP (!) 152/88 (BP Location: Left Arm, Patient Position: Sitting, Cuff Size: Normal)   Pulse 63  Temp 98.2 F (36.8 C) (Oral)   Resp 15   Ht  (1.676 m)   Wt 188 lb (85.3 kg)   SpO2 95%   BMI 30.34 kg/m   Physical Exam   General appearance: alert, cooperative and appears stated age Ears: normal TM's and external ear canals both ears Throat: lips, mucosa, and tongue normal; teeth and gums normal Neck: no adenopathy, no carotid bruit, supple, symmetrical, trachea midline and thyroid not enlarged, symmetric, no tenderness/mass/nodules Back: symmetric, no curvature. ROM normal. No CVA tenderness. Lungs: clear to auscultation bilaterally Heart: regular rate and rhythm, S1, S2 normal, no murmur, click, rub or gallop Abdomen: soft, non-tender; bowel sounds normal; no masses,  no organomegaly MSK: anterior right knee pain to palpation, Pulses: 2+ and symmetric Skin: Skin color, texture, turgor normal. No rashes or lesions Lymph nodes: Cervical,  supraclavicular, and axillary nodes normal. Neuro: CNs 2-12 intact. DTRs 2+/4 in biceps, brachioradialis, patellars and achilles. Muscle strength 5/5 in upper and lower exremities. Fine intention tremor right hand ,  cerebellar function normal. Romberg negative.  No pronator drift.   Gait normal. Positive Tinel's sign left hand     Assessment & Plan:   Problem List Items Addressed This Visit    Elevated ALT measurement - Primary    Noted upon review of outside labs from [place] in September.   Suspect the prior elevation was due to muscle breakdown from previous day's workout. He did not work out yesterday . Repeat today along with CK ,  All  Normal.    Lab Results  Component Value Date   ALT 43 03/08/2017   AST 32 03/08/2017   ALKPHOS 50 03/08/2017   BILITOT 0.9 03/08/2017   Lab Results  Component Value Date   CKTOTAL 96 03/08/2017        Relevant Orders   Hepatic function panel (Completed)   CK (Creatine Kinase) (Completed)   Encounter for preventive health examination    Annual comprehensive preventive exam was done as well as an evaluation and management of acute and chronic conditions .  During the course of the visit the patient was educated and counseled  about appropriate screening and preventive services including :  diabetes screening, lipid analysis with projected  10 year  risk for CAD , nutrition counseling, prostate and colorectal cancer screening, and recommended immunizations.  Printed recommendations for health maintenance screenings was given.   Lab Results  Component Value Date   PSA 0.6 02/04/2017   PSA 0.60 05/11/2016   PSA 0.64 10/17/2015         Family history of colon cancer requiring screening colonoscopy    Last done in 2016  ,  Continue 5 yr follow up for family history of colon CA in brother and grandmother.       Hearing loss due to cerumen impaction    Needs irrigation.  Has been using debrox  For weeks with no improvement right ear is fine        HYPERTENSION, BENIGN    Well controlled on current regimen. Renal function stable, no changes today.  Lab Results  Component Value Date   CREATININE 0.9 02/04/2017   Lab Results  Component Value Date   NA 138 02/04/2017   K 4.1 02/04/2017   CL 96 10/17/2015   CO2 28 10/17/2015         Intention tremor    Right hand,  New onset.  Occurs only with eating.  [facility]       Prediabetes    Managed with low GI diet.  A1c  Has increased from 5.7 to 6.0   Lab Results  Component Value Date   HGBA1C 6.0 02/04/2017         Runner's knee, right    Continue celebrex and tylenol  Advised to consider sporsts medicine referral for fitting of patellar brace and avod stair climibing      Tingling of left upper extremity    CTS suspected.  Neurologic workup in progress at [facility]        Other Visit Diagnoses    Hyperlipidemia LDL goal <130       Relevant Orders   LDL cholesterol, direct (Completed)      I am having Mr. Mccleery maintain his fish oil-omega-3 fatty acids, multivitamin, penciclovir, omeprazole, diltiazem, ondansetron, amLODipine, lisinopril, traMADol, cyclobenzaprine, celecoxib, metoprolol succinate, and rosuvastatin.  No orders of the defined types were placed in this encounter.   There are no discontinued medications.  Follow-up: No Follow-up on file.   Sherlene Shams, MD

## 2017-03-08 NOTE — Patient Instructions (Addendum)
IF you want to see sports medicine about the knee pain,  Mark Farley is excellent   Try adding ice to your regimen   Patellofemoral Pain Syndrome Patellofemoral pain syndrome is a condition that involves a softening or breakdown of the tissue (cartilage) on the underside of your kneecap (patella). This causes pain in the front of the knee. The condition is also called runner's knee or chondromalacia patella. Patellofemoral pain syndrome is most common in young adults who are active in sports. Your knee is the largest joint in your body. The patella covers the front of your knee and is attached to muscles above and below your knee. The underside of the patella is covered with a smooth type of cartilage (synovium). The smooth surface helps the patella glide easily when you move your knee. Patellofemoral pain syndrome causes swelling in the joint linings and bone surfaces in your knee. What are the causes? Patellofemoral pain syndrome can be caused by:  Overuse.  Poor alignment of your knee joints.  Weak leg muscles.  A direct blow to your kneecap.  What increases the risk? You may be at risk for patellofemoral pain syndrome if you:  Do a lot of activities that can wear down your kneecap. These include: ? Running. ? Squatting. ? Climbing stairs.  Start a new physical activity or exercise program.  Wear shoes that do not fit well.  Do not have good leg strength.  Are overweight.  What are the signs or symptoms? Knee pain is the most common symptom of patellofemoral pain syndrome. This may feel like a dull, aching pain underneath your patella, in the front of your knee. There may be a popping or cracking sound when you move your knee. Pain may get worse with:  Exercise.  Climbing stairs.  Running.  Jumping.  Squatting.  Kneeling.  Sitting for a long time.  Moving or pushing on your patella.  How is this diagnosed? Your health care provider may be able to diagnose  patellofemoral pain syndrome from your symptoms and medical history. You may be asked about your recent physical activities and which ones cause knee pain. Your health care provider may do a physical exam with certain tests to confirm the diagnosis. These may include:  Moving your patella back and forth.  Checking your range of knee motion.  Having you squat or jump to see if you have pain.  Checking the strength of your leg muscles.  An MRI of the knee may also be done. How is this treated? Patellofemoral pain syndrome can usually be treated at home with rest, ice, compression, and elevation (RICE). Other treatments may include:  Nonsteroidal anti-inflammatory drugs (NSAIDs).  Physical therapy to stretch and strengthen your leg muscles.  Shoe inserts (orthotics) to take stress off your knee.  A knee brace or knee support.  Surgery to remove damaged cartilage or move the patella to a better position. The need for surgery is rare.  Follow these instructions at home:  Take medicines only as directed by your health care provider.  Rest your knee. ? When resting, keep your knee raised above the level of your heart. ? Avoid activities that cause knee pain.  Apply ice to the injured area: ? Put ice in a plastic bag. ? Place a towel between your skin and the bag. ? Leave the ice on for 20 minutes, 2-3 times a day.  Use splints, braces, knee supports, or walking aids as directed by your health care provider.  Perform stretching and strengthening exercises as directed by your health care provider or physical therapist.  Keep all follow-up visits as directed by your health care provider. This is important. Contact a health care provider if:  Your symptoms get worse.  You are not improving with home care. This information is not intended to replace advice given to you by your health care provider. Make sure you discuss any questions you have with your health care  provider. Document Released: 05/06/2009 Document Revised: 10/24/2015 Document Reviewed: 08/07/2013 Elsevier Interactive Patient Education  2018 [employer].

## 2017-03-08 NOTE — Progress Notes (Addendum)
Ear lavage completed on left ear wax removed successfully without complication.  Patient tolerated procedure well without dizziness.     I have reviewed the above information and agree with above.   Duncan Dull, MD

## 2017-03-08 NOTE — Assessment & Plan Note (Signed)
Needs irrigation.  Has been using debrox  For weeks with no improvement right ear is fine

## 2017-03-09 ENCOUNTER — Encounter: Payer: Self-pay | Admitting: Internal Medicine

## 2017-03-09 DIAGNOSIS — K760 Fatty (change of) liver, not elsewhere classified: Secondary | ICD-10-CM | POA: Insufficient documentation

## 2017-03-09 DIAGNOSIS — G252 Other specified forms of tremor: Secondary | ICD-10-CM | POA: Insufficient documentation

## 2017-03-09 DIAGNOSIS — M2241 Chondromalacia patellae, right knee: Secondary | ICD-10-CM | POA: Insufficient documentation

## 2017-03-09 DIAGNOSIS — R202 Paresthesia of skin: Secondary | ICD-10-CM | POA: Insufficient documentation

## 2017-03-09 NOTE — Assessment & Plan Note (Signed)
Last done in 2016  ,  Continue 5 yr follow up for family history of colon CA in brother and grandmother.

## 2017-03-09 NOTE — Assessment & Plan Note (Signed)
Managed with low GI diet.  A1c  Has increased from 5.7 to 6.0   Lab Results  Component Value Date   HGBA1C 6.0 02/04/2017

## 2017-03-09 NOTE — Assessment & Plan Note (Signed)
Right hand,  New onset.  Occurs only with eating.  Seeing neurologist at [facility] this month

## 2017-03-09 NOTE — Assessment & Plan Note (Signed)
Annual comprehensive preventive exam was done as well as an evaluation and management of acute and chronic conditions .  During the course of the visit the patient was educated and counseled  about appropriate screening and preventive services including :  diabetes screening, lipid analysis with projected  10 year  risk for CAD , nutrition counseling, prostate and colorectal cancer screening, and recommended immunizations.  Printed recommendations for health maintenance screenings was given.   Lab Results  Component Value Date   PSA 0.6 02/04/2017   PSA 0.60 05/11/2016   PSA 0.64 10/17/2015

## 2017-03-09 NOTE — Assessment & Plan Note (Signed)
Continue celebrex and tylenol  Advised to consider sporsts medicine referral for fitting of patellar brace and avod stair climibing

## 2017-03-09 NOTE — Assessment & Plan Note (Signed)
Well controlled on current regimen. Renal function stable, no changes today.  Lab Results  Component Value Date   CREATININE 0.9 02/04/2017   Lab Results  Component Value Date   NA 138 02/04/2017   K 4.1 02/04/2017   CL 96 10/17/2015   CO2 28 10/17/2015

## 2017-03-09 NOTE — Assessment & Plan Note (Signed)
CTS suspected.  Neurologic workup in progress at [facility]

## 2017-03-09 NOTE — Assessment & Plan Note (Signed)
Noted upon review of outside labs from [place] in September.   Suspect the prior elevation was due to muscle breakdown from previous day's workout. He did not work out yesterday . Repeat today along with CK ,  All  Normal.    Lab Results  Component Value Date   ALT 43 03/08/2017   AST 32 03/08/2017   ALKPHOS 50 03/08/2017   BILITOT 0.9 03/08/2017   Lab Results  Component Value Date   CKTOTAL 96 03/08/2017

## 2017-03-11 ENCOUNTER — Telehealth: Payer: Self-pay | Admitting: Internal Medicine

## 2017-03-11 NOTE — Telephone Encounter (Signed)
Spoke with pt and he already had an appt scheduled for lab work on 03/07/2018. Labs have been ordered.

## 2017-03-11 NOTE — Telephone Encounter (Signed)
Pt called back returning your call. Please advise, thank you!  Call pt @ [phone]

## 2017-03-11 NOTE — Telephone Encounter (Signed)
[email or web address]

## 2017-03-11 NOTE — Telephone Encounter (Signed)
Duplicate message. 

## 2017-08-16 NOTE — Progress Notes (Signed)
Cardiology Office Note  Date:  08/18/2017   ID:  Mark StadeAnthony J Corky Singbbruzzi Jr., DOB 12/27/1945, MRN [id number]  PCP:  Sherlene Shamsullo, Teresa L, MD   Chief Complaint  Patient presents with  . Other    12 month follow up. Meds reviewed by the pt. verbally. "doing well."     HPI:  [age] y/o  male with h/o  HTN,  HL   previous syncope with negative Myoview in 2005.  h/o palpitations and had a Life Watch monitor for several weeks but no events. Monitor only caught a single PVC.   arthritis,  anemia from iron deficiency Previous Holter monitor showing runs of SVT, rate up to 150 bpm He presents today for follow-up of his palpitations, hypertension  Rare leg cramps Tolerating his statin He does have regular exercise program Weight trending up over the past year Recently got back from a cruise had no problems Denies significant tachycardia or chest pain Comfortable with his medications  Sometimes blood pressure runs a little bit high but most of the time is fine Has not required diltiazem 30 mg pills for tachycardia  EKG personally reviewed by myself on todays visit Shows normal sinus rhythm rate 59 bpm no significant ST or T wave changes  Other past medical history reviewed  severe series of tachycardia episodes day after Thanksgiving 2016 Reported he was driving in a car back from a trip, tachycardia seem to happen every 5 minutes. He took several diltiazem, possibly up to 3 or 4 and eventually symptoms seem to improve.  HGA1c 5.4, total cholesterol 151, LDL 70  EKG on today's visit shows normal sinus rhythm with rate 62 bpm, no significant ST or T-wave changes  Other past medical history Holter monitor showing frequent episodes of SVT, short-lived total cholesterol less than 150   PMH:   has a past medical history of Arthritis, Bilateral carpal tunnel syndrome, COPD (chronic obstructive pulmonary disease) (HCC), Glucose intolerance (impaired glucose tolerance), History of syncope  (2005), Hyperlipidemia, Hypertension, Iron deficiency anemia, Osteoarthritis of multiple joints, and Palpitations (2010).  PSH:    Past Surgical History:  Procedure Laterality Date  . COLONOSCOPY      Current Outpatient Medications  Medication Sig Dispense Refill  . amLODipine (NORVASC) 10 MG tablet Take 1 tablet (10 mg total) by mouth daily. (Patient taking differently: Take 5 mg by mouth daily. ) 90 tablet 3  . celecoxib (CELEBREX) 200 MG capsule Take 1 capsule (200 mg total) by mouth 2 (two) times daily. As needed for joint pain 180 capsule 3  . cyclobenzaprine (FLEXERIL) 10 MG tablet Take 1 tablet (10 mg total) by mouth 3 (three) times daily as needed for muscle spasms. 180 tablet 3  . diltiazem (CARDIZEM) 30 MG tablet Take 1 tablet (30 mg total) by mouth 4 (four) times daily as needed. 90 tablet 3  . fish oil-omega-3 fatty acids 1000 MG capsule Take 1 capsule by mouth daily.      Marland Kitchen. lisinopril (PRINIVIL,ZESTRIL) 20 MG tablet Take 1 tablet (20 mg total) by mouth daily. 90 tablet 3  . metoprolol succinate (TOPROL-XL) 50 MG 24 hr tablet Take 1 tablet (50 mg total) by mouth daily. 90 tablet 3  . Multiple Vitamin (MULTIVITAMIN) tablet Take 1 tablet by mouth daily.      Marland Kitchen. omeprazole (PRILOSEC) 20 MG capsule Take 1 capsule (20 mg total) by mouth daily. 90 capsule 3  . penciclovir (DENAVIR) 1 % cream Apply 1 application topically every 2 (two) hours. As  needed for blister 1.5 g 3  . rosuvastatin (CRESTOR) 10 MG tablet Take 1 tablet (10 mg total) by mouth daily. 90 tablet 3  . traMADol (ULTRAM) 50 MG tablet Take 1 tablet (50 mg total) by mouth every 6 (six) hours as needed. 120 tablet 5   No current facility-administered medications for this visit.      Allergies:   Patient has no known allergies.   Social History:  The patient  reports that  has never smoked. he has never used smokeless tobacco. He reports that he drinks about 7.0 oz of alcohol per week. He reports that he does not use  drugs.   Family History:   family history includes Colon cancer in his brother and paternal grandmother; Coronary artery disease in his mother; Heart attack in his mother; Heart failure in his father.    Review of Systems: Review of Systems  Constitutional: Negative.   Respiratory: Negative.   Cardiovascular: Negative.   Gastrointestinal: Negative.   Musculoskeletal: Negative.   Neurological: Negative.   Psychiatric/Behavioral: Negative.   All other systems reviewed and are negative.    PHYSICAL EXAM: VS:  BP 130/80 (BP Location: Left Arm, Patient Position: Sitting, Cuff Size: Normal)   Pulse (!) 58   Ht 5' 6.5" (1.689 m)   Wt 195 lb 8 oz (88.7 kg)   BMI 31.08 kg/m  , BMI Body mass index is 31.08 kg/m. Constitutional:  oriented to person, place, and time. No distress.  HENT:  Head: Normocephalic and atraumatic.  Eyes:  no discharge. No scleral icterus.  Neck: Normal range of motion. Neck supple. No JVD present.  Cardiovascular: Normal rate, regular rhythm, normal heart sounds and intact distal pulses. Exam reveals no gallop and no friction rub. No edema No murmur heard. Pulmonary/Chest: Effort normal and breath sounds normal. No stridor. No respiratory distress.  no wheezes.  no rales.  no tenderness.  Abdominal: Soft.  no distension.  no tenderness.  Musculoskeletal: Normal range of motion.  no  tenderness or deformity.  Neurological:  normal muscle tone. Coordination normal. No atrophy Skin: Skin is warm and dry. No rash noted. not diaphoretic.  Psychiatric:  normal mood and affect. behavior is normal. Thought content normal.      Recent Labs: 02/04/2017: BUN 16; Creatinine 0.9; Hemoglobin 14.4; Platelets 178; Potassium 4.1; Sodium 138; TSH 0.82 03/08/2017: ALT 43    Lipid Panel Lab Results  Component Value Date   CHOL 140 02/04/2017   HDL 50 02/04/2017   LDLCALC 70 05/11/2016   TRIG 201 (A) 02/04/2017      Wt Readings from Last 3 Encounters:  08/18/17 195 lb  8 oz (88.7 kg)  03/08/17 188 lb (85.3 kg)  03/05/17 187 lb 6.4 oz (85 kg)       ASSESSMENT AND PLAN:  Mixed hyperlipidemia StableCholesterol is at goal on the current lipid regimen. No changes to the medications were made.  HYPERTENSION, BENIGN Blood pressure is well controlled on today's visit. No changes made to the medications.   PVC's (premature ventricular contractions) Rare ectopy, no further workup at this time  SVT (supraventricular tachycardia) (HCC) Denies having significant runs of tachycardia concerning for SVT He still has diltiazem that he can take as needed No further workup, stable  History of syncope Denies having any near syncope or syncope Stable  Encounter for preventive health examination On previous visit discussed screening strategies for him Discussed CT coronary calcium scoring.     Total encounter time more than  15 minutes  Greater than 50% was spent in counseling and coordination of care with the patient   Disposition:   F/U  12 months   Orders Placed This Encounter  Procedures  . EKG 12-Lead     Signed, Dossie Arbour, M.D., Ph.D. 08/18/2017  [facility] [place], [place] [phone]

## 2017-08-18 ENCOUNTER — Encounter: Payer: Self-pay | Admitting: Cardiovascular Disease

## 2017-08-18 ENCOUNTER — Ambulatory Visit (INDEPENDENT_AMBULATORY_CARE_PROVIDER_SITE_OTHER): Payer: Medicare Other | Admitting: Cardiovascular Disease

## 2017-08-18 VITALS — BP 130/80 | HR 58 | Ht 66.5 in | Wt 195.5 lb

## 2017-08-18 DIAGNOSIS — Z87898 Personal history of other specified conditions: Secondary | ICD-10-CM | POA: Diagnosis not present

## 2017-08-18 DIAGNOSIS — I471 Supraventricular tachycardia: Secondary | ICD-10-CM | POA: Diagnosis not present

## 2017-08-18 DIAGNOSIS — I10 Essential (primary) hypertension: Secondary | ICD-10-CM | POA: Diagnosis not present

## 2017-08-18 DIAGNOSIS — I493 Ventricular premature depolarization: Secondary | ICD-10-CM | POA: Diagnosis not present

## 2017-08-18 DIAGNOSIS — E782 Mixed hyperlipidemia: Secondary | ICD-10-CM | POA: Diagnosis not present

## 2017-08-18 NOTE — Patient Instructions (Addendum)

## 2017-09-16 DIAGNOSIS — S83207A Unspecified tear of unspecified meniscus, current injury, left knee, initial encounter: Secondary | ICD-10-CM | POA: Insufficient documentation

## 2017-10-15 ENCOUNTER — Telehealth: Payer: Self-pay | Admitting: Internal Medicine

## 2017-10-24 ENCOUNTER — Encounter: Payer: Self-pay | Admitting: Internal Medicine

## 2017-10-26 MED ORDER — CELECOXIB 200 MG PO CAPS: 200.0000 mg | ORAL_CAPSULE | Freq: Two times a day (BID) | ORAL | 3 refills | Status: DC

## 2017-10-26 NOTE — Telephone Encounter (Signed)
please  print rx for celebrex

## 2017-10-28 ENCOUNTER — Ambulatory Visit (INDEPENDENT_AMBULATORY_CARE_PROVIDER_SITE_OTHER): Payer: Medicare Other | Admitting: Orthopedic Surgery

## 2017-10-28 ENCOUNTER — Encounter (INDEPENDENT_AMBULATORY_CARE_PROVIDER_SITE_OTHER): Payer: Self-pay | Admitting: Orthopedic Surgery

## 2017-10-28 DIAGNOSIS — S838X2A Sprain of other specified parts of left knee, initial encounter: Secondary | ICD-10-CM

## 2017-10-28 DIAGNOSIS — G8929 Other chronic pain: Secondary | ICD-10-CM | POA: Diagnosis not present

## 2017-10-28 DIAGNOSIS — M25562 Pain in left knee: Secondary | ICD-10-CM | POA: Diagnosis not present

## 2017-10-30 ENCOUNTER — Encounter (INDEPENDENT_AMBULATORY_CARE_PROVIDER_SITE_OTHER): Payer: Self-pay | Admitting: Orthopedic Surgery

## 2017-10-30 NOTE — Progress Notes (Signed)
Office Visit Note   Patient: Mark Farley.           Date of Birth: 05-30-1946           MRN: [id number] Visit Date: 10/28/2017 Requested by: Sherlene Shams, MD [address] [place], [place] [address] PCP: Sherlene Shams, MD  Subjective: Chief Complaint  Patient presents with  . Left Knee - Pain    HPI: Mark Farley is an active and young appearing 72 year old patient with several month history of left knee pain.  Previous radiographs done at the [facility].  Those reports are reviewed today and really describe no arthritis in the knee.  He describes medial sided pain in the left knee with swelling weakness giving way and pain that wakes him from sleep at night.  He also describes a locking sensation.  He is tried taking Celebrex and tramadol without relief.  He really cannot go to the gym and work out anymore.  He likes to do gardening but has difficulty squatting.              ROS: All systems reviewed are negative as they relate to the chief complaint within the history of present illness.  Patient denies  fevers or chills.   Assessment & Plan: Visit Diagnoses:  1. Injury of meniscus of left knee, initial encounter   2. Chronic pain of left knee     Plan: Impression is swelling pain and no significant arthritis on plain radiographs of the left knee.  Mechanical symptoms present.  Statistically this represents medial meniscal tear.  He would consider surgical intervention due to the failure of conservative management and lack of improvement over several months time.  He has been taking Celebrex and tramadol.  Need MRI scan of the left knee to evaluate for medial meniscal tear.  Options at that time would be injection or surgical debridement.  I will see him after the scan  Follow-Up Instructions: Return for after MRI.   Orders:  Orders Placed This Encounter  Procedures  . MR Knee Left w/o contrast   No orders of the defined types were placed in this  encounter.     Procedures: No procedures performed   Clinical Data: No additional findings.  Objective: Vital Signs: There were no vitals taken for this visit.  Physical Exam:   Constitutional: Patient appears well-developed HEENT:  Head: Normocephalic Eyes:EOM are normal Neck: Normal range of motion Cardiovascular: Normal rate Pulmonary/chest: Effort normal Neurologic: Patient is alert Skin: Skin is warm Psychiatric: Patient has normal mood and affect    Ortho Exam: Orthopedic exam demonstrates slightly antalgic gait to the left with mild effusion full active and passive range of motion with no flexion contracture on the left-hand side.  Medial joint line tenderness is present.  Collateral and cruciate ligaments are stable.  No other masses lymphadenopathy or skin changes noted in the left knee region.  Specialty Comments:  No specialty comments available.  Imaging: No results found.   PMFS History: Patient Active Problem List   Diagnosis Date Noted  . Intention tremor 03/09/2017  . Tingling of left upper extremity 03/09/2017  . Runner's knee, right 03/09/2017  . Elevated ALT measurement 03/09/2017  . Hearing loss due to cerumen impaction 03/08/2017  . Encounter for preventive health examination 05/26/2015  . SVT (supraventricular tachycardia) (HCC) 02/07/2015  . History of syncope 03/06/2014  . PVC's (premature ventricular contractions) 03/06/2014  . Osteoarthritis 02/08/2012  . Prediabetes 08/06/2011  .  Overweight (BMI 25.0-29.9) 02/06/2011  . Family history of colon cancer requiring screening colonoscopy 02/05/2011  . Hyperlipidemia 08/14/2008  . HYPERTENSION, BENIGN 08/14/2008   Past Medical History:  Diagnosis Date  . Arthritis    ? Gout  . Bilateral carpal tunnel syndrome    managed with nocturnal braces x 3 years  . COPD (chronic obstructive pulmonary disease) (HCC)   . Glucose intolerance (impaired glucose tolerance)   . History of syncope  2005   Unexplained, myoview negative  . Hyperlipidemia   . Hypertension   . Iron deficiency anemia   . Osteoarthritis of multiple joints   . Palpitations 2010   Life Watch with single PVC    Family History  Problem Relation Age of Onset  . Coronary artery disease Mother   . Heart attack Mother        MI  . Heart failure Father        CHF  . Colon cancer Brother   . Colon cancer Paternal Grandmother     Past Surgical History:  Procedure Laterality Date  . COLONOSCOPY     Social History   Occupational History  . Not on file  Tobacco Use  . Smoking status: Never Smoker  . Smokeless tobacco: Never Used  Substance and Sexual Activity  . Alcohol use: Yes    Alcohol/week: 7.0 oz    Types: 14 Standard drinks or equivalent per week    Comment: 2 glasses of wine a day  . Drug use: No  . Sexual activity: Yes

## 2017-11-03 ENCOUNTER — Ambulatory Visit (INDEPENDENT_AMBULATORY_CARE_PROVIDER_SITE_OTHER): Payer: Medicare Other | Admitting: Orthopedic Surgery

## 2017-11-04 ENCOUNTER — Ambulatory Visit
Admission: RE | Admit: 2017-11-04 | Discharge: 2017-11-04 | Disposition: A | Discharge status: Home / Self Care | Payer: Medicare Other | Source: Ambulatory Visit | Attending: Orthopedic Surgery | Admitting: Orthopedic Surgery

## 2017-11-04 DIAGNOSIS — G8929 Other chronic pain: Secondary | ICD-10-CM

## 2017-11-04 DIAGNOSIS — M25562 Pain in left knee: Principal | ICD-10-CM

## 2017-11-10 ENCOUNTER — Encounter (INDEPENDENT_AMBULATORY_CARE_PROVIDER_SITE_OTHER): Payer: Self-pay | Admitting: Orthopedic Surgery

## 2017-11-10 ENCOUNTER — Ambulatory Visit (INDEPENDENT_AMBULATORY_CARE_PROVIDER_SITE_OTHER): Payer: Medicare Other | Admitting: Orthopedic Surgery

## 2017-11-10 DIAGNOSIS — S838X2D Sprain of other specified parts of left knee, subsequent encounter: Secondary | ICD-10-CM

## 2017-11-10 DIAGNOSIS — M1712 Unilateral primary osteoarthritis, left knee: Secondary | ICD-10-CM

## 2017-11-10 MED ORDER — METHYLPREDNISOLONE ACETATE 40 MG/ML IJ SUSP
40.0000 mg | INTRAMUSCULAR | Status: AC | PRN
  Administered 2017-11-10: 40 mg via INTRA_ARTICULAR

## 2017-11-10 MED ORDER — LIDOCAINE HCL 1 % IJ SOLN
5.0000 mL | INTRAMUSCULAR | Status: AC | PRN
  Administered 2017-11-10: 5 mL

## 2017-11-10 MED ORDER — BUPIVACAINE HCL 0.25 % IJ SOLN
4.0000 mL | INTRAMUSCULAR | Status: AC | PRN
  Administered 2017-11-10: 4 mL via INTRA_ARTICULAR

## 2017-11-10 NOTE — Progress Notes (Signed)
Office Visit Note   Patient: Mark Pillownthony J Nikkel Jr.           Date of Birth: 06/01/1945           MRN: [id number] Visit Date: 11/10/2017 Requested by: Sherlene Shamsullo, Teresa L, MD [address] [place], [place] [id number] PCP: Sherlene Shamsullo, Teresa L, MD  Subjective: Chief Complaint  Patient presents with  . Left Knee - Follow-up    HPI:  Mark Farley is a patient with left knee pain.  Since I have seen him he has had an MRI scan which is reviewed with him today.  MRI scan shows medial meniscal tear along with arthritis in that medial compartment.  Lateral compartment looks pretty reasonable along with the patellofemoral joint.  He does take Celebrex on a daily basis.  Prior to March she was doing the stair climber and elliptical on a regular basis.              ROS: All systems reviewed are negative as they relate to the chief complaint within the history of present illness.  Patient denies  fevers or chills.   Assessment & Plan: Visit Diagnoses:  1. Unilateral primary osteoarthritis, left knee   2. Injury of meniscus of left knee, subsequent encounter     Plan: Impression is medial compartment arthritis and a meniscal tear in the left knee.  There is also some bone bruising consistent with increased stress on that left knee.  Plan is aspiration and injection of the left knee today with continuation of Celebrex and workout modification to include no loadbearing cardio activities.  I will see him back in 6 weeks and decision point at that time will be for or against arthroscopic partial meniscectomy.  Did discuss with him today how that would not really treat the arthritis but might help with some of his pain.  Follow-Up Instructions: Return in about 6 weeks (around 12/22/2017).   Orders:  No orders of the defined types were placed in this encounter.  No orders of the defined types were placed in this encounter.     Procedures: Large Joint Inj: L knee on 11/10/2017 10:19 AM Indications:  diagnostic evaluation, joint swelling and pain Details: 18 G 1.5 in needle, superolateral approach  Arthrogram: No  Medications: 5 mL lidocaine 1 %; 40 mg methylPREDNISolone acetate 40 MG/ML; 4 mL bupivacaine 0.25 % Aspirate: 35 mL yellow Outcome: tolerated well, no immediate complications Procedure, treatment alternatives, risks and benefits explained, specific risks discussed. Consent was given by the patient. Immediately prior to procedure a time out was called to verify the correct patient, procedure, equipment, support staff and site/side marked as required. Patient was prepped and draped in the usual sterile fashion.       Clinical Data: No additional findings.  Objective: Vital Signs: There were no vitals taken for this visit.  Physical Exam:   Constitutional: Patient appears well-developed HEENT:  Head: Normocephalic Eyes:EOM are normal Neck: Normal range of motion Cardiovascular: Normal rate Pulmonary/chest: Effort normal Neurologic: Patient is alert Skin: Skin is warm Psychiatric: Patient has normal mood and affect    Ortho Exam: Ortho exam demonstrates normal gait alignment with mild effusion in the left knee.  Collateral cruciate ligaments are stable.  There is medial joint line tenderness.  No quad tendon tenderness.  Range of motion is full with no flexion contracture in that left knee.  No groin pain with internal/external rotation of the leg.  Pedal pulses palpable.  Specialty Comments:  No specialty comments available.  Imaging: No results found.   PMFS History: Patient Active Problem List   Diagnosis Date Noted  . Intention tremor 03/09/2017  . Tingling of left upper extremity 03/09/2017  . Runner's knee, right 03/09/2017  . Elevated ALT measurement 03/09/2017  . Hearing loss due to cerumen impaction 03/08/2017  . Encounter for preventive health examination 05/26/2015  . SVT (supraventricular tachycardia) (HCC) 02/07/2015  . History of syncope  03/06/2014  . PVC's (premature ventricular contractions) 03/06/2014  . Osteoarthritis 02/08/2012  . Prediabetes 08/06/2011  . Overweight (BMI 25.0-29.9) 02/06/2011  . Family history of colon cancer requiring screening colonoscopy 02/05/2011  . Hyperlipidemia 08/14/2008  . HYPERTENSION, BENIGN 08/14/2008   Past Medical History:  Diagnosis Date  . Arthritis    ? Gout  . Bilateral carpal tunnel syndrome    managed with nocturnal braces x 3 years  . COPD (chronic obstructive pulmonary disease) (HCC)   . Glucose intolerance (impaired glucose tolerance)   . History of syncope 2005   Unexplained, myoview negative  . Hyperlipidemia   . Hypertension   . Iron deficiency anemia   . Osteoarthritis of multiple joints   . Palpitations 2010   Life Watch with single PVC    Family History  Problem Relation Age of Onset  . Coronary artery disease Mother   . Heart attack Mother        MI  . Heart failure Father        CHF  . Colon cancer Brother   . Colon cancer Paternal Grandmother     Past Surgical History:  Procedure Laterality Date  . COLONOSCOPY     Social History   Occupational History  . Not on file  Tobacco Use  . Smoking status: Never Smoker  . Smokeless tobacco: Never Used  Substance and Sexual Activity  . Alcohol use: Yes    Alcohol/week: 7.0 oz    Types: 14 Standard drinks or equivalent per week    Comment: 2 glasses of wine a day  . Drug use: No  . Sexual activity: Yes

## 2017-12-24 ENCOUNTER — Encounter (INDEPENDENT_AMBULATORY_CARE_PROVIDER_SITE_OTHER): Payer: Self-pay | Admitting: Orthopedic Surgery

## 2017-12-24 ENCOUNTER — Ambulatory Visit (INDEPENDENT_AMBULATORY_CARE_PROVIDER_SITE_OTHER): Payer: Medicare Other | Admitting: Orthopedic Surgery

## 2017-12-24 DIAGNOSIS — S838X2A Sprain of other specified parts of left knee, initial encounter: Secondary | ICD-10-CM | POA: Diagnosis not present

## 2017-12-24 NOTE — Progress Notes (Signed)
Office Visit Note   Patient: Mark Farley.           Date of Birth: January 03, 1946           MRN: [id number] Visit Date: 12/24/2017 Requested by: Mark Shams, MD [address] [place], [place] [address] PCP: Mark Shams, MD  Subjective: Chief Complaint  Patient presents with  . Left Knee - Follow-up    HPI: Mark Farley is a patient with left knee pain.  Since I have seen him he had an injection which was done 11/10/2017.  He is not really any better.  He still is in a lot of pain.  He likes to work out.  He has medial meniscal tear and arthritis and possible early insufficiency fracture in that left knee medial compartment.  He does report mechanical symptoms.  He does not want to do total knee replacement.  His knee was good for about 2 to 3 days after the injection.  He has tried a brace without relief.              ROS: All systems reviewed are negative as they relate to the chief complaint within the history of present illness.  Patient denies  fevers or chills.   Assessment & Plan: Visit Diagnoses:  1. Injury of meniscus of left knee, initial encounter     Plan: Impression is left knee pain with focal medial meniscal tear and medial sided pathology.  Not too much in the way of arthritis on plain radiographs.  It is a little bit more impressive on the MRI scan.  Discussed with Mark Farley the risk and benefits of arthroscopic intervention for meniscal debridement.  Primary risk would be that he is no better and even potentially worse.  Possible he may need partial versus total knee replacement within 3 months of that surgery.  Alternatively if the meniscus is the main pain generator then debridement could afford him some relief to the point where he could delay his need for partial or total knee replacement in the future.  He would like to go ahead with arthroscopic intervention which I think based on his current unlivable situation is the best option.  I will see him  back 7 days after surgery.  All questions answered  Follow-Up Instructions: No follow-ups on file.   Orders:  No orders of the defined types were placed in this encounter.  No orders of the defined types were placed in this encounter.     Procedures: No procedures performed   Clinical Data: No additional findings.  Objective: Vital Signs: There were no vitals taken for this visit.  Physical Exam:   Constitutional: Patient appears well-developed HEENT:  Head: Normocephalic Eyes:EOM are normal Neck: Normal range of motion Cardiovascular: Normal rate Pulmonary/chest: Effort normal Neurologic: Patient is alert Skin: Skin is warm Psychiatric: Patient has normal mood and affect    Ortho Exam: Ortho exam to be straits full active and passive range of motion of that left knee with mild effusion and medial joint line tenderness.  Collateral cruciate ligaments are intact.  There is no varus deformity.  Pedal pulses palpable.  Specialty Comments:  No specialty comments available.  Imaging: No results found.   PMFS History: Patient Active Problem List   Diagnosis Date Noted  . Intention tremor 03/09/2017  . Tingling of left upper extremity 03/09/2017  . Runner's knee, right 03/09/2017  . Elevated ALT measurement 03/09/2017  . Hearing loss due to cerumen  impaction 03/08/2017  . Encounter for preventive health examination 05/26/2015  . SVT (supraventricular tachycardia) (HCC) 02/07/2015  . History of syncope 03/06/2014  . PVC's (premature ventricular contractions) 03/06/2014  . Osteoarthritis 02/08/2012  . Prediabetes 08/06/2011  . Overweight (BMI 25.0-29.9) 02/06/2011  . Family history of colon cancer requiring screening colonoscopy 02/05/2011  . Hyperlipidemia 08/14/2008  . HYPERTENSION, BENIGN 08/14/2008   Past Medical History:  Diagnosis Date  . Arthritis    ? Gout  . Bilateral carpal tunnel syndrome    managed with nocturnal braces x 3 years  . COPD  (chronic obstructive pulmonary disease) (HCC)   . Glucose intolerance (impaired glucose tolerance)   . History of syncope 2005   Unexplained, myoview negative  . Hyperlipidemia   . Hypertension   . Iron deficiency anemia   . Osteoarthritis of multiple joints   . Palpitations 2010   Life Watch with single PVC    Family History  Problem Relation Age of Onset  . Coronary artery disease Mother   . Heart attack Mother        MI  . Heart failure Father        CHF  . Colon cancer Brother   . Colon cancer Paternal Grandmother     Past Surgical History:  Procedure Laterality Date  . COLONOSCOPY     Social History   Occupational History  . Not on file  Tobacco Use  . Smoking status: Never Smoker  . Smokeless tobacco: Never Used  Substance and Sexual Activity  . Alcohol use: Yes    Alcohol/week: 8.4 oz    Types: 14 Standard drinks or equivalent per week    Comment: 2 glasses of wine a day  . Drug use: No  . Sexual activity: Yes

## 2018-02-07 DIAGNOSIS — M23222 Derangement of posterior horn of medial meniscus due to old tear or injury, left knee: Secondary | ICD-10-CM

## 2018-02-09 ENCOUNTER — Telehealth (INDEPENDENT_AMBULATORY_CARE_PROVIDER_SITE_OTHER): Payer: Self-pay | Admitting: Orthopedic Surgery

## 2018-02-09 NOTE — Telephone Encounter (Signed)
Does patient need to continue with ace wrap? Please advise. Thanks.

## 2018-02-09 NOTE — Telephone Encounter (Signed)
IC advised.  

## 2018-02-09 NOTE — Telephone Encounter (Signed)
Patients wife called to ask about keeping on the ACE wrap. She states patient took it off to shower and doesn't feel like he needs to have it on. Please advise [name] whether or not she should rewrap # [phone]

## 2018-02-09 NOTE — Telephone Encounter (Signed)
Dc ace

## 2018-02-14 ENCOUNTER — Ambulatory Visit (INDEPENDENT_AMBULATORY_CARE_PROVIDER_SITE_OTHER): Payer: Medicare Other | Admitting: Orthopedic Surgery

## 2018-02-14 ENCOUNTER — Encounter (INDEPENDENT_AMBULATORY_CARE_PROVIDER_SITE_OTHER): Payer: Self-pay | Admitting: Orthopedic Surgery

## 2018-02-14 DIAGNOSIS — S838X2A Sprain of other specified parts of left knee, initial encounter: Secondary | ICD-10-CM

## 2018-02-15 ENCOUNTER — Encounter (INDEPENDENT_AMBULATORY_CARE_PROVIDER_SITE_OTHER): Payer: Self-pay | Admitting: Orthopedic Surgery

## 2018-02-15 NOTE — Progress Notes (Signed)
   Post-Op Visit Note   Patient: Mark Farley.           Date of Birth: 04/08/1946           MRN: [id number] Visit Date: 02/14/2018 PCP: Sherlene Shamsullo, Teresa L, MD   Assessment & Plan:  Chief Complaint:  Chief Complaint  Patient presents with  . Left Knee - Routine Post Op   Visit Diagnoses:  1. Injury of meniscus of left knee, initial encounter     Plan: Patient presents a week out left knee arthroscopy and partial medial meniscectomy.  On exam he is got flexion to 90 and mild effusion.  Bends easily.  Gait only slightly antalgic.  His right knee is hurting him some and we may consider injection at the next clinic visit.  For now his left knee looks good.  Continue with non-loadbearing quad strengthening exercises and I will see him back in a month  Follow-Up Instructions: Return in about 4 weeks (around 03/14/2018).   Orders:  No orders of the defined types were placed in this encounter.  No orders of the defined types were placed in this encounter.   Imaging: No results found.  PMFS History: Patient Active Problem List   Diagnosis Date Noted  . Intention tremor 03/09/2017  . Tingling of left upper extremity 03/09/2017  . Runner's knee, right 03/09/2017  . Elevated ALT measurement 03/09/2017  . Hearing loss due to cerumen impaction 03/08/2017  . Encounter for preventive health examination 05/26/2015  . SVT (supraventricular tachycardia) (HCC) 02/07/2015  . History of syncope 03/06/2014  . PVC's (premature ventricular contractions) 03/06/2014  . Osteoarthritis 02/08/2012  . Prediabetes 08/06/2011  . Overweight (BMI 25.0-29.9) 02/06/2011  . Family history of colon cancer requiring screening colonoscopy 02/05/2011  . Hyperlipidemia 08/14/2008  . HYPERTENSION, BENIGN 08/14/2008   Past Medical History:  Diagnosis Date  . Arthritis    ? Gout  . Bilateral carpal tunnel syndrome    managed with nocturnal braces x 3 years  . COPD (chronic obstructive pulmonary  disease) (HCC)   . Glucose intolerance (impaired glucose tolerance)   . History of syncope 2005   Unexplained, myoview negative  . Hyperlipidemia   . Hypertension   . Iron deficiency anemia   . Osteoarthritis of multiple joints   . Palpitations 2010   Life Watch with single PVC    Family History  Problem Relation Age of Onset  . Coronary artery disease Mother   . Heart attack Mother        MI  . Heart failure Father        CHF  . Colon cancer Brother   . Colon cancer Paternal Grandmother     Past Surgical History:  Procedure Laterality Date  . COLONOSCOPY     Social History   Occupational History  . Not on file  Tobacco Use  . Smoking status: Never Smoker  . Smokeless tobacco: Never Used  Substance and Sexual Activity  . Alcohol use: Yes    Alcohol/week: 14.0 standard drinks    Types: 14 Standard drinks or equivalent per week    Comment: 2 glasses of wine a day  . Drug use: No  . Sexual activity: Yes

## 2018-03-01 ENCOUNTER — Encounter (INDEPENDENT_AMBULATORY_CARE_PROVIDER_SITE_OTHER): Payer: Self-pay | Admitting: Orthopedic Surgery

## 2018-03-07 ENCOUNTER — Ambulatory Visit: Payer: Medicare Other

## 2018-03-07 ENCOUNTER — Other Ambulatory Visit: Payer: Medicare Other

## 2018-03-09 ENCOUNTER — Encounter: Payer: Medicare Other | Admitting: Internal Medicine

## 2018-03-10 ENCOUNTER — Other Ambulatory Visit: Payer: Medicare Other

## 2018-03-10 ENCOUNTER — Ambulatory Visit (INDEPENDENT_AMBULATORY_CARE_PROVIDER_SITE_OTHER): Payer: Medicare Other

## 2018-03-10 VITALS — BP 130/80 | HR 74 | Temp 98.2°F | Resp 15 | Ht 67.0 in | Wt 196.4 lb

## 2018-03-10 DIAGNOSIS — R5383 Other fatigue: Secondary | ICD-10-CM

## 2018-03-10 DIAGNOSIS — R7303 Prediabetes: Secondary | ICD-10-CM

## 2018-03-10 DIAGNOSIS — E7800 Pure hypercholesterolemia, unspecified: Secondary | ICD-10-CM | POA: Diagnosis not present

## 2018-03-10 DIAGNOSIS — Z23 Encounter for immunization: Secondary | ICD-10-CM

## 2018-03-10 DIAGNOSIS — Z0000 Encounter for general adult medical examination without abnormal findings: Secondary | ICD-10-CM | POA: Diagnosis not present

## 2018-03-10 LAB — LIPID PANEL
CHOLESTEROL: 145 mg/dL (ref 0–200)
HDL: 49.7 mg/dL (ref >39.00)
LDL Cholesterol: 68 mg/dL (ref 0–99)
NonHDL: 95.37
TRIGLYCERIDES: 139 mg/dL (ref 0.0–149.0)
Total CHOL/HDL Ratio: 3
VLDL: 27.8 mg/dL (ref 0.0–40.0)

## 2018-03-10 LAB — COMPREHENSIVE METABOLIC PANEL
ALBUMIN: 4.5 g/dL (ref 3.5–5.2)
ALT: 98 U/L — ABNORMAL HIGH (ref 0–53)
AST: 76 U/L — ABNORMAL HIGH (ref 0–37)
Alkaline Phosphatase: 55 U/L (ref 39–117)
BUN: 14 mg/dL (ref 6–23)
CALCIUM: 9.6 mg/dL (ref 8.4–10.5)
CHLORIDE: 98 meq/L (ref 96–112)
CO2: 29 mEq/L (ref 19–32)
Creatinine, Ser: 0.77 mg/dL (ref 0.40–1.50)
GFR: 105.4 mL/min (ref >60.00)
Glucose, Bld: 136 mg/dL — ABNORMAL HIGH (ref 70–99)
POTASSIUM: 5 meq/L (ref 3.5–5.1)
SODIUM: 133 meq/L — AB (ref 135–145)
Total Bilirubin: 1.2 mg/dL (ref 0.2–1.2)
Total Protein: 7.1 g/dL (ref 6.0–8.3)

## 2018-03-10 LAB — CBC WITH DIFFERENTIAL/PLATELET
BASOS PCT: 0.5 % (ref 0.0–3.0)
Basophils Absolute: 0 10*3/uL (ref 0.0–0.1)
EOS PCT: 1.1 % (ref 0.0–5.0)
Eosinophils Absolute: 0.1 10*3/uL (ref 0.0–0.7)
HEMATOCRIT: 43.7 % (ref 39.0–52.0)
Hemoglobin: 14.8 g/dL (ref 13.0–17.0)
Lymphocytes Relative: 35.8 % (ref 12.0–46.0)
Lymphs Abs: 1.7 10*3/uL (ref 0.7–4.0)
MCHC: 34 g/dL (ref 30.0–36.0)
MCV: 92.3 fl (ref 78.0–100.0)
MONO ABS: 0.5 10*3/uL (ref 0.1–1.0)
Monocytes Relative: 10.3 % (ref 3.0–12.0)
Neutro Abs: 2.4 10*3/uL (ref 1.4–7.7)
Neutrophils Relative %: 52.3 % (ref 43.0–77.0)
Platelets: 169 10*3/uL (ref 150.0–400.0)
RBC: 4.73 Mil/uL (ref 4.22–5.81)
RDW: 12.9 % (ref 11.5–15.5)
WBC: 4.7 10*3/uL (ref 4.0–10.5)

## 2018-03-10 LAB — VITAMIN D 25 HYDROXY (VIT D DEFICIENCY, FRACTURES): VITD: 45.24 ng/mL (ref 30.00–100.00)

## 2018-03-10 LAB — HEMOGLOBIN A1C: Hgb A1c MFr Bld: 5.9 % (ref 4.6–6.5)

## 2018-03-10 LAB — TSH: TSH: 0.9 u[IU]/mL (ref 0.35–4.50)

## 2018-03-10 NOTE — Progress Notes (Addendum)
Subjective:   Mark Farley. is a 72 y.o. male who presents for Medicare Annual/Subsequent preventive examination.  Review of Systems:  No ROS.  Medicare Wellness Visit. Additional risk factors are reflected in the social history. Cardiac Risk Factors include: advanced age (>77men, >37 women);male gender;hypertension     Objective:    Vitals: BP 130/80 (BP Location: Left Arm, Patient Position: Sitting, Cuff Size: Normal)   Pulse 74   Temp 98.2 F (36.8 C) (Oral)   Resp 15   Ht 5\' 7"  (1.702 m)   Wt 196 lb 6.4 oz (89.1 kg)   SpO2 96%   BMI 30.76 kg/m   Body mass index is 30.76 kg/m.  Advanced Directives 03/10/2018 03/05/2017  Does Patient Have a Medical Advance Directive? No No  Would patient like information on creating a medical advance directive? No - Patient declined No - Patient declined    Tobacco Social History   Tobacco Use  Smoking Status Never Smoker  Smokeless Tobacco Never Used     Counseling given: Not Answered   Clinical Intake:  Pre-visit preparation completed: Yes  Pain Score: 3  Pain Type: Chronic pain Pain Location: Knee Pain Descriptors / Indicators: Aching Pain Onset: More than a month ago Pain Frequency: Intermittent Pain Relieving Factors: Rest. No longer taking Tramadol.   Pain Relieving Factors: Rest. No longer taking Tramadol.   Nutritional Status: BMI > 30  Obese Diabetes: No  How often do you need to have someone help you when you read instructions, pamphlets, or other written materials from your doctor or pharmacy?: 1 - Never  Interpreter Needed?: No     Past Medical History:  Diagnosis Date  . Arthritis    ? Gout  . Bilateral carpal tunnel syndrome    managed with nocturnal braces x 3 years  . COPD (chronic obstructive pulmonary disease) (HCC)   . Glucose intolerance (impaired glucose tolerance)   . History of syncope 2005   Unexplained, myoview negative  . Hyperlipidemia   . Hypertension   . Iron  deficiency anemia   . Osteoarthritis of multiple joints   . Palpitations 2010   Life Watch with single PVC   Past Surgical History:  Procedure Laterality Date  . COLONOSCOPY     Family History  Problem Relation Age of Onset  . Coronary artery disease Mother   . Heart attack Mother        MI  . Heart failure Father        CHF  . Colon cancer Brother   . Colon cancer Paternal Grandmother    Social History   Socioeconomic History  . Marital status: Married    Spouse name: Not on file  . Number of children: Not on file  . Years of education: Not on file  . Highest education level: Not on file  Occupational History  . Not on file  Social Needs  . Financial resource strain: Not hard at all  . Food insecurity:    Worry: Never true    Inability: Never true  . Transportation needs:    Medical: No    Non-medical: No  Tobacco Use  . Smoking status: Never Smoker  . Smokeless tobacco: Never Used  Substance and Sexual Activity  . Alcohol use: Yes    Alcohol/week: 14.0 standard drinks    Types: 14 Standard drinks or equivalent per week    Comment: 2 glasses of wine a day  . Drug use: No  . Sexual  activity: Yes  Lifestyle  . Physical activity:    Days per week: 5 days    Minutes per session: 120 min  . Stress: Only a little  Relationships  . Social connections:    Talks on phone: Not on file    Gets together: Not on file    Attends religious service: Not on file    Active member of club or organization: Not on file    Attends meetings of clubs or organizations: Not on file    Relationship status: Married  Other Topics Concern  . Not on file  Social History Narrative   Married   Very active    Outpatient Encounter Medications as of 03/10/2018  Medication Sig  . amLODipine (NORVASC) 10 MG tablet Take 1 tablet (10 mg total) by mouth daily. (Patient taking differently: Take 5 mg by mouth daily. )  . celecoxib (CELEBREX) 200 MG capsule Take 1 capsule (200 mg total) by  mouth 2 (two) times daily. As needed for joint pain  . diltiazem (CARDIZEM) 30 MG tablet Take 1 tablet (30 mg total) by mouth 4 (four) times daily as needed.  . fish oil-omega-3 fatty acids 1000 MG capsule Take 1 capsule by mouth daily.    Marland Kitchen lisinopril (PRINIVIL,ZESTRIL) 20 MG tablet Take 1 tablet (20 mg total) by mouth daily.  . metoprolol succinate (TOPROL-XL) 50 MG 24 hr tablet Take 1 tablet (50 mg total) by mouth daily.  . Multiple Vitamin (MULTIVITAMIN) tablet Take 1 tablet by mouth daily.    Marland Kitchen omeprazole (PRILOSEC) 20 MG capsule Take 1 capsule (20 mg total) by mouth daily.  Marland Kitchen penciclovir (DENAVIR) 1 % cream Apply 1 application topically every 2 (two) hours. As needed for blister  . rosuvastatin (CRESTOR) 10 MG tablet Take 1 tablet (10 mg total) by mouth daily.  . [DISCONTINUED] cyclobenzaprine (FLEXERIL) 10 MG tablet Take 1 tablet (10 mg total) by mouth 3 (three) times daily as needed for muscle spasms.  . [DISCONTINUED] traMADol (ULTRAM) 50 MG tablet Take 1 tablet (50 mg total) by mouth every 6 (six) hours as needed.   No facility-administered encounter medications on file as of 03/10/2018.     Activities of Daily Living In your present state of health, do you have any difficulty performing the following activities: 03/10/2018  Hearing? N  Vision? N  Difficulty concentrating or making decisions? N  Walking or climbing stairs? Y  Dressing or bathing? N  Doing errands, shopping? N  Preparing Food and eating ? N  Using the Toilet? N  In the past six months, have you accidently leaked urine? N  Do you have problems with loss of bowel control? N  Managing your Medications? N  Managing your Finances? N  Housekeeping or managing your Housekeeping? N  Some recent data might be hidden    Patient Care Team: Sherlene Shams, MD as PCP - General (Internal Medicine) Antonieta Iba, MD as Consulting Physician (Cardiology)   Assessment:   This is a routine wellness examination for  Mark Farley. The goal of the wellness visit is to assist the patient how to close the gaps in care and create a preventative care plan for the patient.   The roster of all physicians providing medical care to patient is listed in the Snapshot section of the chart.  Osteoarthritis. Taking calcium VIT D as appropriate/Osteoporosis risk reviewed.    Safety issues reviewed; Smoke and carbon monoxide detectors in the home. No firearms or firearms locked in a  safe within the home. Wears seatbelts when driving or riding with others. No violence in the home.  They do not have excessive sun exposure.  Discussed the need for sun protection: hats, long sleeves and the use of sunscreen if there is significant sun exposure.  No new identified risk were noted.  No failures at ADL's or IADL's.    BMI- discussed the importance of a healthy diet, water intake and the benefits of aerobic exercise. Educational material provided.   24 hour diet recall: Regular diet  Eye- Visual acuity not assessed per patient preference since they have regular follow up with the ophthalmologist.  Wears corrective lenses.  Sleep patterns- Sleeps  Through the night.   High dose influenza vaccine administered L deltoid, tolerated well. No verbal complaint during or post administration. Educational material provided.  Labs completed.   Health maintenance gaps- closed.  Patient Concerns: None at this time. Follow up with PCP as needed.  Exercise Activities and Dietary recommendations Current Exercise Habits: Home exercise routine, Type of exercise: calisthenics;strength training/weights, Time (Minutes): > 60(2 hours), Frequency (Times/Week): 5, Weekly Exercise (Minutes/Week): 0, Intensity: Moderate  Goals      Patient Stated   . Weight (lb) < 190 lb (86.2 kg) (pt-stated)     Resume exercise regimen when able Eat healthy       Fall Risk Fall Risk  03/10/2018 03/05/2017 05/29/2016 11/22/2015  Falls in the past  year? No No No No   Depression Screen PHQ 2/9 Scores 03/10/2018 03/05/2017 05/29/2016 11/22/2015  PHQ - 2 Score 1 0 0 0  PHQ- 9 Score - 0 - -    Cognitive Function MMSE - Mini Mental State Exam 03/05/2017  Orientation to time 5  Orientation to Place 5  Registration 3  Attention/ Calculation 5  Recall 3  Language- name 2 objects 2  Language- repeat 1  Language- follow 3 step command 3  Language- read & follow direction 1  Write a sentence 1  Copy design 1  Total score 30     6CIT Screen 03/10/2018  What Year? 0 points  What month? 0 points  What time? 0 points  Count back from 20 0 points  Months in reverse 0 points  Repeat phrase 0 points  Total Score 0    Immunization History  Administered Date(s) Administered  . Influenza Split 02/21/2008, 03/11/2011  . Influenza, High Dose Seasonal PF 03/05/2017, 03/10/2018  . Influenza,inj,Quad PF,6+ Mos 02/24/2014, 05/09/2015  . Influenza-Unspecified 04/01/2012, 04/03/2013, 02/24/2016  . Pneumococcal Conjugate-13 04/09/2014  . Pneumococcal Polysaccharide-23 04/18/2001, 03/11/2011  . Tdap 10/27/2012  . Tetanus 09/05/2008  . Zoster 02/21/2008   Screening Tests Health Maintenance  Topic Date Due  . COLONOSCOPY  01/11/2020  . TETANUS/TDAP  10/28/2022  . INFLUENZA VACCINE  Completed  . Hepatitis C Screening  Completed  . PNA vac Low Risk Adult  Completed        Plan:   End of life planning; Advanced aging; Advanced directives discussed.  No HCPOA/Living Will.  Additional information declined at this time.  I have personally reviewed and noted the following in the patient's chart:   . Medical and social history . Use of alcohol, tobacco or illicit drugs  . Current medications and supplements . Functional ability and status . Nutritional status . Physical activity . Advanced directives . List of other physicians . Hospitalizations, surgeries, and ER visits in previous 12 months . Vitals . Screenings to include  cognitive, depression, and falls . Referrals  and appointments  In addition, I have reviewed and discussed with patient certain preventive protocols, quality metrics, and best practice recommendations. A written personalized care plan for preventive services as well as general preventive health recommendations were provided to patient.     OBrien-Blaney, Bhavya Grand L, LPN  [id number]    I have reviewed the above information and agree with above.   Duncan Dull, MD

## 2018-03-10 NOTE — Patient Instructions (Addendum)
  Mr. Mark Farley , Thank you for taking time to come for your Medicare Wellness Visit. I appreciate your ongoing commitment to your health goals. Please review the following plan we discussed and let me know if I can assist you in the future.   These are the goals we discussed: Goals      Patient Stated   . Weight (lb) < 190 lb (86.2 kg) (pt-stated)     Resume exercise regimen when able Eat healthy       This is a list of the screening recommended for you and due dates:  Health Maintenance  Topic Date Due  . Colon Cancer Screening  01/11/2020  . Tetanus Vaccine  10/28/2022  . Flu Shot  Completed  .  Hepatitis C: One time screening is recommended by Center for Disease Control  (CDC) for  adults born from [birth year] through 1965.   Completed  . Pneumonia vaccines  Completed

## 2018-03-14 ENCOUNTER — Ambulatory Visit (INDEPENDENT_AMBULATORY_CARE_PROVIDER_SITE_OTHER): Payer: Medicare Other | Admitting: Orthopedic Surgery

## 2018-03-14 ENCOUNTER — Encounter (INDEPENDENT_AMBULATORY_CARE_PROVIDER_SITE_OTHER): Payer: Self-pay | Admitting: Orthopedic Surgery

## 2018-03-14 DIAGNOSIS — S838X2A Sprain of other specified parts of left knee, initial encounter: Secondary | ICD-10-CM

## 2018-03-14 NOTE — Progress Notes (Signed)
   Post-Op Visit Note   Patient: Joshual Terrio.           Date of Birth: 15-Jan-1946           MRN: [id number] Visit Date: 03/14/2018 PCP: Sherlene Shams, MD   Assessment & Plan:  Chief Complaint:  Chief Complaint  Patient presents with  . Left Knee - Routine Post Op   Visit Diagnoses:  1. Injury of meniscus of left knee, initial encounter     Plan: [name] is a patient is now about 5 weeks out left knee arthroscopy.  Did have some arthritis in that medial compartment.  On exam he has mild effusion and full range of motion lacking only about 5 degrees of full extension.  Quad strength is excellent.  I meant to have him continue to do non-loadbearing quad strengthening exercises.  Follow-up with me in November specifically the week of Thanksgiving so that he can get injections in the both knees before they get on a cruise.  He is taking Celebrex.  Follow-Up Instructions: No follow-ups on file.   Orders:  No orders of the defined types were placed in this encounter.  No orders of the defined types were placed in this encounter.   Imaging: No results found.  PMFS History: Patient Active Problem List   Diagnosis Date Noted  . Intention tremor 03/09/2017  . Tingling of left upper extremity 03/09/2017  . Runner's knee, right 03/09/2017  . Elevated ALT measurement 03/09/2017  . Hearing loss due to cerumen impaction 03/08/2017  . Encounter for preventive health examination 05/26/2015  . SVT (supraventricular tachycardia) (HCC) 02/07/2015  . History of syncope 03/06/2014  . PVC's (premature ventricular contractions) 03/06/2014  . Osteoarthritis 02/08/2012  . Prediabetes 08/06/2011  . Overweight (BMI 25.0-29.9) 02/06/2011  . Family history of colon cancer requiring screening colonoscopy 02/05/2011  . Hyperlipidemia 08/14/2008  . HYPERTENSION, BENIGN 08/14/2008   Past Medical History:  Diagnosis Date  . Arthritis    ? Gout  . Bilateral carpal tunnel syndrome    managed with nocturnal braces x 3 years  . COPD (chronic obstructive pulmonary disease) (HCC)   . Glucose intolerance (impaired glucose tolerance)   . History of syncope 2005   Unexplained, myoview negative  . Hyperlipidemia   . Hypertension   . Iron deficiency anemia   . Osteoarthritis of multiple joints   . Palpitations 2010   Life Watch with single PVC    Family History  Problem Relation Age of Onset  . Coronary artery disease Mother   . Heart attack Mother        MI  . Heart failure Father        CHF  . Colon cancer Brother   . Colon cancer Paternal Grandmother     Past Surgical History:  Procedure Laterality Date  . COLONOSCOPY     Social History   Occupational History  . Not on file  Tobacco Use  . Smoking status: Never Smoker  . Smokeless tobacco: Never Used  Substance and Sexual Activity  . Alcohol use: Yes    Alcohol/week: 14.0 standard drinks    Types: 14 Standard drinks or equivalent per week    Comment: 2 glasses of wine a day  . Drug use: No  . Sexual activity: Yes

## 2018-03-17 ENCOUNTER — Other Ambulatory Visit: Payer: Medicare Other

## 2018-03-17 ENCOUNTER — Ambulatory Visit: Payer: Medicare Other

## 2018-03-23 ENCOUNTER — Ambulatory Visit (INDEPENDENT_AMBULATORY_CARE_PROVIDER_SITE_OTHER): Payer: Medicare Other | Admitting: Internal Medicine

## 2018-03-23 ENCOUNTER — Encounter: Payer: Self-pay | Admitting: Internal Medicine

## 2018-03-23 VITALS — BP 114/74 | HR 73 | Temp 98.2°F | Resp 15 | Ht 67.0 in | Wt 201.2 lb

## 2018-03-23 DIAGNOSIS — R7401 Elevation of levels of liver transaminase levels: Secondary | ICD-10-CM

## 2018-03-23 DIAGNOSIS — R748 Abnormal levels of other serum enzymes: Secondary | ICD-10-CM

## 2018-03-23 DIAGNOSIS — I471 Supraventricular tachycardia: Secondary | ICD-10-CM

## 2018-03-23 DIAGNOSIS — R740 Nonspecific elevation of levels of transaminase and lactic acid dehydrogenase [LDH]: Secondary | ICD-10-CM | POA: Diagnosis not present

## 2018-03-23 DIAGNOSIS — E7800 Pure hypercholesterolemia, unspecified: Secondary | ICD-10-CM | POA: Diagnosis not present

## 2018-03-23 DIAGNOSIS — I10 Essential (primary) hypertension: Secondary | ICD-10-CM | POA: Diagnosis not present

## 2018-03-23 DIAGNOSIS — Z0000 Encounter for general adult medical examination without abnormal findings: Secondary | ICD-10-CM

## 2018-03-23 DIAGNOSIS — Z125 Encounter for screening for malignant neoplasm of prostate: Secondary | ICD-10-CM

## 2018-03-23 DIAGNOSIS — R7303 Prediabetes: Secondary | ICD-10-CM | POA: Diagnosis not present

## 2018-03-23 LAB — HEPATIC FUNCTION PANEL
ALK PHOS: 59 U/L (ref 39–117)
ALT: 89 U/L — AB (ref 0–53)
AST: 58 U/L — AB (ref 0–37)
Albumin: 4.8 g/dL (ref 3.5–5.2)
BILIRUBIN DIRECT: 0.2 mg/dL (ref 0.0–0.3)
TOTAL PROTEIN: 7.4 g/dL (ref 6.0–8.3)
Total Bilirubin: 1 mg/dL (ref 0.2–1.2)

## 2018-03-23 LAB — PSA, MEDICARE: PSA: 0.59 ng/ml (ref 0.10–4.00)

## 2018-03-23 MED ORDER — OLMESARTAN MEDOXOMIL 20 MG PO TABS: 20.0000 mg | ORAL_TABLET | Freq: Every day | ORAL | 2 refills | Status: DC

## 2018-03-23 MED ORDER — ZOSTER VAC RECOMB ADJUVANTED 50 MCG/0.5ML IM SUSR: 0.5000 mL | Freq: Once | INTRAMUSCULAR | 1 refills | Status: AC

## 2018-03-23 MED ORDER — PENCICLOVIR 1 % EX CREA: 1.0000 "application " | TOPICAL_CREAM | CUTANEOUS | 1 refills | Status: DC

## 2018-03-23 NOTE — Progress Notes (Signed)
Patient ID: Mark Farley., male    DOB: 1945/08/20  Age: 72 y.o. MRN: [id number]  The patient is here for annual wellness examination and management of other chronic and acute problems.   The risk factors are reflected in the social history.  The roster of all physicians providing medical care to patient - is listed in the Snapshot section of the chart.  Activities of daily living:  The patient is 100% independent in all ADLs: dressing, toileting, feeding as well as independent mobility  Home safety : The patient has smoke detectors in the home. They wear seatbelts.  There are no firearms at home. There is no violence in the home.   There is no risks for hepatitis, STDs or HIV. There is no   history of blood transfusion. They have no travel history to infectious disease endemic areas of the world.  The patient has seen their dentist in the last six month. They have seen their eye doctor in the last year. They admit to slight hearing difficulty with regard to whispered voices and some television programs.  They have deferred audiologic testing in the last year.  They do not  have excessive sun exposure. Discussed the need for sun protection: hats, long sleeves and use of sunscreen if there is significant sun exposure.   Diet: the importance of a healthy diet is discussed. They do have a healthy diet.  The benefits of regular aerobic exercise were discussed. She walks 4 times per week ,  20 minutes.   Depression screen: there are no signs or vegative symptoms of depression- irritability, change in appetite, anhedonia, sadness/tearfullness.  Cognitive assessment: the patient manages all their financial and personal affairs and is actively engaged. They could relate day,date,year and events; recalled 2/3 objects at 3 minutes; performed clock-face test normally.  The following portions of the patient's history were reviewed and updated as appropriate: allergies, current medications, past  family history, past medical history,  past surgical history, past social history  and problem list.  Visual acuity was not assessed per patient preference since she has regular follow up with her ophthalmologist. Hearing and body mass index were assessed and reviewed.   During the course of the visit the patient was educated and counseled about appropriate screening and preventive services including : fall prevention , diabetes screening, nutrition counseling, colorectal cancer screening, and recommended immunizations.    CC: The primary encounter diagnosis was Prostate cancer screening. Diagnoses of Elevated liver enzymes, Encounter for preventive health examination, Elevated ALT measurement, Pure hypercholesterolemia, HYPERTENSION, BENIGN, Prediabetes, and SVT (supraventricular tachycardia) (HCC) were also pertinent to this visit.  Weight gain of 13 lbs since last year due to decreased exercise due to orthopedic issues. left tknee cartilage repair by Dorene Grebe at [facility] orthopedics  Hands bothering him .     History Kerolos has a past medical history of Arthritis, Bilateral carpal tunnel syndrome, COPD (chronic obstructive pulmonary disease) (HCC), Glucose intolerance (impaired glucose tolerance), History of syncope (2005), Hyperlipidemia, Hypertension, Iron deficiency anemia, Osteoarthritis of multiple joints, and Palpitations (2010).   He has a past surgical history that includes Colonoscopy.   His family history includes Colon cancer in his brother and paternal grandmother; Coronary artery disease in his mother; Heart attack in his mother; Heart failure in his father.He reports that he has never smoked. He has never used smokeless tobacco. He reports that he drinks about 14.0 standard drinks of alcohol per week. He reports that he does not use  drugs.  Outpatient Medications Prior to Visit  Medication Sig Dispense Refill  . celecoxib (CELEBREX) 200 MG capsule Take 1 capsule (200 mg  total) by mouth 2 (two) times daily. As needed for joint pain 180 capsule 3  . diltiazem (CARDIZEM) 30 MG tablet Take 1 tablet (30 mg total) by mouth 4 (four) times daily as needed. 90 tablet 3  . fish oil-omega-3 fatty acids 1000 MG capsule Take 1 capsule by mouth daily.      . metoprolol succinate (TOPROL-XL) 50 MG 24 hr tablet Take 1 tablet (50 mg total) by mouth daily. 90 tablet 3  . Multiple Vitamin (MULTIVITAMIN) tablet Take 1 tablet by mouth daily.      Marland Kitchen omeprazole (PRILOSEC) 20 MG capsule Take 1 capsule (20 mg total) by mouth daily. 90 capsule 3  . penciclovir (DENAVIR) 1 % cream Apply 1 application topically every 2 (two) hours. As needed for blister 1.5 g 3  . rosuvastatin (CRESTOR) 10 MG tablet Take 1 tablet (10 mg total) by mouth daily. 90 tablet 3  . amLODipine (NORVASC) 10 MG tablet Take 1 tablet (10 mg total) by mouth daily. (Patient taking differently: Take 5 mg by mouth daily. ) 90 tablet 3  . lisinopril (PRINIVIL,ZESTRIL) 20 MG tablet Take 1 tablet (20 mg total) by mouth daily. 90 tablet 3   No facility-administered medications prior to visit.     Review of Systems  Objective:  BP 114/74 (BP Location: Left Arm, Patient Position: Sitting, Cuff Size: Normal)   Pulse 73   Temp 98.2 F (36.8 C) (Oral)   Resp 15   Ht 5\' 7"  (1.702 m)   Wt 201 lb 3.2 oz (91.3 kg)   SpO2 95%   BMI 31.51 kg/m   Physical Exam    Assessment & Plan:   Problem List Items Addressed This Visit    Elevated ALT measurement     Previously attributed to muscle enzyme elevation, but ALT and AST  are again elevated despite recent cessation from workout. .  Workup for autoimmune and fatty liver  Initiated.   Lab Results  Component Value Date   ALT 89 (H) 03/23/2018   AST 58 (H) 03/23/2018   ALKPHOS 59 03/23/2018   BILITOT 1.0 03/23/2018         Relevant Orders   Hepatitis, Acute   ANA   Anti-Smith antibody   Ferritin   Iron and TIBC   Mitochondrial antibodies   US Abdomen Limited    Encounter for preventive health examination    age appropriate education and counseling updated, referrals for preventative services and immunizations addressed, dietary and smoking counseling addressed, most recent labs reviewed.  I have personally reviewed and have noted:  1) the patient's medical and social history 2) The pt's use of alcohol, tobacco, and illicit drugs 3) The patient's current medications and supplements 4) Functional ability including ADL's, fall risk, home safety risk, hearing and visual impairment 5) Diet and physical activities 6) Evidence for depression or mood disorder 7) The patient's height, weight, and BMI have been recorded in the chart  I have made referrals, and provided counseling and education based on review of the above      Hyperlipidemia    He has resumed Crestor 10 mg daily after a suspension of statin therapy  did not resolve his nocturnal muscle cramps and his LDL rose from 82 to 169 without therapy.     Lab Results  Component Value Date   CHOL 145  03/10/2018   HDL 49.70 03/10/2018   LDLCALC 68 03/10/2018   LDLDIRECT 81.0 03/08/2017   TRIG 139.0 03/10/2018   CHOLHDL 3 03/10/2018    Lab Results  Component Value Date   ALT 89 (H) 03/23/2018   AST 58 (H) 03/23/2018   ALKPHOS 59 03/23/2018   BILITOT 1.0 03/23/2018         Relevant Medications   olmesartan (BENICAR) 20 MG tablet   HYPERTENSION, BENIGN    Well controlled on current regimen. Renal function stable, no changes today.  Lab Results  Component Value Date   CREATININE 0.77 03/10/2018   Lab Results  Component Value Date   NA 133 (L) 03/10/2018   K 5.0 03/10/2018   CL 98 03/10/2018   CO2 29 03/10/2018         Relevant Medications   olmesartan (BENICAR) 20 MG tablet   Prediabetes    Managed with low GI diet.  A1c  Has improved    Lab Results  Component Value Date   HGBA1C 5.9 03/10/2018         SVT (supraventricular tachycardia) (HCC)    Managed with  low dose torol and cardizem.Marland Kitchen  No changes today      Relevant Medications   olmesartan (BENICAR) 20 MG tablet    Other Visit Diagnoses    Prostate cancer screening    -  Primary   Relevant Orders   PSA, Medicare (Completed)   Elevated liver enzymes       Relevant Orders   Hepatic function panel (Completed)      I have discontinued Benay Pillow. "Tony"'s amLODipine and lisinopril. I am also having him start on olmesartan, penciclovir, and Zoster Vaccine Adjuvanted. Additionally, I am having him maintain his fish oil-omega-3 fatty acids, multivitamin, penciclovir, omeprazole, diltiazem, metoprolol succinate, rosuvastatin, and celecoxib.  Meds ordered this encounter  Medications  . olmesartan (BENICAR) 20 MG tablet    Sig: Take 1 tablet (20 mg total) by mouth daily.    Dispense:  90 tablet    Refill:  2  . penciclovir (DENAVIR) 1 % cream    Sig: Apply 1 application topically every 2 (two) hours.    Dispense:  5 g    Refill:  1  . Zoster Vaccine Adjuvanted [facility]) injection    Sig: Inject 0.5 mLs into the muscle once for 1 dose.    Dispense:  1 each    Refill:  1    Medications Discontinued During This Encounter  Medication Reason  . amLODipine (NORVASC) 10 MG tablet   . lisinopril (PRINIVIL,ZESTRIL) 20 MG tablet     Follow-up: Return in about 6 months (around 09/22/2018).   Sherlene Shams, MD

## 2018-03-23 NOTE — Patient Instructions (Addendum)
celebrex 200 mg daily  You can add up to 2000 mg of acetominophen (tylenol) every day safely  In divided doses (500 mg every 6 hours  Or 1000 mg every 12 hours.)  Try intermittent  Fasting (fasting from 7 pm to 11 am daily)     You are retaining fluid because of your medications (amlodipine and celebrex)  We are stopping amlodipine and lisinopril  And starting olmesartan 20 mg daily. In the morning  Continue metoprolol at night   YOU DO NOT HAVE DIABETES!  Your A1c is going down.  Limit sodas to one daily  Limit alcohol to 2 servings daily     Try the SOLA low carb bread ([employer]  Frozen bread )  3 g/slice  Tastes great!   Health Maintenance, Male A healthy lifestyle and preventive care is important for your health and wellness. Ask your health care provider about what schedule of regular examinations is right for you. What should I know about weight and diet? Eat a Healthy Diet  Eat plenty of vegetables, fruits, whole grains, low-fat dairy products, and lean protein.  Do not eat a lot of foods high in solid fats, added sugars, or salt.  Maintain a Healthy Weight Regular exercise can help you achieve or maintain a healthy weight. You should:  Do at least 150 minutes of exercise each week. The exercise should increase your heart rate and make you sweat (moderate-intensity exercise).  Do strength-training exercises at least twice a week.  Watch Your Levels of Cholesterol and Blood Lipids  Have your blood tested for lipids and cholesterol every 5 years starting at 72 years of age. If you are at high risk for heart disease, you should start having your blood tested when you are 72 years old. You may need to have your cholesterol levels checked more often if: ? Your lipid or cholesterol levels are high. ? You are older than 72 years of age. ? You are at high risk for heart disease.  What should I know about cancer screening? Many types of cancers can be detected early  and may often be prevented. Lung Cancer  You should be screened every year for lung cancer if: ? You are a current smoker who has smoked for at least 30 years. ? You are a former smoker who has quit within the past 15 years.  Talk to your health care provider about your screening options, when you should start screening, and how often you should be screened.  Colorectal Cancer  Routine colorectal cancer screening usually begins at 72 years of age and should be repeated every 5-10 years until you are 72 years old. You may need to be screened more often if early forms of precancerous polyps or small growths are found. Your health care provider may recommend screening at an earlier age if you have risk factors for colon cancer.  Your health care provider may recommend using home test kits to check for hidden blood in the stool.  A small camera at the end of a tube can be used to examine your colon (sigmoidoscopy or colonoscopy). This checks for the earliest forms of colorectal cancer.  Prostate and Testicular Cancer  Depending on your age and overall health, your health care provider may do certain tests to screen for prostate and testicular cancer.  Talk to your health care provider about any symptoms or concerns you have about testicular or prostate cancer.  Skin Cancer  Check your skin from head  to toe regularly.  Tell your health care provider about any new moles or changes in moles, especially if: ? There is a change in a mole's size, shape, or color. ? You have a mole that is larger than a pencil eraser.  Always use sunscreen. Apply sunscreen liberally and repeat throughout the day.  Protect yourself by wearing long sleeves, pants, a wide-brimmed hat, and sunglasses when outside.  What should I know about heart disease, diabetes, and high blood pressure?  If you are 47-87 years of age, have your blood pressure checked every 3-5 years. If you are 75 years of age or older, have  your blood pressure checked every year. You should have your blood pressure measured twice-once when you are at a hospital or clinic, and once when you are not at a hospital or clinic. Record the average of the two measurements. To check your blood pressure when you are not at a hospital or clinic, you can use: ? An automated blood pressure machine at a pharmacy. ? A home blood pressure monitor.  Talk to your health care provider about your target blood pressure.  If you are between 40-66 years old, ask your health care provider if you should take aspirin to prevent heart disease.  Have regular diabetes screenings by checking your fasting blood sugar level. ? If you are at a normal weight and have a low risk for diabetes, have this test once every three years after the age of 39. ? If you are overweight and have a high risk for diabetes, consider being tested at a younger age or more often.  A one-time screening for abdominal aortic aneurysm (AAA) by ultrasound is recommended for men aged 65-75 years who are current or former smokers. What should I know about preventing infection? Hepatitis B If you have a higher risk for hepatitis B, you should be screened for this virus. Talk with your health care provider to find out if you are at risk for hepatitis B infection. Hepatitis C Blood testing is recommended for:  Everyone born from [birth year] through 1965.  Anyone with known risk factors for hepatitis C.  Sexually Transmitted Diseases (STDs)  You should be screened each year for STDs including gonorrhea and chlamydia if: ? You are sexually active and are younger than 72 years of age. ? You are older than 72 years of age and your health care provider tells you that you are at risk for this type of infection. ? Your sexual activity has changed since you were last screened and you are at an increased risk for chlamydia or gonorrhea. Ask your health care provider if you are at risk.  Talk with your  health care provider about whether you are at high risk of being infected with HIV. Your health care provider may recommend a prescription medicine to help prevent HIV infection.  What else can I do?  Schedule regular health, dental, and eye exams.  Stay current with your vaccines (immunizations).  Do not use any tobacco products, such as cigarettes, chewing tobacco, and e-cigarettes. If you need help quitting, ask your health care provider.  Limit alcohol intake to no more than 2 drinks per day. One drink equals 12 ounces of beer, 5 ounces of wine, or 1 ounces of hard liquor.  Do not use street drugs.  Do not share needles.  Ask your health care provider for help if you need support or information about quitting drugs.  Tell your health care provider if  you often feel depressed.  Tell your health care provider if you have ever been abused or do not feel safe at home. This information is not intended to replace advice given to you by your health care provider. Make sure you discuss any questions you have with your health care provider. Document Released: 11/14/2007 Document Revised: 01/15/2016 Document Reviewed: 02/19/2015 Elsevier Interactive Patient Education  [employer].

## 2018-03-24 NOTE — Assessment & Plan Note (Signed)
Managed with low dose torol and cardizem.Marland Kitchen  No changes today

## 2018-03-24 NOTE — Assessment & Plan Note (Signed)
Managed with low GI diet.  A1c  Has improved    Lab Results  Component Value Date   HGBA1C 5.9 03/10/2018

## 2018-03-24 NOTE — Assessment & Plan Note (Signed)

## 2018-03-24 NOTE — Assessment & Plan Note (Signed)
He has resumed Crestor 10 mg daily after a suspension of statin therapy  did not resolve his nocturnal muscle cramps and his LDL rose from 82 to 169 without therapy.     Lab Results  Component Value Date   CHOL 145 03/10/2018   HDL 49.70 03/10/2018   LDLCALC 68 03/10/2018   LDLDIRECT 81.0 03/08/2017   TRIG 139.0 03/10/2018   CHOLHDL 3 03/10/2018    Lab Results  Component Value Date   ALT 89 (H) 03/23/2018   AST 58 (H) 03/23/2018   ALKPHOS 59 03/23/2018   BILITOT 1.0 03/23/2018

## 2018-03-24 NOTE — Assessment & Plan Note (Addendum)
  Previously attributed to muscle enzyme elevation, but ALT and AST  are again elevated despite recent cessation from workout. .  Workup for autoimmune and fatty liver  Initiated.   Lab Results  Component Value Date   ALT 89 (H) 03/23/2018   AST 58 (H) 03/23/2018   ALKPHOS 59 03/23/2018   BILITOT 1.0 03/23/2018

## 2018-03-24 NOTE — Assessment & Plan Note (Signed)
Well controlled on current regimen. Renal function stable, no changes today.  Lab Results  Component Value Date   CREATININE 0.77 03/10/2018   Lab Results  Component Value Date   NA 133 (L) 03/10/2018   K 5.0 03/10/2018   CL 98 03/10/2018   CO2 29 03/10/2018

## 2018-03-28 ENCOUNTER — Other Ambulatory Visit: Payer: Self-pay | Admitting: Internal Medicine

## 2018-03-28 DIAGNOSIS — R740 Nonspecific elevation of levels of transaminase and lactic acid dehydrogenase [LDH]: Principal | ICD-10-CM

## 2018-03-28 DIAGNOSIS — R7401 Elevation of levels of liver transaminase levels: Secondary | ICD-10-CM

## 2018-03-28 NOTE — Progress Notes (Signed)
amb re 

## 2018-04-01 ENCOUNTER — Telehealth: Payer: Self-pay | Admitting: Radiology

## 2018-04-01 DIAGNOSIS — R748 Abnormal levels of other serum enzymes: Secondary | ICD-10-CM

## 2018-04-01 NOTE — Telephone Encounter (Signed)
Pt coming in for labs Monday, please place future orders. Thank you 

## 2018-04-03 NOTE — Addendum Note (Signed)
Addended by: Sherlene Shams on: 04/03/2018 07:24 AM   Modules accepted: Orders

## 2018-04-03 NOTE — Telephone Encounter (Signed)
They were ordered on October 24 during a results message.  Not sure why you can't see them.  I have ordered them again but left the others in place so you can check to see if you can see them and let me know .  Do not send both.

## 2018-04-04 ENCOUNTER — Other Ambulatory Visit (INDEPENDENT_AMBULATORY_CARE_PROVIDER_SITE_OTHER): Payer: Medicare Other

## 2018-04-04 DIAGNOSIS — R748 Abnormal levels of other serum enzymes: Secondary | ICD-10-CM | POA: Diagnosis not present

## 2018-04-04 NOTE — Telephone Encounter (Signed)
The previous orders were not placed future so lab staff wouldn't be able to see them. Future orders that have been replaced are visible. Thank you.

## 2018-04-04 NOTE — Addendum Note (Signed)
Addended by: Penne Lash on: 04/04/2018 09:29 AM   Modules accepted: Orders

## 2018-04-05 ENCOUNTER — Telehealth: Payer: Self-pay | Admitting: Internal Medicine

## 2018-04-05 ENCOUNTER — Other Ambulatory Visit: Payer: Self-pay | Admitting: Internal Medicine

## 2018-04-05 MED ORDER — TELMISARTAN-HCTZ 40-12.5 MG PO TABS: 1.0000 | ORAL_TABLET | Freq: Every day | ORAL | 2 refills | Status: DC

## 2018-04-05 NOTE — Telephone Encounter (Signed)
Form has been placed in red folder.  

## 2018-04-05 NOTE — Progress Notes (Signed)
He has to try at least one of the covred alternatives before they will authorize olmesartan .  I have printed telmisartan/hct for a trial  .  One tablet daily in the am.

## 2018-04-05 NOTE — Telephone Encounter (Signed)
Pt dropped off a pro-authorization form for his olmesartan (BENICAR) 20 MG tablet . Form is up front in color folder.

## 2018-04-06 ENCOUNTER — Other Ambulatory Visit: Payer: Self-pay | Admitting: Internal Medicine

## 2018-04-06 DIAGNOSIS — R740 Nonspecific elevation of levels of transaminase and lactic acid dehydrogenase [LDH]: Principal | ICD-10-CM

## 2018-04-06 DIAGNOSIS — R7401 Elevation of levels of liver transaminase levels: Secondary | ICD-10-CM

## 2018-04-06 LAB — IRON,TIBC AND FERRITIN PANEL
%SAT: 34 % (ref 20–48)
FERRITIN: 384 ng/mL — AB (ref 24–380)
Iron: 117 ug/dL (ref 50–180)
TIBC: 340 mcg/dL (calc) (ref 250–425)

## 2018-04-06 LAB — HEPATIC FUNCTION PANEL
AG Ratio: 1.9 (calc) (ref 1.0–2.5)
ALBUMIN MSPROF: 4.3 g/dL (ref 3.6–5.1)
ALT: 92 U/L — ABNORMAL HIGH (ref 9–46)
AST: 65 U/L — ABNORMAL HIGH (ref 10–35)
Alkaline phosphatase (APISO): 56 U/L (ref 40–115)
Bilirubin, Direct: 0.2 mg/dL (ref 0.0–0.2)
GLOBULIN: 2.3 g/dL (ref 1.9–3.7)
Indirect Bilirubin: 0.7 mg/dL (calc) (ref 0.2–1.2)
TOTAL PROTEIN: 6.6 g/dL (ref 6.1–8.1)
Total Bilirubin: 0.9 mg/dL (ref 0.2–1.2)

## 2018-04-06 LAB — ANTI-SMITH ANTIBODY: ENA SM AB SER-ACNC: NEGATIVE AI

## 2018-04-06 LAB — ANA: Anti Nuclear Antibody(ANA): NEGATIVE

## 2018-04-06 LAB — MITOCHONDRIAL ANTIBODIES

## 2018-04-06 NOTE — Progress Notes (Signed)
patic 

## 2018-04-14 ENCOUNTER — Ambulatory Visit
Admission: RE | Admit: 2018-04-14 | Discharge: 2018-04-14 | Disposition: A | Discharge status: Home / Self Care | Payer: Medicare Other | Source: Ambulatory Visit | Attending: Internal Medicine | Admitting: Internal Medicine

## 2018-04-14 DIAGNOSIS — R7401 Elevation of levels of liver transaminase levels: Secondary | ICD-10-CM

## 2018-04-14 DIAGNOSIS — K760 Fatty (change of) liver, not elsewhere classified: Secondary | ICD-10-CM | POA: Diagnosis not present

## 2018-04-14 DIAGNOSIS — R740 Nonspecific elevation of levels of transaminase and lactic acid dehydrogenase [LDH]: Secondary | ICD-10-CM | POA: Insufficient documentation

## 2018-04-15 ENCOUNTER — Other Ambulatory Visit: Payer: Self-pay | Admitting: Internal Medicine

## 2018-04-15 DIAGNOSIS — K760 Fatty (change of) liver, not elsewhere classified: Secondary | ICD-10-CM

## 2018-04-18 ENCOUNTER — Telehealth: Payer: Self-pay | Admitting: Internal Medicine

## 2018-04-18 NOTE — Telephone Encounter (Signed)
Copied from CRM [id number]. Topic: General - Other >> Apr 18, 2018 12:17 PM Leafy Roobinson, Norma J wrote: Reason for CRM: thomas pharmacist at [place] is calling and needs new rx telmisartan/hct . Pt will bring up hard copy to take to [place]

## 2018-04-19 ENCOUNTER — Ambulatory Visit (INDEPENDENT_AMBULATORY_CARE_PROVIDER_SITE_OTHER): Payer: Medicare Other | Admitting: *Deleted

## 2018-04-19 DIAGNOSIS — Z23 Encounter for immunization: Secondary | ICD-10-CM | POA: Diagnosis not present

## 2018-04-19 MED ORDER — TELMISARTAN-HCTZ 40-12.5 MG PO TABS: 1.0000 | ORAL_TABLET | Freq: Every day | ORAL | 2 refills | Status: DC

## 2018-04-19 NOTE — Telephone Encounter (Signed)
Pt stated that he will pick up the rx today when he comes in for his nurse visit.

## 2018-04-21 ENCOUNTER — Other Ambulatory Visit: Payer: Self-pay | Admitting: Internal Medicine

## 2018-04-21 MED ORDER — TRAMADOL HCL 50 MG PO TABS: 50.0000 mg | ORAL_TABLET | Freq: Four times a day (QID) | ORAL | 0 refills | Status: DC | PRN

## 2018-04-25 ENCOUNTER — Other Ambulatory Visit (INDEPENDENT_AMBULATORY_CARE_PROVIDER_SITE_OTHER): Payer: Medicare Other

## 2018-04-25 DIAGNOSIS — R740 Nonspecific elevation of levels of transaminase and lactic acid dehydrogenase [LDH]: Secondary | ICD-10-CM

## 2018-04-25 DIAGNOSIS — R7401 Elevation of levels of liver transaminase levels: Secondary | ICD-10-CM

## 2018-04-25 LAB — HEPATIC FUNCTION PANEL
ALK PHOS: 58 U/L (ref 39–117)
ALT: 76 U/L — AB (ref 0–53)
AST: 51 U/L — AB (ref 0–37)
Albumin: 4.3 g/dL (ref 3.5–5.2)
BILIRUBIN DIRECT: 0.1 mg/dL (ref 0.0–0.3)
TOTAL PROTEIN: 6.9 g/dL (ref 6.0–8.3)
Total Bilirubin: 0.8 mg/dL (ref 0.2–1.2)

## 2018-04-26 ENCOUNTER — Other Ambulatory Visit: Payer: Self-pay | Admitting: Internal Medicine

## 2018-04-26 DIAGNOSIS — K760 Fatty (change of) liver, not elsewhere classified: Secondary | ICD-10-CM

## 2018-04-26 NOTE — Progress Notes (Signed)
hepaic

## 2018-04-27 ENCOUNTER — Encounter (INDEPENDENT_AMBULATORY_CARE_PROVIDER_SITE_OTHER): Payer: Self-pay | Admitting: Orthopedic Surgery

## 2018-04-27 ENCOUNTER — Ambulatory Visit (INDEPENDENT_AMBULATORY_CARE_PROVIDER_SITE_OTHER): Payer: Medicare Other | Admitting: Orthopedic Surgery

## 2018-04-27 VITALS — Ht 67.0 in | Wt 201.0 lb

## 2018-04-27 DIAGNOSIS — S838X2A Sprain of other specified parts of left knee, initial encounter: Secondary | ICD-10-CM

## 2018-04-27 DIAGNOSIS — M1712 Unilateral primary osteoarthritis, left knee: Secondary | ICD-10-CM | POA: Diagnosis not present

## 2018-04-27 DIAGNOSIS — M1711 Unilateral primary osteoarthritis, right knee: Secondary | ICD-10-CM

## 2018-04-28 ENCOUNTER — Encounter (INDEPENDENT_AMBULATORY_CARE_PROVIDER_SITE_OTHER): Payer: Self-pay | Admitting: Orthopedic Surgery

## 2018-04-28 DIAGNOSIS — M1711 Unilateral primary osteoarthritis, right knee: Secondary | ICD-10-CM

## 2018-04-28 DIAGNOSIS — M1712 Unilateral primary osteoarthritis, left knee: Secondary | ICD-10-CM

## 2018-04-28 MED ORDER — BUPIVACAINE HCL 0.25 % IJ SOLN
4.0000 mL | INTRAMUSCULAR | Status: AC | PRN
  Administered 2018-04-28: 4 mL via INTRA_ARTICULAR

## 2018-04-28 MED ORDER — LIDOCAINE HCL 1 % IJ SOLN
5.0000 mL | INTRAMUSCULAR | Status: AC | PRN
  Administered 2018-04-28: 5 mL

## 2018-04-28 MED ORDER — METHYLPREDNISOLONE ACETATE 40 MG/ML IJ SUSP
40.0000 mg | INTRAMUSCULAR | Status: AC | PRN
  Administered 2018-04-28: 40 mg via INTRA_ARTICULAR

## 2018-04-28 NOTE — Progress Notes (Signed)
Post-Op Visit Note   Patient: Mark Pillownthony J Pellerito Jr.           Date of Birth: 03/06/1946           MRN: [id number] Visit Date: 04/27/2018 PCP: Sherlene Shamsullo, Teresa L, MD   Assessment & Plan:  Chief Complaint:  Chief Complaint  Patient presents with  . Left Knee - Pain    02/07/18 Left Knee Scope, Medial and lateral meniscectomy  . Right Knee - Pain   Visit Diagnoses:  1. Injury of meniscus of left knee, initial encounter   2. Unilateral primary osteoarthritis, left knee   3. Unilateral primary osteoarthritis, right knee     Plan: [name] is now almost 3 months out knee arthroscopy with medial lateral meniscectomy.  Had some arthritis in the knee at that time.  States his left knee is coming along.  He would like to have both knees injected today because he his wife are going on a cruise in a week.  On examination he has full range of motion of that right knee.  Left knee has mild effusion right knee no effusion.  Extensor mechanism is intact.  No other masses lymphadenopathy or skin changes noted in that left knee region Tylenol is not giving him any relief.  Tramadol is giving him minimal relief.  Procedure Note  Patient: Mark Pillownthony J Prows Jr.             Date of Birth: 10/11/1945           MRN: [id number]             Visit Date: 04/27/2018  Procedures: Visit Diagnoses: Injury of meniscus of left knee, initial encounter  Unilateral primary osteoarthritis, left knee  Unilateral primary osteoarthritis, right knee  Large Joint Inj: bilateral knee on 04/28/2018 8:53 AM Indications: diagnostic evaluation, joint swelling and pain Details: 18 G 1.5 in needle, superolateral approach  Arthrogram: No  Medications (Right): 5 mL lidocaine 1 %; 4 mL bupivacaine 0.25 %; 40 mg methylPREDNISolone acetate 40 MG/ML Medications (Left): 5 mL lidocaine 1 %; 4 mL bupivacaine 0.25 %; 40 mg methylPREDNISolone acetate 40 MG/ML Outcome: tolerated well, no immediate complications Procedure, treatment  alternatives, risks and benefits explained, specific risks discussed. Consent was given by the patient. Immediately prior to procedure a time out was called to verify the correct patient, procedure, equipment, support staff and site/side marked as required. Patient was prepped and draped in the usual sterile fashion.        Follow-Up Instructions: Return if symptoms worsen or fail to improve.   Orders:  No orders of the defined types were placed in this encounter.  No orders of the defined types were placed in this encounter.   Imaging: No results found.  PMFS History: Patient Active Problem List   Diagnosis Date Noted  . Intention tremor 03/09/2017  . Tingling of left upper extremity 03/09/2017  . Runner's knee, right 03/09/2017  . Elevated ALT measurement 03/09/2017  . Hearing loss due to cerumen impaction 03/08/2017  . Encounter for preventive health examination 05/26/2015  . SVT (supraventricular tachycardia) (HCC) 02/07/2015  . History of syncope 03/06/2014  . PVC's (premature ventricular contractions) 03/06/2014  . Osteoarthritis 02/08/2012  . Prediabetes 08/06/2011  . Overweight (BMI 25.0-29.9) 02/06/2011  . Family history of colon cancer requiring screening colonoscopy 02/05/2011  . Hyperlipidemia 08/14/2008  . HYPERTENSION, BENIGN 08/14/2008   Past Medical History:  Diagnosis Date  . Arthritis    ? Gout  .  Bilateral carpal tunnel syndrome    managed with nocturnal braces x 3 years  . COPD (chronic obstructive pulmonary disease) (HCC)   . Glucose intolerance (impaired glucose tolerance)   . History of syncope 2005   Unexplained, myoview negative  . Hyperlipidemia   . Hypertension   . Iron deficiency anemia   . Osteoarthritis of multiple joints   . Palpitations 2010   Life Watch with single PVC    Family History  Problem Relation Age of Onset  . Coronary artery disease Mother   . Heart attack Mother        MI  . Heart failure Father        CHF  .  Colon cancer Brother   . Colon cancer Paternal Grandmother     Past Surgical History:  Procedure Laterality Date  . COLONOSCOPY     Social History   Occupational History  . Not on file  Tobacco Use  . Smoking status: Never Smoker  . Smokeless tobacco: Never Used  Substance and Sexual Activity  . Alcohol use: Yes    Alcohol/week: 14.0 standard drinks    Types: 14 Standard drinks or equivalent per week    Comment: 2 glasses of wine a day  . Drug use: No  . Sexual activity: Yes

## 2018-05-17 ENCOUNTER — Encounter: Payer: Self-pay | Admitting: Gastroenterology

## 2018-05-17 ENCOUNTER — Ambulatory Visit (INDEPENDENT_AMBULATORY_CARE_PROVIDER_SITE_OTHER): Payer: Medicare Other | Admitting: Gastroenterology

## 2018-05-17 VITALS — BP 134/79 | HR 69 | Ht 67.0 in | Wt 201.2 lb

## 2018-05-17 DIAGNOSIS — R748 Abnormal levels of other serum enzymes: Secondary | ICD-10-CM

## 2018-05-17 NOTE — Progress Notes (Signed)
Mark Darby, MD [address]  [place]  [place],  [address]  Main: [phone]  Fax: [phone]    Gastroenterology Consultation  Referring Provider:     Crecencio Mc, MD Primary Care Physician:  Crecencio Mc, MD Primary Gastroenterologist:  Dr. Cephas Farley Reason for Consultation:     Elevated LFTs        HPI:   Mark Farley. is a 72 y.o. Caucasian male referred by Dr. Crecencio Mc, MD  for consultation & management of elevated LFTs.  Patient is first noted to have mildly elevated transaminases in 01/2017, AST 46, ALT 73.  His transaminases have been persistently elevated since 03/2018 about 2-3 times upper limit of normal.  Work-up thus far revealed elevated ferritin levels only.  Hep C antibody negative, he received Twinrix vaccine first dose.  ANA, anti-smooth muscle and antimitochondrial antibodies negative.  Patient was recommended to lose 10% of his body weight which he tried to but regained it back when he went to cruise.  He previously had low ferritin levels as well as anemia for which he underwent work-up which was negative for thalassemia.  TSH normal, hemoglobin A1c normal.  He underwent ultrasound abdomen which revealed fatty liver only He reports drinking 1-2 beers daily and sometimes wine for several years.  He does not smoke He denies using herbal supplements.  He is a English as a second language teacher.  He denies IV drug abuse, no history of tattoo or blood transfusions.  Hepatic Function Latest Ref Rng & Units 04/25/2018 04/04/2018 03/23/2018  Total Protein 6.0 - 8.3 g/dL 6.9 6.6 7.4  Albumin 3.5 - 5.2 g/dL 4.3 - 4.8  AST 0 - 37 U/L 51(H) 65(H) 58(H)  ALT 0 - 53 U/L 76(H) 92(H) 89(H)  Alk Phosphatase 39 - 117 U/L 58 - 59  Total Bilirubin 0.2 - 1.2 mg/dL 0.8 0.9 1.0  Bilirubin, Direct 0.0 - 0.3 mg/dL 0.1 0.2 0.2   NSAIDs: None  Antiplts/Anticoagulants/Anti thrombotics: None  GI Procedures: Colonoscopy in 12/2014 in [place], [place] Polyps were  detected  Past Medical History:  Diagnosis Date  . Arthritis    ? Gout  . Bilateral carpal tunnel syndrome    managed with nocturnal braces x 3 years  . COPD (chronic obstructive pulmonary disease) ([place])   . Glucose intolerance (impaired glucose tolerance)   . History of syncope 2005   Unexplained, myoview negative  . Hyperlipidemia   . Hypertension   . Iron deficiency anemia   . Osteoarthritis of multiple joints   . Palpitations 2010   Life Watch with single PVC    Past Surgical History:  Procedure Laterality Date  . COLONOSCOPY       Current Outpatient Medications:  .  diltiazem (CARDIZEM) 30 MG tablet, Take 1 tablet (30 mg total) by mouth 4 (four) times daily as needed., Disp: 90 tablet, Rfl: 3 .  fish oil-omega-3 fatty acids 1000 MG capsule, Take 1 capsule by mouth daily.  , Disp: , Rfl:  .  metoprolol succinate (TOPROL-XL) 50 MG 24 hr tablet, Take 1 tablet (50 mg total) by mouth daily., Disp: 90 tablet, Rfl: 3 .  Multiple Vitamin (MULTIVITAMIN) tablet, Take 1 tablet by mouth daily.  , Disp: , Rfl:  .  olmesartan (BENICAR) 20 MG tablet, Take 1 tablet (20 mg total) by mouth daily., Disp: 90 tablet, Rfl: 2 .  telmisartan-hydrochlorothiazide (MICARDIS HCT) 40-12.5 MG tablet, Take 1 tablet by mouth daily., Disp: 90 tablet, Rfl:  2 .  traMADol (ULTRAM) 50 MG tablet, Take 1 tablet (50 mg total) by mouth every 6 (six) hours as needed., Disp: 30 tablet, Rfl: 0 .  omeprazole (PRILOSEC) 20 MG capsule, Take 1 capsule (20 mg total) by mouth daily. (Patient not taking: Reported on 05/17/2018), Disp: 90 capsule, Rfl: 3 .  penciclovir (DENAVIR) 1 % cream, Apply 1 application topically every 2 (two) hours. As needed for blister (Patient not taking: Reported on 05/17/2018), Disp: 1.5 g, Rfl: 3 .  penciclovir (DENAVIR) 1 % cream, Apply 1 application topically every 2 (two) hours. (Patient not taking: Reported on 05/17/2018), Disp: 5 g, Rfl: 1  Family History  Problem Relation Age of Onset  .  Coronary artery disease Mother   . Heart attack Mother        MI  . Heart failure Father        CHF  . Colon cancer Brother   . Colon cancer Paternal Grandmother      Social History   Tobacco Use  . Smoking status: Never Smoker  . Smokeless tobacco: Never Used  Substance Use Topics  . Alcohol use: Yes    Alcohol/week: 14.0 standard drinks    Types: 14 Standard drinks or equivalent per week    Comment: 2 glasses of wine a day  . Drug use: No    Allergies as of 05/17/2018  . (No Known Allergies)    Review of Systems:    All systems reviewed and negative except where noted in HPI.   Physical Exam:  BP 134/79   Pulse 69   Ht 5' 7"  (1.702 m)   Wt 201 lb 3.2 oz (91.3 kg)   BMI 31.51 kg/m  No LMP for male patient.  General:   Alert,  Well-developed, well-nourished, pleasant and cooperative in NAD Head:  Normocephalic and atraumatic. Eyes:  Sclera clear, no icterus.   Conjunctiva pink. Ears:  Normal auditory acuity. Nose:  No deformity, discharge, or lesions. Mouth:  No deformity or lesions,oropharynx pink & moist. Neck:  Supple; no masses or thyromegaly. Lungs:  Respirations even and unlabored.  Clear throughout to auscultation.   No wheezes, crackles, or rhonchi. No acute distress. Heart:  Regular rate and rhythm; no murmurs, clicks, rubs, or gallops. Abdomen:  Normal bowel sounds. Soft, non-tender and non-distended without masses, hepatosplenomegaly or hernias noted.  No guarding or rebound tenderness.   Rectal: Not performed Msk:  Symmetrical without gross deformities. Good, equal movement & strength bilaterally. Pulses:  Normal pulses noted. Extremities:  No clubbing or edema.  No cyanosis. Neurologic:  Alert and oriented x3;  grossly normal neurologically. Skin:  Intact without significant lesions or rashes. No jaundice. Psych:  Alert and cooperative. Normal mood and affect.  Imaging Studies: Reviewed  Assessment and Plan:   Mark Farley. is a 72  y.o. Caucasian male with obesity, prediabetes, hypertension, chronic alcohol intake with elevated transaminases.  Work-up thus far revealed only elevated ferritin levels with normal percent iron saturation.  He does have fatty liver based on ultrasound.  With his chronic alcohol use, he probably has alcohol induced fatty liver and elevated ferritin levels are probably secondary to steatohepatitis.  Autoimmune work-up negative.  Recommend to complete rest of the secondary liver disease work-up  -Check mutations for hereditary hemochromatosis -Check alpha-1 antitrypsin, ceruloplasmin levels -Check PTH, tissue transglutaminase levels -Strongly advised patient to stop drinking alcohol in setting of elevated liver enzymes -Advised him to follow healthy diet and try to lose some  weight -Defer liver biopsy at this time -Complete vaccination against hepatitis A and B -Recheck LFTs in 3 months   Follow up in 3 months   Mark Darby, MD

## 2018-05-19 ENCOUNTER — Ambulatory Visit (INDEPENDENT_AMBULATORY_CARE_PROVIDER_SITE_OTHER): Payer: Medicare Other

## 2018-05-19 DIAGNOSIS — Z23 Encounter for immunization: Secondary | ICD-10-CM

## 2018-05-19 NOTE — Progress Notes (Signed)
Pt was seen for NV for Hep A and Hep B given IM in the RD. Pt tolerated well.

## 2018-05-20 LAB — PTH, INTACT AND CALCIUM
Calcium: 9 mg/dL (ref 8.6–10.2)
PTH: 38 pg/mL (ref 15–65)

## 2018-05-20 LAB — CERULOPLASMIN: Ceruloplasmin: 20.2 mg/dL (ref 16.0–31.0)

## 2018-05-20 LAB — IGA: IgA/Immunoglobulin A, Serum: 166 mg/dL (ref 61–437)

## 2018-05-20 LAB — TISSUE TRANSGLUTAMINASE, IGA: Transglutaminase IgA: <2 U/mL (ref 0–3)

## 2018-05-20 LAB — ALPHA-1-ANTITRYPSIN: A-1 Antitrypsin: 135 mg/dL (ref 101–187)

## 2018-05-20 LAB — HEMOCHROMATOSIS DNA-PCR(C282Y,H63D)

## 2018-05-30 ENCOUNTER — Other Ambulatory Visit: Payer: Medicare Other

## 2018-08-18 ENCOUNTER — Telehealth: Payer: Self-pay

## 2018-08-18 ENCOUNTER — Telehealth: Payer: Self-pay | Admitting: Gastroenterology

## 2018-08-18 ENCOUNTER — Telehealth: Payer: Self-pay | Admitting: Cardiovascular Disease

## 2018-08-18 NOTE — Telephone Encounter (Signed)
Pt c/o medication issue:  1. Name of Medication:  Epi Pen & Viagra  2. How are you currently taking this medication (dosage and times per day)? prn  3. Are you having a reaction (difficulty breathing--STAT)?  No   4. What is your medication issue? Patient wants to know if Epi pen  is safe for him to use and if he can have a paper rx for Viagra

## 2018-08-18 NOTE — Telephone Encounter (Signed)
08-18-18 @ 3:20pm l/m for pt appt was cancelled & needs to r/s in 2-3 months per DR Allegra Lai

## 2018-08-18 NOTE — Telephone Encounter (Signed)
Attempted to call patient. LMTCB 08/18/2018   

## 2018-08-18 NOTE — Telephone Encounter (Signed)
Call to patient to reschedule in regards to COVID-19 restrictions.  ?  Pt is comfortable with rescheduling at this time and denies any new, urgent or worsening sx.  ?  Message routed to cv div c19 pool for rescheduling.   COVID-19 Pre-Screening:  1. Have you been in contact with someone who was sick?  no 2. Do you have any of the following symptoms (cough, fever, muscle pain, vomiting, diarrhea, weakness abdominal pain, rash, red eye, bruising or bleeding, joint pain, severe headache)?  no 3. Have you travelled internationally or out of state in the last month?  no 4. Do you need any refills at this time?  no  Patient aware of the following: Please be advised that we require, no one but yourself to come to appointment. If necessary, only one visitor may come with you into the building. They will also be asked the same screening questions. You will be contacted at a later time to reschedule. However, this will depend on ongoing evaluation of the Covid-19 situation.  Please call us if any new questions or concerns arise. We are here for advice.  Routing to COVID cancel pool.

## 2018-08-19 ENCOUNTER — Telehealth: Payer: Self-pay

## 2018-08-19 ENCOUNTER — Other Ambulatory Visit: Payer: Self-pay

## 2018-08-19 DIAGNOSIS — R7303 Prediabetes: Secondary | ICD-10-CM

## 2018-08-19 DIAGNOSIS — E7800 Pure hypercholesterolemia, unspecified: Secondary | ICD-10-CM

## 2018-08-19 DIAGNOSIS — R748 Abnormal levels of other serum enzymes: Secondary | ICD-10-CM

## 2018-08-19 NOTE — Telephone Encounter (Signed)
Copied from CRM [id number]. Topic: Appointment Scheduling - Scheduling Inquiry for Clinic >> Aug 19, 2018  3:19 PM Arlyss Gandy, NT wrote: Reason for CRM: Pts wife states that his gastroenterologist is wanting him to have labs done with Dr. Darrick Huntsman to check his liver enzyme levels. Please call to schedule.

## 2018-08-19 NOTE — Telephone Encounter (Signed)
You put Level 3 in your message can you update in the computer

## 2018-08-22 ENCOUNTER — Ambulatory Visit: Payer: Medicare Other | Admitting: Cardiovascular Disease

## 2018-08-22 NOTE — Telephone Encounter (Signed)
Dr. Mariah Milling,  I don't see an issue with the Epi Pen if he needs it- thoughts?  He is also requesting a paper RX for viagra- if ok, please advise on dosing. He is on metoprolol.   Thanks!

## 2018-08-22 NOTE — Telephone Encounter (Signed)
Not aware of epi pen problem  OK to start viagra 50 to 100 mg pill as needed  Would write for sildenafil 20 TID PRN, #90, refill 3 Check Good [email or web address]/, would not run through insurance as he may not qualify for coverage and we do not do preauthorizations (Dr. Darrick Huntsman might be able to help him there is needed)

## 2018-08-23 ENCOUNTER — Ambulatory Visit: Payer: Medicare Other | Admitting: Gastroenterology

## 2018-08-23 MED ORDER — SILDENAFIL CITRATE 20 MG PO TABS: 20.0000 mg | ORAL_TABLET | Freq: Three times a day (TID) | ORAL | 3 refills | Status: DC | PRN

## 2018-08-23 NOTE — Telephone Encounter (Signed)
Patient returning call to check status of advice

## 2018-08-23 NOTE — Telephone Encounter (Signed)
Pt returned call. Per Carlyle Lipa is at lunch. Pt states that we can call back and his wife can speak to [place]. He is needing labs repeated to recheck liver enzymes that were elevated in November (per pt). He said GI is wanting this to be done at Dr. Melina Schools office.

## 2018-08-23 NOTE — Telephone Encounter (Signed)
Left voicemail message to call back  

## 2018-08-23 NOTE — Telephone Encounter (Signed)
LMTCB. Please transfer pt to our office.  

## 2018-08-23 NOTE — Telephone Encounter (Signed)
Spoke with patient and he is requesting written prescriptions in order to take to [facility] hospital. Advised that I would check with provider and would be in touch once they are ready.

## 2018-08-23 NOTE — Telephone Encounter (Signed)
Spoke with patient and reviewed that prescription would be up front for him to pick up and coupon for [employer] at $13.23 included as well. He verbalized understanding with no further questions at this time.

## 2018-08-30 NOTE — Telephone Encounter (Signed)
Spoke with pt and scheduled him a lab appt for 09/19/2018 at 8am. GI doctor wanted his liver enzymes rechecked so I ordered a CMP. Pt also would like to have his cholesterol checked because he has been off of his medication for a while, lipid panel ordered. Is there anything else that needs to ordered?

## 2018-08-30 NOTE — Addendum Note (Signed)
Addended by: Sherlene Shams on: 08/30/2018 09:18 AM   Modules accepted: Orders

## 2018-08-30 NOTE — Telephone Encounter (Signed)
Lab Results  Component Value Date   HGBA1C 5.9 03/10/2018   Thanks!  a1c added for prediabetes hx

## 2018-08-30 NOTE — Addendum Note (Signed)
Addended by: Sandy Salaam on: 08/30/2018 08:43 AM   Modules accepted: Orders

## 2018-09-01 NOTE — Telephone Encounter (Signed)
Called to schedule evisit  No answer, No VM

## 2018-09-01 NOTE — Telephone Encounter (Signed)
Spoke with patient. He is agreeable for virtual visit with Dr. Mariah Milling. I will route to scheduling and take out of the COVID-19 pool.

## 2018-09-19 ENCOUNTER — Other Ambulatory Visit: Payer: Self-pay

## 2018-09-19 ENCOUNTER — Other Ambulatory Visit (INDEPENDENT_AMBULATORY_CARE_PROVIDER_SITE_OTHER): Payer: Medicare Other

## 2018-09-19 DIAGNOSIS — R748 Abnormal levels of other serum enzymes: Secondary | ICD-10-CM

## 2018-09-19 DIAGNOSIS — R7303 Prediabetes: Secondary | ICD-10-CM

## 2018-09-19 DIAGNOSIS — K760 Fatty (change of) liver, not elsewhere classified: Secondary | ICD-10-CM

## 2018-09-19 DIAGNOSIS — E7800 Pure hypercholesterolemia, unspecified: Secondary | ICD-10-CM | POA: Diagnosis not present

## 2018-09-19 LAB — COMPREHENSIVE METABOLIC PANEL WITH GFR
ALT: 36 U/L (ref 0–53)
AST: 24 U/L (ref 0–37)
Albumin: 4.4 g/dL (ref 3.5–5.2)
Alkaline Phosphatase: 62 U/L (ref 39–117)
BUN: 17 mg/dL (ref 6–23)
CO2: 31 meq/L (ref 19–32)
Calcium: 9.7 mg/dL (ref 8.4–10.5)
Chloride: 96 meq/L (ref 96–112)
Creatinine, Ser: 0.7 mg/dL (ref 0.40–1.50)
GFR: 110.53 mL/min
Glucose, Bld: 104 mg/dL — ABNORMAL HIGH (ref 70–99)
Potassium: 4.7 meq/L (ref 3.5–5.1)
Sodium: 134 meq/L — ABNORMAL LOW (ref 135–145)
Total Bilirubin: 1.3 mg/dL — ABNORMAL HIGH (ref 0.2–1.2)
Total Protein: 6.8 g/dL (ref 6.0–8.3)

## 2018-09-19 LAB — HEPATIC FUNCTION PANEL
ALT: 36 U/L (ref 0–53)
AST: 24 U/L (ref 0–37)
Albumin: 4.4 g/dL (ref 3.5–5.2)
Alkaline Phosphatase: 62 U/L (ref 39–117)
Bilirubin, Direct: 0.2 mg/dL (ref 0.0–0.3)
Total Bilirubin: 1.3 mg/dL — ABNORMAL HIGH (ref 0.2–1.2)
Total Protein: 6.8 g/dL (ref 6.0–8.3)

## 2018-09-19 LAB — LIPID PANEL
Cholesterol: 199 mg/dL (ref 0–200)
HDL: 44.5 mg/dL
LDL Cholesterol: 130 mg/dL — ABNORMAL HIGH (ref 0–99)
NonHDL: 154.4
Total CHOL/HDL Ratio: 4
Triglycerides: 121 mg/dL (ref 0.0–149.0)
VLDL: 24.2 mg/dL (ref 0.0–40.0)

## 2018-09-19 LAB — HEMOGLOBIN A1C: Hgb A1c MFr Bld: 6 % (ref 4.6–6.5)

## 2018-09-19 NOTE — Addendum Note (Signed)
Addended by: Warden Fillers on: 09/19/2018 08:03 AM   Modules accepted: Orders

## 2018-09-20 LAB — IRON, TOTAL/TOTAL IRON BINDING CAP
%SAT: 25 % (calc) (ref 20–48)
TIBC: 339 mcg/dL (calc) (ref 250–425)

## 2018-09-20 LAB — IRON,?TOTAL/TOTAL IRON BINDING CAP: Iron: 84 ug/dL (ref 50–180)

## 2018-09-22 ENCOUNTER — Telehealth: Payer: Self-pay | Admitting: Cardiovascular Disease

## 2018-09-22 ENCOUNTER — Other Ambulatory Visit: Payer: Self-pay

## 2018-09-22 ENCOUNTER — Ambulatory Visit (INDEPENDENT_AMBULATORY_CARE_PROVIDER_SITE_OTHER): Payer: Medicare Other | Admitting: Internal Medicine

## 2018-09-22 DIAGNOSIS — E663 Overweight: Secondary | ICD-10-CM

## 2018-09-22 DIAGNOSIS — N529 Male erectile dysfunction, unspecified: Secondary | ICD-10-CM | POA: Diagnosis not present

## 2018-09-22 DIAGNOSIS — K760 Fatty (change of) liver, not elsewhere classified: Secondary | ICD-10-CM

## 2018-09-22 DIAGNOSIS — R7303 Prediabetes: Secondary | ICD-10-CM | POA: Diagnosis not present

## 2018-09-22 DIAGNOSIS — E7800 Pure hypercholesterolemia, unspecified: Secondary | ICD-10-CM

## 2018-09-22 DIAGNOSIS — Z7189 Other specified counseling: Secondary | ICD-10-CM

## 2018-09-22 NOTE — Progress Notes (Signed)
Virtual Visit via [email or web address]  This visit type was conducted due to national recommendations for restrictions regarding the COVID-19 pandemic (e.g. social distancing).  This format is felt to be most appropriate for this patient at this time.  All issues noted in this document were discussed and addressed.  No physical exam was performed (except for noted visual exam findings with Video Visits).   I connected with@ on 09/25/18 at  8:00 AM EDT by a video enabled telemedicine application and verified that I am speaking with the correct person using two identifiers. Location patient: home Location provider: work  Persons participating in the virtual visit: patient, provider  I discussed the limitations, risks, security and privacy concerns of performing an evaluation and management service by telephone and the availability of in person appointments. I also discussed with the patient that there may be a patient responsible charge related to this service. The patient expressed understanding and agreed to proceed.   Reason for visit: 6 month follow up   HPI:  73 yr old male with hypertension, hyperlipidemia and prediabetes presents via virtual visit for follow up.  Hypertension: patient checks blood pressure twice weekly at home.  Readings have been for the most part <130/80 at rest . Patient is following a reduce salt diet most days and is taking medications as prescribed. He Is exercising regularly, and has had an intentional weight loss of 24 lbs since his last visit in response to the diagnosis of "prediabetes."   The patient has no signs or symptoms of COVID 19 infection (fever, cough, sore throat  or shortness of breath beyond what is typical for patient).  Patient denies contact with other persons with the above mentioned symptoms or with anyone confirmed to have COVID 19 .  He is sheltering at home and wearing a mask in public.   Recent Labs reviewed with patient today:  LDL increased from 69  to 409130 on diet alone (crestor stopped last year)  Iron  Studies normal Fatty liver by US:  cmet normal except for stable T Bili at  1.3 a1c  Slight increase from 5.9 to 6.0  His leg cramps have stopped with the nightly one ounce serving of pickle juice   He is having increased joint pain without the celebrex and would like to resume it once daily   Discussed resuming crestor ,  celebrex and viagra (written by Gollan)     ROS: See pertinent positives and negatives per HPI.  Past Medical History:  Diagnosis Date  . Arthritis    ? Gout  . Bilateral carpal tunnel syndrome    managed with nocturnal braces x 3 years  . COPD (chronic obstructive pulmonary disease) (HCC)   . Glucose intolerance (impaired glucose tolerance)   . History of syncope 2005   Unexplained, myoview negative  . Hyperlipidemia   . Hypertension   . Iron deficiency anemia   . Osteoarthritis of multiple joints   . Palpitations 2010   Life Watch with single PVC    Past Surgical History:  Procedure Laterality Date  . COLONOSCOPY      Family History  Problem Relation Age of Onset  . Coronary artery disease Mother   . Heart attack Mother        MI  . Heart failure Father        CHF  . Colon cancer Brother   . Colon cancer Paternal Grandmother     SOCIAL HX: married ,  Retired,  Enjoys gardening  Current Outpatient Medications:  .  diltiazem (CARDIZEM) 30 MG tablet, Take 1 tablet (30 mg total) by mouth 4 (four) times daily as needed., Disp: 90 tablet, Rfl: 3 .  metoprolol succinate (TOPROL-XL) 50 MG 24 hr tablet, Take 1 tablet (50 mg total) by mouth daily., Disp: 90 tablet, Rfl: 3 .  omeprazole (PRILOSEC) 20 MG capsule, Take 1 capsule (20 mg total) by mouth daily., Disp: 90 capsule, Rfl: 3 .  telmisartan-hydrochlorothiazide (MICARDIS HCT) 40-12.5 MG tablet, Take 1 tablet by mouth daily., Disp: 90 tablet, Rfl: 2 .  traMADol (ULTRAM) 50 MG tablet, Take 1 tablet (50 mg total) by mouth every 6 (six)  hours as needed., Disp: 30 tablet, Rfl: 0  EXAM:  VITALS per patient if applicable:  GENERAL: alert, oriented, appears well and in no acute distress  HEENT: atraumatic, conjunttiva clear, no obvious abnormalities on inspection of external nose and ears  NECK: normal movements of the head and neck  LUNGS: on inspection no signs of respiratory distress, breathing rate appears normal, no obvious gross SOB, gasping or wheezing  CV: no obvious cyanosis  MS: moves all visible extremities without noticeable abnormality  PSYCH/NEURO: pleasant and cooperative, no obvious depression or anxiety, speech and thought processing grossly intact  ASSESSMENT AND PLAN:  Discussed the following assessment and plan:  Educated About Covid-19 Virus Infection  Hepatic steatosis  Prediabetes  Erectile dysfunction, unspecified erectile dysfunction type  Pure hypercholesterolemia  Overweight (BMI 25.0-29.9)  Educated About Covid-19 Virus Infection Educated patient on the signs and symptoms of COVID-19 infection and ways to avoid the viral infection including washing hands frequently with soap and water,  using hand sanitizer if unable to wash, avoiding touching face,  staying at home and limiting visitors,  and avoiding contact with people coming in and out of home.  Reminded patient to call office with questions/concerns.  The importance of continued social distancing was discussed today  Hepatic steatosis Previously seen ALT elevation is now resolved.  Serologies for iron overload and other causes were negative.  Ultrasound suggestive of hepatic steatosis.  He is managing his prediabetes and has los 12% of his body weight since last visit and will resume statin therapy  Lab Results  Component Value Date   ALT 36 09/19/2018   ALT 36 09/19/2018   AST 24 09/19/2018   AST 24 09/19/2018   ALKPHOS 62 09/19/2018   ALKPHOS 62 09/19/2018   BILITOT 1.3 (H) 09/19/2018   BILITOT 1.3 (H) 09/19/2018      Prediabetes Managed with low GI diet, weight loss and exercise.  a1c has not improved.  He is not taking a statin     Lab Results  Component Value Date   HGBA1C 6.0 09/19/2018     Erectile dysfunction He is requesting a trial of Viagra.  20 mg dose prescribed for cost savings with instructions to start with 2 tablets and and a 3rd if needed   Hyperlipidemia His risk of CAD is  33% using the FRC .  He is willing to resume Crestor  Lab Results  Component Value Date   CHOL 199 09/19/2018   HDL 44.50 09/19/2018   LDLCALC 130 (H) 09/19/2018   LDLDIRECT 81.0 03/08/2017   TRIG 121.0 09/19/2018   CHOLHDL 4 09/19/2018     Overweight (BMI 25.0-29.9) I have congratulated him  in reduction of   BMI and encouraged  Continued attention to diet and exercise     I discussed the assessment and treatment plan with  the patient. The patient was provided an opportunity to ask questions and all were answered. The patient agreed with the plan and demonstrated an understanding of the instructions.   The patient was advised to call back or seek an in-person evaluation if the symptoms worsen or if the condition fails to improve as anticipated.  I provided 25 minutes of non-face-to-face time during this encounter.   Crecencio Mc, MD

## 2018-09-22 NOTE — Telephone Encounter (Signed)
Spoke with patient to review scheduling his follow up appointment with provider. Offered him virtual visit by video or telephone. Patient was agreeable with video visit and reviewed consent with him. Confirmed appointment information, verbal consent to proceed, and had no further questions at this time.   YOUR CARDIOLOGY TEAM HAS ARRANGED FOR AN E-VISIT FOR YOUR APPOINTMENT - PLEASE REVIEW IMPORTANT INFORMATION BELOW SEVERAL DAYS PRIOR TO YOUR APPOINTMENT  Due to the recent COVID-19 pandemic, we are transitioning in-person office visits to tele-medicine visits in an effort to decrease unnecessary exposure to our patients, their families, and staff. These visits are billed to your insurance just like a normal visit is. We also encourage you to sign up for MyChart if you have not already done so. You will need a smartphone if possible. For patients that do not have this, we can still complete the visit using a regular telephone but do prefer a smartphone to enable video when possible. You may have a family member that lives with you that can help. If possible, we also ask that you have a blood pressure cuff and scale at home to measure your blood pressure, heart rate and weight prior to your scheduled appointment. Patients with clinical needs that need an in-person evaluation and testing will still be able to come to the office if absolutely necessary. If you have any questions, feel free to call our office.   2-3 DAYS BEFORE YOUR APPOINTMENT  You will receive a telephone call from one of our [facility] team members - your caller ID may say "Unknown caller." If this is a video visit, we will walk you through how to get the video launched on your phone. We will remind you check your blood pressure, heart rate and weight prior to your scheduled appointment. If you have an Apple Watch or Kardia, please upload any pertinent ECG strips the day before or morning of your appointment to MyChart. Our staff will also  make sure you have reviewed the consent and agree to move forward with your scheduled tele-health visit.     THE DAY OF YOUR APPOINTMENT  Approximately 15 minutes prior to your scheduled appointment, you will receive a telephone call from one of [facility] team - your caller ID may say "Unknown caller."  Our staff will confirm medications, vital signs for the day and any symptoms you may be experiencing. Please have this information available prior to the time of visit start. It may also be helpful for you to have a pad of paper and pen handy for any instructions given during your visit. They will also walk you through joining the smartphone meeting if this is a video visit.    CONSENT FOR TELE-HEALTH VISIT - PLEASE REVIEW  I hereby voluntarily request, consent and authorize [facility] and its employed or contracted physicians, physician assistants, nurse practitioners or other licensed health care professionals (the Practitioner), to provide me with telemedicine health care services (the "Services") as deemed necessary by the treating Practitioner. I acknowledge and consent to receive the Services by the Practitioner via telemedicine. I understand that the telemedicine visit will involve communicating with the Practitioner through live audiovisual communication technology and the disclosure of certain medical information by electronic transmission. I acknowledge that I have been given the opportunity to request an in-person assessment or other available alternative prior to the telemedicine visit and am voluntarily participating in the telemedicine visit.  I understand that I have the right to withhold or withdraw my consent to  the use of telemedicine in the course of my care at any time, without affecting my right to future care or treatment, and that the Practitioner or I may terminate the telemedicine visit at any time. I understand that I have the right to inspect all information obtained  and/or recorded in the course of the telemedicine visit and may receive copies of available information for a reasonable fee.  I understand that some of the potential risks of receiving the Services via telemedicine include:  Marland Kitchen Delay or interruption in medical evaluation due to technological equipment failure or disruption; . Information transmitted may not be sufficient (e.g. poor resolution of images) to allow for appropriate medical decision making by the Practitioner; and/or  . In rare instances, security protocols could fail, causing a breach of personal health information.  Furthermore, I acknowledge that it is my responsibility to provide information about my medical history, conditions and care that is complete and accurate to the best of my ability. I acknowledge that Practitioner's advice, recommendations, and/or decision may be based on factors not within their control, such as incomplete or inaccurate data provided by me or distortions of diagnostic images or specimens that may result from electronic transmissions. I understand that the practice of medicine is not an exact science and that Practitioner makes no warranties or guarantees regarding treatment outcomes. I acknowledge that I will receive a copy of this consent concurrently upon execution via email to the email address I last provided but may also request a printed copy by calling the office of [facility].    I understand that my insurance will be billed for this visit.   I have read or had this consent read to me. . I understand the contents of this consent, which adequately explains the benefits and risks of the Services being provided via telemedicine.  . I have been provided ample opportunity to ask questions regarding this consent and the Services and have had my questions answered to my satisfaction. . I give my informed consent for the services to be provided through the use of telemedicine in my medical care  By  participating in this telemedicine visit I agree to the above.

## 2018-09-25 ENCOUNTER — Encounter: Payer: Self-pay | Admitting: Internal Medicine

## 2018-09-25 DIAGNOSIS — Z7189 Other specified counseling: Secondary | ICD-10-CM | POA: Insufficient documentation

## 2018-09-25 DIAGNOSIS — N529 Male erectile dysfunction, unspecified: Secondary | ICD-10-CM | POA: Insufficient documentation

## 2018-09-25 NOTE — Assessment & Plan Note (Signed)
Educated patient on the signs and symptoms of COVID-19 infection and ways to avoid the viral infection including washing hands frequently with soap and water,  using hand sanitizer if unable to wash, avoiding touching face,  staying at home and limiting visitors,  and avoiding contact with people coming in and out of home.  Reminded patient to call office with questions/concerns.  The importance of continued social distancing was discussed today 

## 2018-09-25 NOTE — Assessment & Plan Note (Signed)
I have congratulated him  in reduction of   BMI and encouraged  Continued attention to diet and exercise

## 2018-09-25 NOTE — Assessment & Plan Note (Signed)
His risk of CAD is  33% using the FRC .  He is willing to resume Crestor  Lab Results  Component Value Date   CHOL 199 09/19/2018   HDL 44.50 09/19/2018   LDLCALC 130 (H) 09/19/2018   LDLDIRECT 81.0 03/08/2017   TRIG 121.0 09/19/2018   CHOLHDL 4 09/19/2018

## 2018-09-25 NOTE — Assessment & Plan Note (Signed)
Managed with low GI diet, weight loss and exercise.  a1c has not improved.  He is not taking a statin     Lab Results  Component Value Date   HGBA1C 6.0 09/19/2018

## 2018-09-25 NOTE — Assessment & Plan Note (Addendum)
Previously seen ALT elevation is now resolved.  Serologies for iron overload and other causes were negative.  Ultrasound suggestive of hepatic steatosis.  He is managing his prediabetes and has los 12% of his body weight since last visit and will resume statin therapy  Lab Results  Component Value Date   ALT 36 09/19/2018   ALT 36 09/19/2018   AST 24 09/19/2018   AST 24 09/19/2018   ALKPHOS 62 09/19/2018   ALKPHOS 62 09/19/2018   BILITOT 1.3 (H) 09/19/2018   BILITOT 1.3 (H) 09/19/2018

## 2018-09-25 NOTE — Assessment & Plan Note (Signed)
He is requesting a trial of Viagra.  20 mg dose prescribed for cost savings with instructions to start with 2 tablets and and a 3rd if needed

## 2018-09-28 NOTE — Progress Notes (Signed)
Virtual Visit via Video Note   This visit type was conducted due to national recommendations for restrictions regarding the COVID-19 Pandemic (e.g. social distancing) in an effort to limit this patient's exposure and mitigate transmission in our community.  Due to his co-morbid illnesses, this patient is at least at moderate risk for complications without adequate follow up.  This format is felt to be most appropriate for this patient at this time.  All issues noted in this document were discussed and addressed.  A limited physical exam was performed with this format.  Please refer to the patient's chart for his consent to telehealth for [facility].   I connected with  Mark PillowAnthony J Janota Jr. on 09/29/18 by a video enabled telemedicine application and verified that I am speaking with the correct person using two identifiers. I discussed the limitations of evaluation and management by telemedicine. The patient expressed understanding and agreed to proceed.   Evaluation Performed:  Follow-up visit  Date:  09/29/2018   ID:  Mark PillowAnthony J Hair Jr., DOB 03/19/1946, MRN [id number]  Patient Location:  [address] [place] [id number]   [facility] location[facility]   [facility] [place] [facility]  PCP:  Sherlene Shamsullo, Teresa L, MD  Cardiologist:  Fonnie MuGollan, [facility]   Chief Complaint:  Elevated LFTS, weight loss   History of Present Illness:    Mark Pillownthony J Corsello Jr. is a 73 y.o. male who presents via audio/video conferencing for a telehealth visit today.   The patient does not symptoms concerning for COVID-19 infection (fever, chills, cough, or new SHORTNESS OF BREATH).   Patient has a past medical history of HTN,  HL   previous syncope with negative Myoview in 2005.  h/o palpitations and had a Life Watch monitor for several weeks but no events. Monitor only caught a single PVC.  arthritis,  anemia from iron deficiency Previous Holter monitor showing runs of SVT, rate up  to 150 bpm He presents today for follow-up of his palpitations, hypertension  Elevated LFTS on crestor 10 Stopped, and LFTS improved Seen by Dr. Allegra LaiVanga  weight 201,  Now 172 Changed diet,   Total chol 190 LDL 130 HBA1C 6.0  Has surgery on knee Has arthritis  Rare cramps, takes pickle juice He does have regular exercise program  No significant tachycardia No sob, chest pain  Was "retaining fluid" Changed to ARB-HCTZ  Has not required diltiazem 30 mg pills for tachycardia   severe series of tachycardia episodes day after Thanksgiving 2016 Reported he was driving in a car back from a trip, tachycardia seem to happen every 5 minutes. He took several diltiazem, possibly up to 3 or 4 and eventually symptoms seem to improve.    Prior CV studies:   The following studies were reviewed today:  Holter monitor showing frequent episodes of SVT, short-lived total cholesterol less than 150   Past Medical History:  Diagnosis Date  . Arthritis    ? Gout  . Bilateral carpal tunnel syndrome    managed with nocturnal braces x 3 years  . COPD (chronic obstructive pulmonary disease) (HCC)   . Glucose intolerance (impaired glucose tolerance)   . History of syncope 2005   Unexplained, myoview negative  . Hyperlipidemia   . Hypertension   . Iron deficiency anemia   . Osteoarthritis of multiple joints   . Palpitations 2010   Life Watch with single PVC   Past Surgical History:  Procedure Laterality Date  . COLONOSCOPY  Current Meds  Medication Sig  . diltiazem (CARDIZEM) 30 MG tablet Take 1 tablet (30 mg total) by mouth 4 (four) times daily as needed.  . metoprolol succinate (TOPROL-XL) 50 MG 24 hr tablet Take 1 tablet (50 mg total) by mouth daily.  Marland Kitchen omeprazole (PRILOSEC) 20 MG capsule Take 1 capsule (20 mg total) by mouth daily.  Marland Kitchen telmisartan-hydrochlorothiazide (MICARDIS HCT) 40-12.5 MG tablet Take 1 tablet by mouth daily.  . traMADol (ULTRAM) 50 MG tablet Take 1  tablet (50 mg total) by mouth every 6 (six) hours as needed.     Allergies:   Patient has no known allergies.   Social History   Tobacco Use  . Smoking status: Never Smoker  . Smokeless tobacco: Never Used  Substance Use Topics  . Alcohol use: Yes    Alcohol/week: 14.0 standard drinks    Types: 14 Standard drinks or equivalent per week    Comment: 2 glasses of wine a day  . Drug use: No     Current Outpatient Medications on File Prior to Visit  Medication Sig Dispense Refill  . diltiazem (CARDIZEM) 30 MG tablet Take 1 tablet (30 mg total) by mouth 4 (four) times daily as needed. 90 tablet 3  . metoprolol succinate (TOPROL-XL) 50 MG 24 hr tablet Take 1 tablet (50 mg total) by mouth daily. 90 tablet 3  . omeprazole (PRILOSEC) 20 MG capsule Take 1 capsule (20 mg total) by mouth daily. 90 capsule 3  . telmisartan-hydrochlorothiazide (MICARDIS HCT) 40-12.5 MG tablet Take 1 tablet by mouth daily. 90 tablet 2  . traMADol (ULTRAM) 50 MG tablet Take 1 tablet (50 mg total) by mouth every 6 (six) hours as needed. 30 tablet 0   No current facility-administered medications on file prior to visit.      Family Hx: The patient's family history includes Colon cancer in his brother and paternal grandmother; Coronary artery disease in his mother; Heart attack in his mother; Heart failure in his father.  ROS:   Please see the history of present illness.    Review of Systems  Constitutional: Positive for weight loss.  Respiratory: Negative.   Cardiovascular: Negative.   Gastrointestinal: Negative.   Musculoskeletal: Positive for joint pain.  Neurological: Negative.   Psychiatric/Behavioral: Negative.   All other systems reviewed and are negative.     Labs/Other Tests and Data Reviewed:    Recent Labs: 03/10/2018: Hemoglobin 14.8; Platelets 169.0; TSH 0.90 09/19/2018: ALT 36; ALT 36; BUN 17; Creatinine, Ser 0.70; Potassium 4.7; Sodium 134   Recent Lipid Panel Lab Results  Component  Value Date/Time   CHOL 199 09/19/2018 08:03 AM   TRIG 121.0 09/19/2018 08:03 AM   TRIG 108 05/01/2009 12:00 AM   HDL 44.50 09/19/2018 08:03 AM   CHOLHDL 4 09/19/2018 08:03 AM   LDLCALC 130 (H) 09/19/2018 08:03 AM   LDLDIRECT 81.0 03/08/2017 10:06 AM    Wt Readings from Last 3 Encounters:  09/25/18 177 lb (80.3 kg)  05/17/18 201 lb 3.2 oz (91.3 kg)  04/27/18 201 lb (91.2 kg)     Exam:    Vital Signs: Vital signs may also be detailed in the HPI There were no vitals taken for this visit.  Wt Readings from Last 3 Encounters:  09/25/18 177 lb (80.3 kg)  05/17/18 201 lb 3.2 oz (91.3 kg)  04/27/18 201 lb (91.2 kg)   Temp Readings from Last 3 Encounters:  03/23/18 98.2 F (36.8 C) (Oral)  03/10/18 98.2 F (36.8 C) (Oral)  03/08/17 98.2 F (36.8 C) (Oral)   BP Readings from Last 3 Encounters:  09/25/18 127/74  05/17/18 134/79  03/23/18 114/74   Pulse Readings from Last 3 Encounters:  09/25/18 72  05/17/18 69  03/23/18 73    125/74 Pulse 70 resp 16  Well nourished, well developed male in no acute distress. Constitutional:  oriented to person, place, and time. No distress.  Head: Normocephalic and atraumatic.  Eyes:  no discharge. No scleral icterus.  Neck: Normal range of motion. Neck supple.  Pulmonary/Chest: No audible wheezing, no distress, appears comfortable Musculoskeletal: Normal range of motion.  no  tenderness or deformity.  Neurological:   Coordination normal. Full exam not performed Skin:  No rash Psychiatric:  normal mood and affect. behavior is normal. Thought content normal.    ASSESSMENT & PLAN:    SVT (supraventricular tachycardia) (HCC) Denies having significant arrhythmia He has not had to take diltiazem 30 mg pills  HYPERTENSION, BENIGN Blood pressure stable, especially after 30 pound weight loss Suggested he check orthostatics periodically especially during the summertime  PVC's (premature ventricular contractions) Denies having ectopy,  is not taking diltiazem  Mixed hyperlipidemia Long discussion concerning recent transaminitis Unable to exclude Crestor Suspect secondary to fatty liver, recent 30 pound weight loss and he held Crestor Risk stratification studies discussed with him in detail He is interested in CT coronary calcium scoring for guidance Depending on the score, this could help determine whether we should restart lower dose statin trial If score is very low, he might be able to manage numbers with aggressive diet as he is done  History of syncope No further episodes of near syncope or syncope  COVID-19 Education: The signs and symptoms of COVID-19 were discussed with the patient and how to seek care for testing (follow up with PCP or arrange E-visit).  The importance of social distancing was discussed today.  Patient Risk:   After full review of this patients clinical status, I feel that they are at least moderate risk at this time.  Time:   Today, I have spent 25 minutes with the patient with telehealth technology discussing the cardiac and medical problems/diagnoses detailed above   10 min spent reviewing the chart prior to patient visit today   Medication Adjustments/Labs and Tests Ordered: Current medicines are reviewed at length with the patient today.  Concerns regarding medicines are outlined above.   Tests Ordered: No tests ordered   Medication Changes: No changes made   Disposition: Follow-up in 12 months   Signed, Julien Nordmann, MD  09/29/2018 9:05 AM    [facility] [address], [place], [place] [address]

## 2018-09-29 ENCOUNTER — Telehealth (INDEPENDENT_AMBULATORY_CARE_PROVIDER_SITE_OTHER): Payer: Medicare Other | Admitting: Cardiovascular Disease

## 2018-09-29 ENCOUNTER — Other Ambulatory Visit: Payer: Self-pay

## 2018-09-29 DIAGNOSIS — I471 Supraventricular tachycardia: Secondary | ICD-10-CM

## 2018-09-29 DIAGNOSIS — R740 Nonspecific elevation of levels of transaminase and lactic acid dehydrogenase [LDH]: Secondary | ICD-10-CM

## 2018-09-29 DIAGNOSIS — E782 Mixed hyperlipidemia: Secondary | ICD-10-CM | POA: Diagnosis not present

## 2018-09-29 DIAGNOSIS — Z87898 Personal history of other specified conditions: Secondary | ICD-10-CM

## 2018-09-29 DIAGNOSIS — I10 Essential (primary) hypertension: Secondary | ICD-10-CM

## 2018-09-29 DIAGNOSIS — R7401 Elevation of levels of liver transaminase levels: Secondary | ICD-10-CM

## 2018-09-29 DIAGNOSIS — I493 Ventricular premature depolarization: Secondary | ICD-10-CM

## 2018-09-29 NOTE — Patient Instructions (Signed)
We will schedule a CT coronary calcium score Family hx, patients has hyperlipidemia, risk stratification  Medication Instructions:  No changes  If you need a refill on your cardiac medications before your next appointment, please call your pharmacy.    Lab work: No new labs needed   If you have labs (blood work) drawn today and your tests are completely normal, you will receive your results only by: Marland Kitchen MyChart Message (if you have MyChart) OR . A paper copy in the mail If you have any lab test that is abnormal or we need to change your treatment, we will call you to review the results.   Testing/Procedures: No new testing needed   Follow-Up: At [facility], you and your health needs are our priority.  As part of our continuing mission to provide you with exceptional heart care, we have created designated Provider Care Teams.  These Care Teams include your primary Cardiologist (physician) and Advanced Practice Providers (APPs -  Physician Assistants and Nurse Practitioners) who all work together to provide you with the care you need, when you need it.  . You will need a follow up appointment in 12 months .   Please call our office 2 months in advance to schedule this appointment.    . Providers on your designated Care Team:   . Nicolasa Ducking, NP . Eula Listen, PA-C . Marisue Ivan, PA-C  Any Other Special Instructions Will Be Listed Below (If Applicable).  For educational health videos Log in to : [email or web address] Or : [email or web address], password : [email or web address]

## 2018-10-03 ENCOUNTER — Ambulatory Visit: Payer: Medicare Other | Admitting: Gastroenterology

## 2018-10-18 ENCOUNTER — Ambulatory Visit (INDEPENDENT_AMBULATORY_CARE_PROVIDER_SITE_OTHER): Payer: Medicare Other

## 2018-10-18 ENCOUNTER — Other Ambulatory Visit: Payer: Self-pay

## 2018-10-18 DIAGNOSIS — Z23 Encounter for immunization: Secondary | ICD-10-CM

## 2018-12-05 ENCOUNTER — Telehealth: Payer: Self-pay | Admitting: *Deleted

## 2018-12-05 NOTE — Telephone Encounter (Signed)
Script Screening patients for COVID-19 and reviewing new operational procedures  Greeting - The reason I am calling is to share with you some new changes to our processes that are designed to help us keep everyone safe. Is now a good time to speak with you? Patient says "no' - ask them when you can call back and let them know it's important to do this prior to their appointment.  Patient says "yes" - Great, Mark Farley the first thing I need to do is ask you some screening Questions.  1. To the best of your knowledge, have you been in close contact with any one with a confirmed diagnosis of COVID 19? o No - proceed to next question  2. Have you had any one or more of the following: fever, chills, cough, shortness of breath or any flu-like symptoms? o No - proceed to next question  3. Have you been diagnosed with or have a previous diagnosis of COVID 19? o No - proceed to next question  4. I am going to go over a few other symptoms with you. Please let me know if you are experiencing any of the following: . Ear, nose or throat discomfort . A sore throat . Headache . Muscle pain . Diarrhea . Loss of taste or smell o No - proceed to next question  Thank you for answering these questions. Please know we will ask you these questions or similar questions when you arrive for your appointment and again it's how we are keeping everyone safe. Also, to keep you safe, please use the provided hand sanitizer when you enter the building. Mark Farley, we are asking everyone in the building to wear a mask because they help us prevent the spread of germs. Do you have a mask of your own, if not, we are happy to provide one for you. The last thing I want to go over with you is the no visitor guidelines. This means no one can attend the appointment with you unless you need physical assistance. I understand this may be different from your past appointments and I know this may be difficult but please know if  someone is driving you we are happy to call them for you once your appointment is over.  [INSERT SITE SPECIFIC CHECK IN PROCEDURES]  Mark Farley I've given you a lot of information, what questions do you have about what I've talked about today or your appointment tomorrow?  

## 2018-12-06 ENCOUNTER — Other Ambulatory Visit: Payer: Self-pay

## 2018-12-06 ENCOUNTER — Ambulatory Visit (INDEPENDENT_AMBULATORY_CARE_PROVIDER_SITE_OTHER)
Admission: RE | Admit: 2018-12-06 | Discharge: 2018-12-06 | Disposition: A | Discharge status: Home / Self Care | Payer: Medicare Other | Source: Ambulatory Visit | Attending: Cardiovascular Disease | Admitting: Cardiovascular Disease

## 2018-12-06 DIAGNOSIS — E782 Mixed hyperlipidemia: Secondary | ICD-10-CM

## 2018-12-07 ENCOUNTER — Ambulatory Visit (INDEPENDENT_AMBULATORY_CARE_PROVIDER_SITE_OTHER): Payer: Medicare Other | Admitting: Orthopedic Surgery

## 2018-12-07 ENCOUNTER — Other Ambulatory Visit: Payer: Self-pay

## 2018-12-07 ENCOUNTER — Encounter: Payer: Self-pay | Admitting: Orthopedic Surgery

## 2018-12-07 DIAGNOSIS — M170 Bilateral primary osteoarthritis of knee: Secondary | ICD-10-CM

## 2018-12-08 ENCOUNTER — Telehealth: Payer: Self-pay | Admitting: Orthopedic Surgery

## 2018-12-08 ENCOUNTER — Encounter: Payer: Self-pay | Admitting: Orthopedic Surgery

## 2018-12-08 ENCOUNTER — Ambulatory Visit: Payer: Medicare Other | Admitting: Gastroenterology

## 2018-12-08 DIAGNOSIS — M170 Bilateral primary osteoarthritis of knee: Secondary | ICD-10-CM

## 2018-12-08 MED ORDER — METHYLPREDNISOLONE ACETATE 40 MG/ML IJ SUSP
40.0000 mg | INTRAMUSCULAR | Status: AC | PRN
  Administered 2018-12-08: 07:00:00 40 mg via INTRA_ARTICULAR

## 2018-12-08 MED ORDER — BUPIVACAINE HCL 0.25 % IJ SOLN
4.0000 mL | INTRAMUSCULAR | Status: AC | PRN
  Administered 2018-12-08: 07:00:00 4 mL via INTRA_ARTICULAR

## 2018-12-08 MED ORDER — LIDOCAINE HCL 1 % IJ SOLN
5.0000 mL | INTRAMUSCULAR | Status: AC | PRN
  Administered 2018-12-08: 07:00:00 5 mL

## 2018-12-08 MED ORDER — LIDOCAINE HCL 1 % IJ SOLN
5.0000 mL | INTRAMUSCULAR | Status: AC | PRN
  Administered 2018-12-08: 5 mL

## 2018-12-08 NOTE — Telephone Encounter (Signed)
**   Called patient left message to return call to reschedule appointment per Lauren with Dr Marlou Sa instead of Dr Ninfa Linden to discuss surgery **

## 2018-12-08 NOTE — Progress Notes (Signed)
Office Visit Note   Patient: Mark Farley.           Date of Birth: 1945/11/28           MRN: [id number] Visit Date: 12/07/2018 Requested by: Crecencio Mc, MD [address] [place],  [place] [address] PCP: Crecencio Mc, MD  Subjective: Chief Complaint  Patient presents with  . Right Knee - Pain  . Left Knee - Pain    HPI: [name] is a patient with bilateral knee arthritis left worse than right.  Has had arthroscopy on the left knee.  That was for medial meniscal tear.  He is an exceptionally fit patient in his 4s with significantly improved muscle tone over most of his peers.  He does report diminished walking endurance and pain in the left knee greater than the right.              ROS: All systems reviewed are negative as they relate to the chief complaint within the history of present illness.  Patient denies  fevers or chills.   Assessment & Plan: Visit Diagnoses:  1. Primary osteoarthritis of both knees     Plan: Impression is bilateral knee arthritis.  We talked today about total knee replacement.  I think he is heading for that but likely in November.  We discussed some of the risk and benefits and expected rehab time after that.  For today we will try to get him through with cortisone injection into both knees.  That helped him in the past.  I will see him back as needed.  I did say that if he wants to get that left knee replaced he should give [place] about 6 weeks notice to get that done in November.  Prior to surgery I would review the arthroscopy pictures from last year to determine if he was a candidate for partial versus total knee replacement.  I do think he would be a good candidate for press-fit prosthesis based on bone quality.  Follow-Up Instructions: No follow-ups on file.   Orders:  No orders of the defined types were placed in this encounter.  No orders of the defined types were placed in this encounter.     Procedures: Large Joint Inj:  bilateral knee on 12/08/2018 7:23 AM Indications: diagnostic evaluation, joint swelling and pain Details: 18 G 1.5 in needle, superolateral approach  Arthrogram: No  Medications (Right): 5 mL lidocaine 1 %; 4 mL bupivacaine 0.25 %; 40 mg methylPREDNISolone acetate 40 MG/ML Medications (Left): 5 mL lidocaine 1 %; 4 mL bupivacaine 0.25 %; 40 mg methylPREDNISolone acetate 40 MG/ML Outcome: tolerated well, no immediate complications Procedure, treatment alternatives, risks and benefits explained, specific risks discussed. Consent was given by the patient. Immediately prior to procedure a time out was called to verify the correct patient, procedure, equipment, support staff and site/side marked as required. Patient was prepped and draped in the usual sterile fashion.       Clinical Data: No additional findings.  Objective: Vital Signs: There were no vitals taken for this visit.  Physical Exam:   Constitutional: Patient appears well-developed HEENT:  Head: Normocephalic Eyes:EOM are normal Neck: Normal range of motion Cardiovascular: Normal rate Pulmonary/chest: Effort normal Neurologic: Patient is alert Skin: Skin is warm Psychiatric: Patient has normal mood and affect    Ortho Exam: Ortho exam demonstrates good quad tone bilateral knees.  Right knee has full extension and flexion to about 120 the left knee has  about a 5 to 8 degree flexion contracture but still has flexion easily to about 120.  No effusion in either knee.  Has medial greater than lateral joint line tenderness and slight varus alignment on that left-hand side.  Collateral and cruciate ligaments are stable.  Specialty Comments:  No specialty comments available.  Imaging: No results found.   PMFS History: Patient Active Problem List   Diagnosis Date Noted  . Educated About Covid-19 Virus Infection 09/25/2018  . Erectile dysfunction 09/25/2018  . Intention tremor 03/09/2017  . Tingling of left upper extremity  03/09/2017  . Runner's knee, right 03/09/2017  . Hepatic steatosis 03/09/2017  . Hearing loss due to cerumen impaction 03/08/2017  . Encounter for preventive health examination 05/26/2015  . SVT (supraventricular tachycardia) (HCC) 02/07/2015  . History of syncope 03/06/2014  . PVC's (premature ventricular contractions) 03/06/2014  . Osteoarthritis 02/08/2012  . Prediabetes 08/06/2011  . Overweight (BMI 25.0-29.9) 02/06/2011  . Family history of colon cancer requiring screening colonoscopy 02/05/2011  . Mixed hyperlipidemia 08/14/2008  . HYPERTENSION, BENIGN 08/14/2008   Past Medical History:  Diagnosis Date  . Arthritis    ? Gout  . Bilateral carpal tunnel syndrome    managed with nocturnal braces x 3 years  . COPD (chronic obstructive pulmonary disease) (HCC)   . Glucose intolerance (impaired glucose tolerance)   . History of syncope 2005   Unexplained, myoview negative  . Hyperlipidemia   . Hypertension   . Iron deficiency anemia   . Osteoarthritis of multiple joints   . Palpitations 2010   Life Watch with single PVC    Family History  Problem Relation Age of Onset  . Coronary artery disease Mother   . Heart attack Mother        MI  . Heart failure Father        CHF  . Colon cancer Brother   . Colon cancer Paternal Grandmother     Past Surgical History:  Procedure Laterality Date  . COLONOSCOPY     Social History   Occupational History  . Not on file  Tobacco Use  . Smoking status: Never Smoker  . Smokeless tobacco: Never Used  Substance and Sexual Activity  . Alcohol use: Yes    Alcohol/week: 14.0 standard drinks    Types: 14 Standard drinks or equivalent per week    Comment: 2 glasses of wine a day  . Drug use: No  . Sexual activity: Yes

## 2018-12-12 ENCOUNTER — Telehealth: Payer: Self-pay | Admitting: *Deleted

## 2018-12-12 NOTE — Telephone Encounter (Signed)
Patient calling to discuss recent testing results  ° °Please call  ° °

## 2018-12-12 NOTE — Telephone Encounter (Signed)
Left voicemail message to call back for results and recommendations. 

## 2018-12-12 NOTE — Telephone Encounter (Signed)
Spoke with the pt. Pt made aware of his CT cardiac score results and Dr.Gollan's recommendation. Pt sts that his statin therapy was stopped due to elevated enzymes.  Pt sts that he resumed crestor 10mg  daily in late April early May 2020. Pt was not on statin therapy when his most recent lipi panel was drawn  Adv the pt that I will fwd the update to [place] and we will give him a call back if he still wants him to start zetia. Pt agreeable with the plan.

## 2018-12-12 NOTE — Telephone Encounter (Signed)
-----   Message from Minna Merritts, MD sent at 12/11/2018 12:29 PM EDT ----- CT coronary calcium score elevated Would like LDL <70, currently 130 Would start zetia 10 mg daily with crestor 5 daily, LFT check in 3 months with lipid panel

## 2018-12-14 NOTE — Telephone Encounter (Signed)
I would add zetia as well as crestor 10 dont think crestor alone will get [place] there

## 2018-12-16 NOTE — Telephone Encounter (Signed)
I spoke with the patient. He is aware of Dr. Donivan Scull recommendations to:  1) add Zetia 10 mg once daily 2) continue Crestor 10 mg once daily  He states the he was on Crestor 10 mg daily and had to stop this due to elevated LFT's. He was seen by GI and almost all of his meds were stopped at that time.  He was seen by Dr. Derrel Nip in April and things had normalized. He resumed Crestor 10 mg once daily on 09/22/18. At this time, he prefers to give a little more time on just the Crestor 10 mg once daily and does not want to add another drug.   He is due to see Dr. Derrel Nip in October. I have advised he can follow up with her at that time and hopefully she will recheck his lipids then to re-evaluate where he is. The patient states he will request this from her.   No changes made at this time.

## 2018-12-19 ENCOUNTER — Telehealth: Payer: Self-pay | Admitting: Cardiovascular Disease

## 2018-12-19 NOTE — Telephone Encounter (Signed)
°*  STAT* If patient is at the pharmacy, call can be transferred to refill team.   1. Which medications need to be refilled? (please list name of each medication and dose if known) Micardis HCT 12.5 mg  2. Which pharmacy/location (including street and city if local pharmacy) is medication to be sent to? Requesting printed RX will pick up  3. Do they need a 30 day or 90 day supply? 90    Would like it to be written for 90 day supply for 3 refills. Patient or wife will be able to pick it up. Please advise

## 2018-12-20 MED ORDER — TELMISARTAN-HCTZ 40-12.5 MG PO TABS: 1.0000 | ORAL_TABLET | Freq: Every day | ORAL | 3 refills | Status: DC

## 2018-12-20 NOTE — Telephone Encounter (Signed)
Please advise if ok to refill Micardis HCTZ 40-12.5 mg tablet qd. Pt requesting printed Rx medication last filled by PCP.

## 2018-12-20 NOTE — Telephone Encounter (Signed)
Pt is aware to contact PCP for refill.

## 2019-01-09 ENCOUNTER — Encounter: Payer: Self-pay | Admitting: Orthopedic Surgery

## 2019-01-18 MED ORDER — TELMISARTAN-HCTZ 40-12.5 MG PO TABS: 1.0000 | ORAL_TABLET | Freq: Every day | ORAL | 3 refills | Status: DC

## 2019-02-09 ENCOUNTER — Telehealth: Payer: Self-pay

## 2019-02-09 DIAGNOSIS — E782 Mixed hyperlipidemia: Secondary | ICD-10-CM

## 2019-02-09 DIAGNOSIS — R7303 Prediabetes: Secondary | ICD-10-CM

## 2019-02-09 DIAGNOSIS — Z125 Encounter for screening for malignant neoplasm of prostate: Secondary | ICD-10-CM

## 2019-02-09 DIAGNOSIS — I10 Essential (primary) hypertension: Secondary | ICD-10-CM

## 2019-02-09 NOTE — Telephone Encounter (Signed)
Copied from [place] [id number]. Topic: General - Other >> Feb 09, 2019 11:52 AM Pauline Good wrote: Reason for CRM: pt need orders to have his annual labs before his CPE 10.26.20

## 2019-02-09 NOTE — Telephone Encounter (Signed)
He needs a medicare PSA as well,   but the last one was drawn Mar 23 2018 so he cannot have his labs done before then.

## 2019-02-09 NOTE — Telephone Encounter (Signed)
Pt is wanting to have blood work done before his CPE on 03/27/2019. I have ordered Lipid, CMP and A1c. Is there anything else that needs to be ordered?

## 2019-02-09 NOTE — Telephone Encounter (Signed)
Ordered medicare PSA. Lab appt has been scheduled and pt is aware of appt date and time.

## 2019-02-12 ENCOUNTER — Encounter: Payer: Self-pay | Admitting: Orthopedic Surgery

## 2019-02-14 ENCOUNTER — Ambulatory Visit: Payer: Medicare Other | Admitting: Orthopaedic Surgery

## 2019-02-15 ENCOUNTER — Ambulatory Visit (INDEPENDENT_AMBULATORY_CARE_PROVIDER_SITE_OTHER): Payer: Medicare Other | Admitting: Orthopedic Surgery

## 2019-02-15 ENCOUNTER — Encounter: Payer: Self-pay | Admitting: Orthopedic Surgery

## 2019-02-15 ENCOUNTER — Telehealth: Payer: Self-pay | Admitting: Orthopedic Surgery

## 2019-02-15 DIAGNOSIS — M170 Bilateral primary osteoarthritis of knee: Secondary | ICD-10-CM

## 2019-02-15 NOTE — Progress Notes (Signed)
Office Visit Note   Patient: Mark Farley.           Date of Birth: 1946-05-29           MRN: [id number] Visit Date: 02/15/2019 Requested by: Crecencio Mc, MD [address] [place],  [place] [address] PCP: Crecencio Mc, MD  Subjective: Chief Complaint  Patient presents with  . Follow-up    HPI: Mark Farley is a patient with bilateral knee arthritis.  He has end-stage arthritis in both knees.  He would like to have his right knee replaced.  He is failed conservative management including exercise program activity modification and injections.  Denies any personal or family history of DVT or pulmonary embolism.              ROS: All systems reviewed are negative as they relate to the chief complaint within the history of present illness.  Patient denies  fevers or chills.   Assessment & Plan: Visit Diagnoses:  1. Primary osteoarthritis of both knees     Plan: Impression is bilateral knee arthritis with right knee being more symptomatic.  Plan is right total knee replacement.  Risk benefits are discussed including but limited to infection nerve vessel damage knee stiffness incomplete pain relief.  Patient understands risk and benefits and wishes to proceed.  All questions answered.  Follow-Up Instructions: No follow-ups on file.   Orders:  No orders of the defined types were placed in this encounter.  No orders of the defined types were placed in this encounter.     Procedures: No procedures performed   Clinical Data: No additional findings.  Objective: Vital Signs: There were no vitals taken for this visit.  Physical Exam:   Constitutional: Patient appears well-developed HEENT:  Head: Normocephalic Eyes:EOM are normal Neck: Normal range of motion Cardiovascular: Normal rate Pulmonary/chest: Effort normal Neurologic: Patient is alert Skin: Skin is warm Psychiatric: Patient has normal mood and affect    Ortho Exam: Ortho exam demonstrates  good range of motion of the right knee with medial greater than lateral joint line tenderness.  Some varus alignment is present.  Pedal pulses palpable.  No groin pain with internal X rotation leg.  No other masses lymphadenopathy or skin changes noted in that right knee region  Specialty Comments:  No specialty comments available.  Imaging: No results found.   PMFS History: Patient Active Problem List   Diagnosis Date Noted  . Educated About Covid-19 Virus Infection 09/25/2018  . Erectile dysfunction 09/25/2018  . Intention tremor 03/09/2017  . Tingling of left upper extremity 03/09/2017  . Runner's knee, right 03/09/2017  . Hepatic steatosis 03/09/2017  . Hearing loss due to cerumen impaction 03/08/2017  . Encounter for preventive health examination 05/26/2015  . SVT (supraventricular tachycardia) ([place]) 02/07/2015  . History of syncope 03/06/2014  . PVC's (premature ventricular contractions) 03/06/2014  . Osteoarthritis 02/08/2012  . Prediabetes 08/06/2011  . Overweight (BMI 25.0-29.9) 02/06/2011  . Family history of colon cancer requiring screening colonoscopy 02/05/2011  . Mixed hyperlipidemia 08/14/2008  . HYPERTENSION, BENIGN 08/14/2008   Past Medical History:  Diagnosis Date  . Arthritis    ? Gout  . Bilateral carpal tunnel syndrome    managed with nocturnal braces x 3 years  . COPD (chronic obstructive pulmonary disease) ([facility] [place])   . Glucose intolerance (impaired glucose tolerance)   . History of syncope 2005   Unexplained, myoview negative  . Hyperlipidemia   . Hypertension   .  Iron deficiency anemia   . Osteoarthritis of multiple joints   . Palpitations 2010   Life Watch with single PVC    Family History  Problem Relation Age of Onset  . Coronary artery disease Mother   . Heart attack Mother        MI  . Heart failure Father        CHF  . Colon cancer Brother   . Colon cancer Paternal Grandmother     Past Surgical History:  Procedure Laterality Date   . COLONOSCOPY     Social History   Occupational History  . Not on file  Tobacco Use  . Smoking status: Never Smoker  . Smokeless tobacco: Never Used  Substance and Sexual Activity  . Alcohol use: Yes    Alcohol/week: 14.0 standard drinks    Types: 14 Standard drinks or equivalent per week    Comment: 2 glasses of wine a day  . Drug use: No  . Sexual activity: Yes

## 2019-02-15 NOTE — Telephone Encounter (Signed)
I called patient in reference to knee xrays.  He had some done at the [facility], but it has not been within the last year.  Patient would like to come to the office and have xrays done on Tuesday around 9am

## 2019-02-19 ENCOUNTER — Encounter: Payer: Self-pay | Admitting: Orthopedic Surgery

## 2019-02-20 ENCOUNTER — Other Ambulatory Visit: Payer: Self-pay

## 2019-02-21 ENCOUNTER — Ambulatory Visit: Payer: Self-pay

## 2019-02-21 ENCOUNTER — Other Ambulatory Visit: Payer: Medicare Other

## 2019-02-21 DIAGNOSIS — M25561 Pain in right knee: Secondary | ICD-10-CM

## 2019-02-22 ENCOUNTER — Telehealth: Payer: Self-pay | Admitting: Orthopedic Surgery

## 2019-02-22 NOTE — Telephone Encounter (Signed)
Patient called-wanted a release on file for the [facility] [place]. I emailed the release form for him to complete and sign per his request

## 2019-03-08 ENCOUNTER — Other Ambulatory Visit: Payer: Self-pay

## 2019-03-08 ENCOUNTER — Ambulatory Visit (INDEPENDENT_AMBULATORY_CARE_PROVIDER_SITE_OTHER): Payer: Medicare Other

## 2019-03-08 DIAGNOSIS — Z23 Encounter for immunization: Secondary | ICD-10-CM | POA: Diagnosis not present

## 2019-03-09 NOTE — Progress Notes (Signed)
[id number] [address], [address] AT [address] [place] [address] [place] [address] Phone: [phone] Fax: [phone]    Your procedure is scheduled on Tuesday, October 13th.  Report to [facility] Main Entrance "A" at 9:30 A.M., and check in at the Admitting office.  Call this number if you have problems the morning of surgery:  [phone]  Call [phone] if you have any questions prior to your surgery date Monday-Friday 8am-4pm   Remember:  Do not eat or drink after midnight the night before your surgery  You may drink clear liquids until 8:30 the morning of your surgery.   Clear liquids allowed are: Water, Non-Citrus Juices (without pulp), Carbonated Beverages, Clear Tea, Black Coffee Only, and Gatorade  Please complete your PRE-SURGERY ENSURE that was provided to you 8:30 A.M. the morning of surgery.  Please, if able, drink it in one setting. DO NOT SIP.    Take these medicines the morning of surgery with A SIP OF WATER  metoprolol succinate (TOPROL-XL)  omeprazole (PRILOSEC)   If needed - acetaminophen (TYLENOL), diltiazem (CARDIZEM), traMADol (ULTRAM), valACYclovir (VALTREX)   As of today, STOP taking any Aspirin (unless otherwise instructed by your surgeon), celecoxib (CELEBREX),  Aleve, Naproxen, Ibuprofen, Motrin, Advil, Goody's, BC's, all herbal medications, fish oil, and all vitamins.   The Morning of Surgery  Do not wear jewelry, make-up or nail polish.  Do not wear lotions, powders, or perfumes/colognes, or deodorant  Do not shave 48 hours prior to surgery.  Men may shave face and neck.  Do not bring valuables to the hospital.  [facility] is not responsible for any belongings or valuables.  If you are a smoker, DO NOT Smoke 24 hours prior to surgery IF you wear a CPAP at night please bring your mask, tubing, and machine the morning of surgery   Remember that you must have someone to transport you  home after your surgery, and remain with you for 24 hours if you are discharged the same day.  Contacts, glasses, hearing aids, dentures or bridgework may not be worn into surgery.   Leave your suitcase in the car.  After surgery it may be brought to your room.  For patients admitted to the hospital, discharge time will be determined by your treatment team.  Patients discharged the day of surgery will not be allowed to drive home.   Special instructions:   [place]- Preparing For Surgery  Before surgery, you can play an important role. Because skin is not sterile, your skin needs to be as free of germs as possible. You can reduce the number of germs on your skin by washing with CHG (chlorahexidine gluconate) Soap before surgery.  CHG is an antiseptic cleaner which kills germs and bonds with the skin to continue killing germs even after washing.    Oral Hygiene is also important to reduce your risk of infection.  Remember - BRUSH YOUR TEETH THE MORNING OF SURGERY WITH YOUR REGULAR TOOTHPASTE  Please do not use if you have an allergy to CHG or antibacterial soaps. If your skin becomes reddened/irritated stop using the CHG.  Do not shave (including legs and underarms) for at least 48 hours prior to first CHG shower. It is OK to shave your face.  Please follow these instructions carefully.   1. Shower the NIGHT BEFORE SURGERY and the MORNING OF SURGERY with CHG Soap.   2. If you chose  to wash your hair, wash your hair first as usual with your normal shampoo.  3. After you shampoo, rinse your hair and body thoroughly to remove the shampoo.  4. Use CHG as you would any other liquid soap. You can apply CHG directly to the skin and wash gently with a scrungie or a clean washcloth.   5. Apply the CHG Soap to your body ONLY FROM THE NECK DOWN.  Do not use on open wounds or open sores. Avoid contact with your eyes, ears, mouth and genitals (private parts). Wash Face and genitals (private parts)   with your normal soap.   6. Wash thoroughly, paying special attention to the area where your surgery will be performed.  7. Thoroughly rinse your body with warm water from the neck down.  8. DO NOT shower/wash with your normal soap after using and rinsing off the CHG Soap.  9. Pat yourself dry with a CLEAN TOWEL.  10. Wear CLEAN PAJAMAS to bed the night before surgery, wear comfortable clothes the morning of surgery  11. Place CLEAN SHEETS on your bed the night of your first shower and DO NOT SLEEP WITH PETS.  Day of Surgery: Do not apply any deodorants/lotions. Please shower the morning of surgery with the CHG soap  Please wear clean clothes to the hospital/surgery center.   Remember to brush your teeth WITH YOUR REGULAR TOOTHPASTE.  Please read over the following fact sheets that you were given.

## 2019-03-10 ENCOUNTER — Other Ambulatory Visit (HOSPITAL_COMMUNITY)
Admission: RE | Admit: 2019-03-10 | Discharge: 2019-03-10 | Disposition: A | Discharge status: Home / Self Care | Payer: Medicare Other | Source: Ambulatory Visit | Attending: Orthopedic Surgery | Admitting: Orthopedic Surgery

## 2019-03-10 ENCOUNTER — Encounter (HOSPITAL_COMMUNITY): Payer: Self-pay

## 2019-03-10 ENCOUNTER — Other Ambulatory Visit: Payer: Self-pay

## 2019-03-10 ENCOUNTER — Encounter (HOSPITAL_COMMUNITY)
Admission: RE | Admit: 2019-03-10 | Discharge: 2019-03-10 | Disposition: A | Discharge status: Home / Self Care | Payer: Medicare Other | Source: Ambulatory Visit | Attending: Orthopedic Surgery | Admitting: Orthopedic Surgery

## 2019-03-10 DIAGNOSIS — Z01812 Encounter for preprocedural laboratory examination: Secondary | ICD-10-CM | POA: Diagnosis present

## 2019-03-10 DIAGNOSIS — Z20828 Contact with and (suspected) exposure to other viral communicable diseases: Secondary | ICD-10-CM | POA: Diagnosis not present

## 2019-03-10 DIAGNOSIS — M1711 Unilateral primary osteoarthritis, right knee: Secondary | ICD-10-CM | POA: Insufficient documentation

## 2019-03-10 DIAGNOSIS — I10 Essential (primary) hypertension: Secondary | ICD-10-CM | POA: Insufficient documentation

## 2019-03-10 HISTORY — DX: Gastro-esophageal reflux disease without esophagitis: K21.9

## 2019-03-10 HISTORY — DX: Prediabetes: R73.03

## 2019-03-10 HISTORY — DX: Cardiac arrhythmia, unspecified: I49.9

## 2019-03-10 HISTORY — DX: Cardiac murmur, unspecified: R01.1

## 2019-03-10 LAB — CBC
HCT: 43.6 % (ref 39.0–52.0)
Hemoglobin: 15.2 g/dL (ref 13.0–17.0)
MCH: 31.8 pg (ref 26.0–34.0)
MCHC: 34.9 g/dL (ref 30.0–36.0)
MCV: 91.2 fL (ref 80.0–100.0)
Platelets: 175 10*3/uL (ref 150–400)
RBC: 4.78 MIL/uL (ref 4.22–5.81)
RDW: 12.5 % (ref 11.5–15.5)
WBC: 6.1 10*3/uL (ref 4.0–10.5)
nRBC: 0 % (ref 0.0–0.2)

## 2019-03-10 LAB — BASIC METABOLIC PANEL
Anion gap: 9 (ref 5–15)
BUN: 10 mg/dL (ref 8–23)
CO2: 28 mmol/L (ref 22–32)
Calcium: 9.6 mg/dL (ref 8.9–10.3)
Chloride: 98 mmol/L (ref 98–111)
Creatinine, Ser: 0.75 mg/dL (ref 0.61–1.24)
GFR calc Af Amer: >60 mL/min (ref >60)
GFR calc non Af Amer: >60 mL/min (ref >60)
Glucose, Bld: 110 mg/dL — ABNORMAL HIGH (ref 70–99)
Potassium: 4.2 mmol/L (ref 3.5–5.1)
Sodium: 135 mmol/L (ref 135–145)

## 2019-03-10 LAB — URINALYSIS, ROUTINE W REFLEX MICROSCOPIC
Bilirubin Urine: NEGATIVE
Glucose, UA: NEGATIVE mg/dL
Hgb urine dipstick: NEGATIVE
Ketones, ur: NEGATIVE mg/dL
Leukocytes,Ua: NEGATIVE
Nitrite: NEGATIVE
Protein, ur: NEGATIVE mg/dL
Specific Gravity, Urine: 1.01 (ref 1.005–1.030)
pH: 7 (ref 5.0–8.0)

## 2019-03-10 LAB — GLUCOSE, CAPILLARY: Glucose-Capillary: 93 mg/dL (ref 70–99)

## 2019-03-10 LAB — HEMOGLOBIN A1C
Hgb A1c MFr Bld: 5.7 % — ABNORMAL HIGH (ref 4.8–5.6)
Mean Plasma Glucose: 116.89 mg/dL

## 2019-03-10 LAB — SURGICAL PCR SCREEN
MRSA, PCR: NEGATIVE
Staphylococcus aureus: POSITIVE — AB

## 2019-03-10 NOTE — Progress Notes (Signed)
PCP - Helene Kelp L. Derrel Nip, MD Cardiologist - Ida Rogue, MD  PPM/ICD - Denies Device Orders - N/A Rep Notified N/A  Chest x-ray - N/A EKG - 03/10/2019 Stress Test - ~2008 at [facility] in [place] at [facility] in [place] Cath - Denies  Sleep Study - Denies CPAP - N/A  Fasting Blood Sugar - 90-110 Checks Blood Sugar  every other month   Blood Thinner Instructions: N/A Aspirin Instructions: N/A  ERAS Protcol - Yes PRE-SURGERY Ensure - Yes  COVID TEST- 03/10/2019   Anesthesia review: Yes, Cardiac hx; requested information from [facility].  Patient denies shortness of breath, fever, cough and chest pain at PAT appointment   Coronavirus Screening  Have you experienced the following symptoms:  Cough yes/no: No Fever (>100.28F)  yes/no: No Runny nose yes/no: No Sore throat yes/no: No Difficulty breathing/shortness of breath  yes/no: No  Have you or a family member traveled in the last 14 days and where? yes/no: No   If the patient indicates "YES" to the above questions, their PAT will be rescheduled to limit the exposure to others and, the surgeon will be notified. THE PATIENT WILL NEED TO BE ASYMPTOMATIC FOR 14 DAYS.   If the patient is not experiencing any of these symptoms, the PAT nurse will instruct them to NOT bring anyone with them to their appointment since they may have these symptoms or traveled as well.   Please remind your patients and families that hospital visitation restrictions are in effect and the importance of the restrictions.     Patient verbalized understanding of instructions that were given to them at the PAT appointment. Patient was also instructed that they will need to review over the PAT instructions again at home before surgery.

## 2019-03-11 LAB — URINE CULTURE: Culture: NO GROWTH

## 2019-03-13 LAB — NOVEL CORONAVIRUS, NAA (HOSP ORDER, SEND-OUT TO REF LAB; TAT 18-24 HRS): SARS-CoV-2, NAA: NOT DETECTED

## 2019-03-13 NOTE — Progress Notes (Signed)
Anesthesia Chart Review: Follows with cardiology, Dr. Rockey Situ, for hx of SVT as well as for risk factor modification. Remote history of syncope with negative Myoview in 2005. Hx of palpitations and had a Life Watch monitor for several weeks but no events. Monitor only caught a single PVC. Previous Holter monitor showing runs of SVT, rate up to 150 bpm. SVT treated with metoprolol and PRN diltiazem. Last seen by Dr. Rockey Situ 09/29/18. Per note, he denied having significant arrhythmia. He reported not needing to take diltiazem.  COPD listed in pt history, however review of PCP notes does not show any discussion of COPD. No PFTs documented in Epic. Pt is not currently on any respiratory medications.   EKG 12/06/18 shows sinus rhythm, rate 64.  Event monitor 01/16/15: Normal sinus rhythm. Frequent PACs and runs of SVT. Rare PVCs. Patient's events correlated with PACs and SVT.    Wynonia Musty [facility] Short Stay Center/Anesthesiology Phone [phone] 03/13/2019 9:03 AM

## 2019-03-13 NOTE — Anesthesia Preprocedure Evaluation (Addendum)
Anesthesia Evaluation  Patient identified by MRN, date of birth, ID band Patient awake    Reviewed: Allergy & Precautions, NPO status , Patient's Chart, lab work & pertinent test results  Airway Mallampati: II  TM Distance: >3 FB Neck ROM: Full    Dental  (+) Teeth Intact, Dental Advisory Given   Pulmonary    breath sounds clear to auscultation       Cardiovascular hypertension,  Rhythm:Regular Rate:Normal     Neuro/Psych    GI/Hepatic   Endo/Other    Renal/GU      Musculoskeletal   Abdominal   Peds  Hematology   Anesthesia Other Findings   Reproductive/Obstetrics                           Anesthesia Physical Anesthesia Plan  ASA: III  Anesthesia Plan: MAC and Spinal   Post-op Pain Management:  Regional for Post-op pain   Induction:   PONV Risk Score and Plan: Ondansetron and Dexamethasone  Airway Management Planned: Simple Face Mask and Natural Airway  Additional Equipment:   Intra-op Plan:   Post-operative Plan:   Informed Consent: I have reviewed the patients History and Physical, chart, labs and discussed the procedure including the risks, benefits and alternatives for the proposed anesthesia with the patient or authorized representative who has indicated his/her understanding and acceptance.     Dental advisory given  Plan Discussed with:   Anesthesia Plan Comments: (Follows with cardiology, Dr. Rockey Situ, for hx of SVT as well as for risk factor modification. Remote history of syncope with negative Myoview in 2005. Hx of palpitations and had a Life Watch monitor for several weeks but no events. Monitor only caught a single PVC. SVT treated with metoprolol and PRN diltiazem. Previous Holter monitor showing runs of SVT, rate up to 150 bpm. Last seen by Dr. Rockey Situ 09/29/18. Per note, he denied having significant arrhythmia. He reported not needing to take diltiazem.  COPD  listed in pt history, however review of PCP notes does not show any discussion of COPD. No PFTs documented in Epic. Pt is not currently on any respiratory medications.   EKG 12/06/18 shows sinus rhythm, rate 64.  Event monitor 01/16/15: Normal sinus rhythm. Frequent PACs and runs of SVT. Rare PVCs. Patient's events correlated with PACs and SVT. )     Anesthesia Quick Evaluation

## 2019-03-14 ENCOUNTER — Ambulatory Visit (HOSPITAL_COMMUNITY): Payer: Medicare Other | Admitting: Physician Assistant

## 2019-03-14 ENCOUNTER — Other Ambulatory Visit: Payer: Self-pay

## 2019-03-14 ENCOUNTER — Encounter (HOSPITAL_COMMUNITY)
Admission: AD | Disposition: A | Discharge status: Home Health | Payer: Self-pay | Source: Home / Self Care | Attending: Orthopedic Surgery

## 2019-03-14 ENCOUNTER — Inpatient Hospital Stay (HOSPITAL_COMMUNITY)
Admission: AD | Admit: 2019-03-14 | Discharge: 2019-03-16 | DRG: 470 | Disposition: A | Discharge status: Home Health | Payer: Medicare Other | Attending: Orthopedic Surgery | Admitting: Orthopedic Surgery

## 2019-03-14 ENCOUNTER — Encounter (HOSPITAL_COMMUNITY): Payer: Self-pay | Admitting: Anesthesiology

## 2019-03-14 DIAGNOSIS — E782 Mixed hyperlipidemia: Secondary | ICD-10-CM | POA: Diagnosis present

## 2019-03-14 DIAGNOSIS — M1711 Unilateral primary osteoarthritis, right knee: Principal | ICD-10-CM | POA: Diagnosis present

## 2019-03-14 DIAGNOSIS — Z96652 Presence of left artificial knee joint: Secondary | ICD-10-CM | POA: Diagnosis present

## 2019-03-14 DIAGNOSIS — Z91030 Bee allergy status: Secondary | ICD-10-CM

## 2019-03-14 DIAGNOSIS — I10 Essential (primary) hypertension: Secondary | ICD-10-CM | POA: Diagnosis present

## 2019-03-14 DIAGNOSIS — J449 Chronic obstructive pulmonary disease, unspecified: Secondary | ICD-10-CM | POA: Diagnosis present

## 2019-03-14 DIAGNOSIS — Z96651 Presence of right artificial knee joint: Secondary | ICD-10-CM | POA: Diagnosis present

## 2019-03-14 DIAGNOSIS — K219 Gastro-esophageal reflux disease without esophagitis: Secondary | ICD-10-CM | POA: Diagnosis present

## 2019-03-14 DIAGNOSIS — Z8249 Family history of ischemic heart disease and other diseases of the circulatory system: Secondary | ICD-10-CM

## 2019-03-14 HISTORY — PX: TOTAL KNEE ARTHROPLASTY: SHX125

## 2019-03-14 LAB — GLUCOSE, CAPILLARY: Glucose-Capillary: 81 mg/dL (ref 70–99)

## 2019-03-14 SURGERY — ARTHROPLASTY, KNEE, TOTAL
Anesthesia: Monitor Anesthesia Care | Site: Knee | Laterality: Right

## 2019-03-14 MED ORDER — BUPIVACAINE LIPOSOME 1.3 % IJ SUSP
INTRAMUSCULAR | Status: DC | PRN
  Administered 2019-03-14: 20 mL

## 2019-03-14 MED ORDER — LACTATED RINGERS IV SOLN
INTRAVENOUS | Status: DC | PRN
  Administered 2019-03-14 (×2): via INTRAVENOUS

## 2019-03-14 MED ORDER — MORPHINE SULFATE (PF) 4 MG/ML IV SOLN
INTRAVENOUS | Status: DC | PRN
  Administered 2019-03-14: 8 mg

## 2019-03-14 MED ORDER — MIDAZOLAM HCL 2 MG/2ML IJ SOLN
1.0000 mg | Freq: Once | INTRAMUSCULAR | Status: AC
  Administered 2019-03-14: 11:00:00 1 mg via INTRAVENOUS

## 2019-03-14 MED ORDER — GLYCOPYRROLATE 0.2 MG/ML IJ SOLN
INTRAMUSCULAR | Status: DC | PRN
  Administered 2019-03-14: 0.2 mg via INTRAVENOUS

## 2019-03-14 MED ORDER — TRANEXAMIC ACID-NACL 1000-0.7 MG/100ML-% IV SOLN
INTRAVENOUS | Status: AC
  Filled 2019-03-14: qty 100

## 2019-03-14 MED ORDER — METHOCARBAMOL 500 MG PO TABS
500.0000 mg | ORAL_TABLET | Freq: Four times a day (QID) | ORAL | Status: DC | PRN
  Administered 2019-03-15 – 2019-03-16 (×4): 500 mg via ORAL
  Filled 2019-03-14 (×4): qty 1

## 2019-03-14 MED ORDER — FENTANYL CITRATE (PF) 100 MCG/2ML IJ SOLN
INTRAMUSCULAR | Status: DC | PRN
  Administered 2019-03-14 (×2): 50 ug via INTRAVENOUS

## 2019-03-14 MED ORDER — SODIUM CHLORIDE 0.9 % IR SOLN
Status: DC | PRN
  Administered 2019-03-14: 3000 mL

## 2019-03-14 MED ORDER — ASPIRIN 81 MG PO CHEW
81.0000 mg | CHEWABLE_TABLET | Freq: Two times a day (BID) | ORAL | Status: DC
  Administered 2019-03-14 – 2019-03-16 (×4): 81 mg via ORAL
  Filled 2019-03-14 (×4): qty 1

## 2019-03-14 MED ORDER — CHLORHEXIDINE GLUCONATE 4 % EX LIQD: 60.0000 mL | Freq: Once | CUTANEOUS | Status: DC

## 2019-03-14 MED ORDER — ALBUMIN HUMAN 5 % IV SOLN: 25.0000 g | Freq: Once | INTRAVENOUS | Status: DC

## 2019-03-14 MED ORDER — LACTATED RINGERS IV SOLN
INTRAVENOUS | Status: DC
  Administered 2019-03-14: 18:00:00 via INTRAVENOUS

## 2019-03-14 MED ORDER — PHENOL 1.4 % MT LIQD: 1.0000 | OROMUCOSAL | Status: DC | PRN

## 2019-03-14 MED ORDER — PHENYLEPHRINE 40 MCG/ML (10ML) SYRINGE FOR IV PUSH (FOR BLOOD PRESSURE SUPPORT)
PREFILLED_SYRINGE | INTRAVENOUS | Status: DC | PRN
  Administered 2019-03-14: 40 ug via INTRAVENOUS

## 2019-03-14 MED ORDER — METOCLOPRAMIDE HCL 5 MG PO TABS: 5.0000 mg | ORAL_TABLET | Freq: Three times a day (TID) | ORAL | Status: DC | PRN

## 2019-03-14 MED ORDER — BUPIVACAINE HCL (PF) 0.25 % IJ SOLN
INTRAMUSCULAR | Status: AC
  Filled 2019-03-14: qty 30

## 2019-03-14 MED ORDER — OXYCODONE HCL 5 MG PO TABS: 5.0000 mg | ORAL_TABLET | Freq: Once | ORAL | Status: DC | PRN

## 2019-03-14 MED ORDER — 0.9 % SODIUM CHLORIDE (POUR BTL) OPTIME
TOPICAL | Status: DC | PRN
  Administered 2019-03-14 (×4): 1000 mL

## 2019-03-14 MED ORDER — MIDAZOLAM HCL 2 MG/2ML IJ SOLN
INTRAMUSCULAR | Status: AC
  Filled 2019-03-14: qty 2

## 2019-03-14 MED ORDER — LACTATED RINGERS IV SOLN: INTRAVENOUS | Status: AC

## 2019-03-14 MED ORDER — OXYCODONE HCL 5 MG/5ML PO SOLN: 5.0000 mg | Freq: Once | ORAL | Status: DC | PRN

## 2019-03-14 MED ORDER — ALBUMIN HUMAN 5 % IV SOLN
INTRAVENOUS | Status: AC
  Filled 2019-03-14: qty 250

## 2019-03-14 MED ORDER — MIDAZOLAM HCL 2 MG/2ML IJ SOLN
INTRAMUSCULAR | Status: AC
  Administered 2019-03-14: 1 mg via INTRAVENOUS
  Filled 2019-03-14: qty 2

## 2019-03-14 MED ORDER — BUPIVACAINE LIPOSOME 1.3 % IJ SUSP
20.0000 mL | INTRAMUSCULAR | Status: AC
  Filled 2019-03-14 (×2): qty 20

## 2019-03-14 MED ORDER — ONDANSETRON HCL 4 MG PO TABS: 4.0000 mg | ORAL_TABLET | Freq: Four times a day (QID) | ORAL | Status: DC | PRN

## 2019-03-14 MED ORDER — PROPOFOL 10 MG/ML IV BOLUS
INTRAVENOUS | Status: DC | PRN
  Administered 2019-03-14: 20 mg via INTRAVENOUS
  Administered 2019-03-14: 30 mg via INTRAVENOUS

## 2019-03-14 MED ORDER — MORPHINE SULFATE (PF) 4 MG/ML IV SOLN
INTRAVENOUS | Status: AC
  Filled 2019-03-14: qty 2

## 2019-03-14 MED ORDER — TRANEXAMIC ACID 1000 MG/10ML IV SOLN
INTRAVENOUS | Status: DC | PRN
  Administered 2019-03-14: 13:00:00 2000 mg via TOPICAL

## 2019-03-14 MED ORDER — ONDANSETRON HCL 4 MG/2ML IJ SOLN: 4.0000 mg | Freq: Four times a day (QID) | INTRAMUSCULAR | Status: DC | PRN

## 2019-03-14 MED ORDER — FENTANYL CITRATE (PF) 100 MCG/2ML IJ SOLN: 25.0000 ug | INTRAMUSCULAR | Status: DC | PRN

## 2019-03-14 MED ORDER — HYDROMORPHONE HCL 1 MG/ML IJ SOLN
0.5000 mg | INTRAMUSCULAR | Status: DC | PRN
  Administered 2019-03-14: 0.5 mg via INTRAVENOUS
  Filled 2019-03-14: qty 1

## 2019-03-14 MED ORDER — ACETAMINOPHEN 325 MG PO TABS: 325.0000 mg | ORAL_TABLET | Freq: Four times a day (QID) | ORAL | Status: DC | PRN

## 2019-03-14 MED ORDER — STERILE WATER FOR IRRIGATION IR SOLN
Status: DC | PRN
  Administered 2019-03-14: 1000 mL

## 2019-03-14 MED ORDER — TRANEXAMIC ACID-NACL 1000-0.7 MG/100ML-% IV SOLN
1000.0000 mg | INTRAVENOUS | Status: AC
  Administered 2019-03-14: 13:00:00 1000 mg via INTRAVENOUS

## 2019-03-14 MED ORDER — MENTHOL 3 MG MT LOZG: 1.0000 | LOZENGE | OROMUCOSAL | Status: DC | PRN

## 2019-03-14 MED ORDER — METHOCARBAMOL 1000 MG/10ML IJ SOLN
500.0000 mg | Freq: Four times a day (QID) | INTRAVENOUS | Status: DC | PRN
  Filled 2019-03-14: qty 5

## 2019-03-14 MED ORDER — CEFAZOLIN SODIUM-DEXTROSE 2-4 GM/100ML-% IV SOLN
2.0000 g | INTRAVENOUS | Status: AC
  Administered 2019-03-14: 2 g via INTRAVENOUS

## 2019-03-14 MED ORDER — MIDAZOLAM HCL 5 MG/5ML IJ SOLN
INTRAMUSCULAR | Status: DC | PRN
  Administered 2019-03-14: 2 mg via INTRAVENOUS

## 2019-03-14 MED ORDER — SODIUM CHLORIDE 0.9 % IV SOLN
INTRAVENOUS | Status: DC | PRN
  Administered 2019-03-14: 60 ug/min via INTRAVENOUS

## 2019-03-14 MED ORDER — ONDANSETRON HCL 4 MG/2ML IJ SOLN
INTRAMUSCULAR | Status: DC | PRN
  Administered 2019-03-14: 4 mg via INTRAVENOUS

## 2019-03-14 MED ORDER — NAPROXEN 250 MG PO TABS
250.0000 mg | ORAL_TABLET | Freq: Two times a day (BID) | ORAL | Status: DC
  Administered 2019-03-14 – 2019-03-16 (×4): 250 mg via ORAL
  Filled 2019-03-14 (×5): qty 1

## 2019-03-14 MED ORDER — ONDANSETRON HCL 4 MG/2ML IJ SOLN: 4.0000 mg | Freq: Once | INTRAMUSCULAR | Status: DC | PRN

## 2019-03-14 MED ORDER — SODIUM CHLORIDE 0.9% FLUSH
INTRAVENOUS | Status: DC | PRN
  Administered 2019-03-14: 20 mL

## 2019-03-14 MED ORDER — CLONIDINE HCL (ANALGESIA) 100 MCG/ML EP SOLN
EPIDURAL | Status: DC | PRN
  Administered 2019-03-14: 100 ug

## 2019-03-14 MED ORDER — LACTATED RINGERS IV SOLN: INTRAVENOUS | Status: DC

## 2019-03-14 MED ORDER — BUPIVACAINE HCL 0.25 % IJ SOLN
INTRAMUSCULAR | Status: DC | PRN
  Administered 2019-03-14: 20 mL
  Administered 2019-03-14: 10 mL

## 2019-03-14 MED ORDER — PROPOFOL 500 MG/50ML IV EMUL
INTRAVENOUS | Status: DC | PRN
  Administered 2019-03-14: 60 ug/kg/min via INTRAVENOUS

## 2019-03-14 MED ORDER — CEFAZOLIN SODIUM-DEXTROSE 2-4 GM/100ML-% IV SOLN
INTRAVENOUS | Status: AC
  Filled 2019-03-14: qty 100

## 2019-03-14 MED ORDER — FENTANYL CITRATE (PF) 250 MCG/5ML IJ SOLN
INTRAMUSCULAR | Status: AC
  Filled 2019-03-14: qty 5

## 2019-03-14 MED ORDER — FENTANYL CITRATE (PF) 100 MCG/2ML IJ SOLN
50.0000 ug | Freq: Once | INTRAMUSCULAR | Status: AC
  Administered 2019-03-14: 11:00:00 50 ug via INTRAVENOUS

## 2019-03-14 MED ORDER — OXYCODONE HCL 5 MG PO TABS
5.0000 mg | ORAL_TABLET | ORAL | Status: DC | PRN
  Administered 2019-03-14 – 2019-03-16 (×9): 10 mg via ORAL
  Filled 2019-03-14 (×9): qty 2

## 2019-03-14 MED ORDER — METOCLOPRAMIDE HCL 5 MG/ML IJ SOLN: 5.0000 mg | Freq: Three times a day (TID) | INTRAMUSCULAR | Status: DC | PRN

## 2019-03-14 MED ORDER — LIDOCAINE 2% (20 MG/ML) 5 ML SYRINGE
INTRAMUSCULAR | Status: DC | PRN
  Administered 2019-03-14: 50 mg via INTRAVENOUS

## 2019-03-14 MED ORDER — TRANEXAMIC ACID 1000 MG/10ML IV SOLN
2000.0000 mg | Freq: Once | INTRAVENOUS | Status: DC
  Filled 2019-03-14 (×2): qty 20

## 2019-03-14 MED ORDER — DEXAMETHASONE SODIUM PHOSPHATE 4 MG/ML IJ SOLN
INTRAMUSCULAR | Status: DC | PRN
  Administered 2019-03-14: 5 mg via INTRAVENOUS

## 2019-03-14 MED ORDER — POVIDONE-IODINE 10 % EX SWAB: 2.0000 "application " | Freq: Once | CUTANEOUS | Status: DC

## 2019-03-14 MED ORDER — FENTANYL CITRATE (PF) 100 MCG/2ML IJ SOLN
INTRAMUSCULAR | Status: AC
  Administered 2019-03-14: 11:00:00 50 ug via INTRAVENOUS
  Filled 2019-03-14: qty 2

## 2019-03-14 MED ORDER — CEFAZOLIN SODIUM-DEXTROSE 2-4 GM/100ML-% IV SOLN
2.0000 g | Freq: Four times a day (QID) | INTRAVENOUS | Status: AC
  Administered 2019-03-14 – 2019-03-15 (×2): 2 g via INTRAVENOUS
  Filled 2019-03-14 (×2): qty 100

## 2019-03-14 MED ORDER — DOCUSATE SODIUM 100 MG PO CAPS
100.0000 mg | ORAL_CAPSULE | Freq: Two times a day (BID) | ORAL | Status: DC
  Administered 2019-03-14 – 2019-03-16 (×4): 100 mg via ORAL
  Filled 2019-03-14 (×4): qty 1

## 2019-03-14 SURGICAL SUPPLY — 78 items
BAG DECANTER FOR FLEXI CONT (MISCELLANEOUS) ×3 IMPLANT
BANDAGE ESMARK 6X9 LF (GAUZE/BANDAGES/DRESSINGS) ×1 IMPLANT
BLADE SAG 18X100X1.27 (BLADE) ×3 IMPLANT
BNDG COHESIVE 6X5 TAN STRL LF (GAUZE/BANDAGES/DRESSINGS) ×3 IMPLANT
BNDG ELASTIC 4X5.8 VLCR STR LF (GAUZE/BANDAGES/DRESSINGS) ×3 IMPLANT
BNDG ELASTIC 6X15 VLCR STRL LF (GAUZE/BANDAGES/DRESSINGS) IMPLANT
BNDG ELASTIC 6X5.8 VLCR STR LF (GAUZE/BANDAGES/DRESSINGS) ×6 IMPLANT
BNDG ESMARK 6X9 LF (GAUZE/BANDAGES/DRESSINGS) ×3
BOWL SMART MIX CTS (DISPOSABLE) IMPLANT
CLOSURE WOUND 1/2 X4 (GAUZE/BANDAGES/DRESSINGS) ×2
COMPONENT TRI CR FEM SZ5 KNEE (Orthopedic Implant) ×1 IMPLANT
CONT SPEC 4OZ CLIKSEAL STRL BL (MISCELLANEOUS) ×3 IMPLANT
COVER SURGICAL LIGHT HANDLE (MISCELLANEOUS) ×3 IMPLANT
COVER WAND RF STERILE (DRAPES) IMPLANT
CUFF TOURN SGL QUICK 34 (TOURNIQUET CUFF) ×2
CUFF TOURN SGL QUICK 42 (TOURNIQUET CUFF) IMPLANT
CUFF TRNQT CYL 34X4.125X (TOURNIQUET CUFF) ×1 IMPLANT
DECANTER SPIKE VIAL GLASS SM (MISCELLANEOUS) ×3 IMPLANT
DRAPE INCISE IOBAN 66X45 STRL (DRAPES) IMPLANT
DRAPE ORTHO SPLIT 77X108 STRL (DRAPES) ×6
DRAPE SURG ORHT 6 SPLT 77X108 (DRAPES) ×3 IMPLANT
DRAPE U-SHAPE 47X51 STRL (DRAPES) ×3 IMPLANT
DRSG AQUACEL AG ADV 3.5X10 (GAUZE/BANDAGES/DRESSINGS) ×3 IMPLANT
DRSG AQUACEL AG ADV 3.5X14 (GAUZE/BANDAGES/DRESSINGS) IMPLANT
DURAPREP 26ML APPLICATOR (WOUND CARE) ×6 IMPLANT
ELECT CAUTERY BLADE 6.4 (BLADE) ×3 IMPLANT
ELECT REM PT RETURN 9FT ADLT (ELECTROSURGICAL) ×3
ELECTRODE REM PT RTRN 9FT ADLT (ELECTROSURGICAL) ×1 IMPLANT
GAUZE SPONGE 4X4 12PLY STRL (GAUZE/BANDAGES/DRESSINGS) ×3 IMPLANT
GLOVE BIOGEL PI IND STRL 7.5 (GLOVE) IMPLANT
GLOVE BIOGEL PI IND STRL 8 (GLOVE) ×1 IMPLANT
GLOVE BIOGEL PI INDICATOR 7.5 (GLOVE)
GLOVE BIOGEL PI INDICATOR 8 (GLOVE) ×2
GLOVE ECLIPSE 7.0 STRL STRAW (GLOVE) ×6 IMPLANT
GLOVE SURG ORTHO 8.0 STRL STRW (GLOVE) ×3 IMPLANT
GOWN STRL REUS W/ TWL LRG LVL3 (GOWN DISPOSABLE) ×4 IMPLANT
GOWN STRL REUS W/TWL LRG LVL3 (GOWN DISPOSABLE) ×8
HANDPIECE INTERPULSE COAX TIP (DISPOSABLE) ×2
HOOD PEEL AWAY FLYTE STAYCOOL (MISCELLANEOUS) ×12 IMPLANT
IMMOBILIZER KNEE 20 (SOFTGOODS)
IMMOBILIZER KNEE 20 THIGH 36 (SOFTGOODS) IMPLANT
IMMOBILIZER KNEE 22 UNIV (SOFTGOODS) ×3 IMPLANT
IMMOBILIZER KNEE 24 THIGH 36 (MISCELLANEOUS) IMPLANT
IMMOBILIZER KNEE 24 UNIV (MISCELLANEOUS)
INSERT KNEE TIB BRG 6 (Insert) ×3 IMPLANT
IV NS IRRIG 3000ML ARTHROMATIC (IV SOLUTION) ×3 IMPLANT
KIT BASIN OR (CUSTOM PROCEDURE TRAY) ×3 IMPLANT
KIT TURNOVER KIT B (KITS) ×3 IMPLANT
KNEE PATELLA ASYMMETRIC 10X35 (Knees) ×3 IMPLANT
KNEE TIBIAL COMPONENT SZ6 (Knees) ×3 IMPLANT
MANIFOLD NEPTUNE II (INSTRUMENTS) ×3 IMPLANT
NEEDLE 22X1 1/2 (OR ONLY) (NEEDLE) ×6 IMPLANT
NEEDLE SPNL 18GX3.5 QUINCKE PK (NEEDLE) ×3 IMPLANT
NS IRRIG 1000ML POUR BTL (IV SOLUTION) ×12 IMPLANT
PACK TOTAL JOINT (CUSTOM PROCEDURE TRAY) ×3 IMPLANT
PAD ARMBOARD 7.5X6 YLW CONV (MISCELLANEOUS) ×3 IMPLANT
PAD CAST 4YDX4 CTTN HI CHSV (CAST SUPPLIES) IMPLANT
PADDING CAST COTTON 4X4 STRL (CAST SUPPLIES)
PADDING CAST COTTON 6X4 STRL (CAST SUPPLIES) ×3 IMPLANT
PIN FLUTED HEDLESS FIX 3.5X1/8 (PIN) ×3 IMPLANT
SET HNDPC FAN SPRY TIP SCT (DISPOSABLE) ×1 IMPLANT
STRIP CLOSURE SKIN 1/2X4 (GAUZE/BANDAGES/DRESSINGS) ×4 IMPLANT
SUCTION FRAZIER HANDLE 10FR (MISCELLANEOUS) ×2
SUCTION TUBE FRAZIER 10FR DISP (MISCELLANEOUS) ×1 IMPLANT
SUT MNCRL AB 3-0 PS2 18 (SUTURE) ×3 IMPLANT
SUT VIC AB 0 CT1 27 (SUTURE) ×4
SUT VIC AB 0 CT1 27XBRD ANBCTR (SUTURE) ×2 IMPLANT
SUT VIC AB 1 CT1 27 (SUTURE) ×10
SUT VIC AB 1 CT1 27XBRD ANBCTR (SUTURE) ×5 IMPLANT
SUT VIC AB 2-0 CT1 27 (SUTURE) ×4
SUT VIC AB 2-0 CT1 TAPERPNT 27 (SUTURE) ×2 IMPLANT
SYR 30ML LL (SYRINGE) ×9 IMPLANT
SYR TB 1ML LUER SLIP (SYRINGE) ×3 IMPLANT
TOWEL GREEN STERILE (TOWEL DISPOSABLE) ×6 IMPLANT
TOWEL GREEN STERILE FF (TOWEL DISPOSABLE) ×6 IMPLANT
TRAY CATH 16FR W/PLASTIC CATH (SET/KITS/TRAYS/PACK) ×3 IMPLANT
TRI CRUC RET FEM SZ5 KNEE (Orthopedic Implant) ×3 IMPLANT
WATER STERILE IRR 1000ML POUR (IV SOLUTION) ×3 IMPLANT

## 2019-03-14 NOTE — Anesthesia Procedure Notes (Signed)
Anesthesia Regional Block: Adductor canal block   Pre-Anesthetic Checklist: ,, timeout performed, Correct Patient, Correct Site, Correct Laterality, Correct Procedure, Correct Position, site marked, Risks and benefits discussed, pre-op evaluation,  At surgeon's request and post-op pain management  Laterality: Right  Prep: Maximum Sterile Barrier Precautions used, chloraprep       Needles:  Injection technique: Single-shot  Needle Type: Echogenic Stimulator Needle     Needle Length: 9cm  Needle Gauge: 21     Additional Needles:   Procedures:,,,, ultrasound used (permanent image in chart),,,,  Narrative:  Start time: 03/14/2019 10:45 AM End time: 03/14/2019 10:50 AM Injection made incrementally with aspirations every 5 mL.  Performed by: Personally  Anesthesiologist: Roberts Gaudy, MD  Additional Notes: 0.75% Ropivacaine injected easily

## 2019-03-14 NOTE — Brief Op Note (Cosign Needed Addendum)
03/14/2019    03/14/2019  3:28 PM  PATIENT:  Mark Farley.  73 y.o. male  PRE-OPERATIVE DIAGNOSIS:  RIGHT KNEE OSTEOARTHRITIS  POST-OPERATIVE DIAGNOSIS:  RIGHT KNEE OSTEOARTHRITIS  PROCEDURE:  Procedure(s): RIGHT TOTAL KNEE ARTHROPLASTY  SURGEON:  Surgeon(s): Marlou Sa, Tonna Corner, MD  ASSISTANT: magnant pa  ANESTHESIA:   spinal  EBL: 75 ml    Total I/O In: 1000 [I.V.:1000] Out: 375 [Urine:300; Blood:75]  BLOOD ADMINISTERED: none  DRAINS: none   LOCAL MEDICATIONS USED: Marcaine morphine clonidine Exparel   SPECIMEN:  No Specimen  COUNTS:  YES  TOURNIQUET:   Total Tourniquet Time Documented: Thigh (Right) - 80 minutes Total: Thigh (Right) - 80 minutes  [id number] DICTATION: .Other Dictation: Dictation Number PLAN OF CARE: Admit for overnight observation  PATIENT DISPOSITION:  PACU - hemodynamically stable

## 2019-03-14 NOTE — Anesthesia Postprocedure Evaluation (Signed)
Anesthesia Post Note  Patient: Mark Farley.  Procedure(s) Performed: RIGHT TOTAL KNEE ARTHROPLASTY (Right Knee)     Patient location during evaluation: PACU Anesthesia Type: MAC Level of consciousness: oriented and awake and alert Pain management: pain level controlled Vital Signs Assessment: post-procedure vital signs reviewed and stable Respiratory status: spontaneous breathing, respiratory function stable and patient connected to nasal cannula oxygen Cardiovascular status: blood pressure returned to baseline and stable Postop Assessment: no headache, no backache and no apparent nausea or vomiting Anesthetic complications: no    Last Vitals:  Vitals:   03/14/19 1624 03/14/19 1647  BP: 103/81 133/74  Pulse: (!) 58 (!) 57  Resp: 13 10  Temp:    SpO2: 99% 98%    Last Pain:  Vitals:   03/14/19 1647  TempSrc:   PainSc: 0-No pain                 Leisel Pinette COKER

## 2019-03-14 NOTE — Transfer of Care (Signed)
Immediate Anesthesia Transfer of Care Note  Patient: Mark Farley.  Procedure(s) Performed: RIGHT TOTAL KNEE ARTHROPLASTY (Right Knee)  Patient Location: PACU  Anesthesia Type:MAC and Regional  Level of Consciousness: drowsy  Airway & Oxygen Therapy: Patient Spontanous Breathing and Patient connected to nasal cannula oxygen  Post-op Assessment: Report given to RN  Post vital signs: Reviewed and stable  Last Vitals:  Vitals Value Taken Time  BP    Temp    Pulse 54 03/14/19 1536  Resp 9 03/14/19 1536  SpO2 96 % 03/14/19 1536  Vitals shown include unvalidated device data.  Last Pain:  Vitals:   03/14/19 1130  TempSrc:   PainSc: 0-No pain      Patients Stated Pain Goal: 3 (29/57/47 3403)  Complications: No apparent anesthesia complications

## 2019-03-14 NOTE — Op Note (Signed)
NAME: Sylvan, Sookdeo MEDICAL RECORD HW:[id number] ACCOUNT [id number] DATE OF BIRTH:1945/06/22 FACILITY: [facility] LOCATION: [facility] PHYSICIAN:Demarus Latterell Randel Pigg, MD  OPERATIVE REPORT  DATE OF PROCEDURE:  03/14/2019  PREOPERATIVE DIAGNOSIS:  Right knee arthritis.  POSTOPERATIVE DIAGNOSIS:  Right knee arthritis.  PROCEDURE:  Right total knee replacement using Stryker press-fit components, cruciate retaining size 5 femur, 6 tibia, 9 mm deep dish insert and a press-fit 35 mm 3-peg patella.  SURGEON:  Meredith Pel, MD  ASSISTANT:  Annie Main, PA.  INDICATIONS:  The patient is a 73 year old patient with end-stage right knee arthritis who presents for operative management after explanation of risks and benefits.  PROCEDURE IN DETAIL:  The patient was brought to the operating room where spinal anesthetic was induced.  Preoperative antibiotics administered.  Timeout was called.  Right leg prescrubbed with alcohol and Betadine, allowed to air dry.  Prep with  DuraPrep solution and draped in a sterile manner.  Ioban used to cover the operative field.  Leg was elevated and exsanguinated with the Esmarch wrap.  Tourniquet was inflated.  Anterior approach to knee was made.  Skin and subcutaneous tissue were  sharply divided.  Median parapatellar arthrotomy was made and marked with #1 Vicryl suture superior to the patella.  Fat pad partially excised.  Minimal medial soft tissue dissection was performed.  The lateral patellofemoral ligament was released.  The  tissue anterior distal femur was removed.  At this time, the patient had severe end-stage arthritis in the patellofemoral joint, particularly on the patella with eburnated bone present.  There were also patchy bone-on-bone changes within the lateral and  medial femoral condyles.  The anterior horn of the lateral meniscus was removed.  Retractors placed.  ACL resected.  At this time, intramedullary alignment was then used to make a cut  perpendicular to mechanical axis on the right knee.  Collaterals were  protected and posterior neurovascular structures were protected.  Bone quality was excellent.  Intramedullary alignment was then used to cut the femur in 5 degrees of valgus.  An 8 mm cut was made off of the distal femur, 9 mm cut off the least affected  lateral tibial plateau was made on the tibia.  With those cuts made, a 9 mm spacer block fit well in extension with full extension achieved and good stability present.  Femur sized to a size 5.  It was cut with the anterior, posterior and chamfer cuts  made.  The tibia was keel punched.  Trial components placed and the patient had full extension, full flexion with minimal liftoff with a 9 mm spacer present.  Patella was then cut down from 23-13.  A 3-peg patellar trial was placed.  The patient had good  patellar tracking.  A small lateral release was performed to improve it.  However, the patella did track well with no thumbs technique and remained in the groove even without the release.  At this time, trial components were removed.  Thorough  irrigation was performed.  Exparel and Marcaine saline solution was injected into the capsule.  Tranexamic acid topical sponge allowed to sit for about 3 minutes.  Three liters of irrigating solution was utilized prior to that.  IrriSept was used  throughout the case both at the time of the incision as well as arthrotomy to prevent infections.  At this time, components were press-fit into position with excellent press-fit achieved.  Patellar tracking remained good.  The patient had very good  stability to varus and  valgus stress at 0, 30 and 90 degrees.  Tourniquet released.  Bleeding points encountered controlled using electrocautery.  Arthrotomy was closed using #1 Vicryl suture with leg in low flexion.  Then, the incision was closed using  0 Vicryl suture, 2-0 Vicryl suture and 3-0 Monocryl.  The patient then placed Steri-Strips and Aquacel  dressing placed.  Knee immobilizer placed.  It should be noted that prior to placing the Aquacel dressing, a solution of Marcaine, morphine, clonidine  was injected into the knee for postop pain relief.  The patient tolerated the procedure well without immediate complications.  Luke's assistance was required at all times during the case for retraction, opening and closing.  His assistance was of medical  necessity.  TN/NUANCE  D:03/14/2019 T:03/14/2019 JOB:[id number]

## 2019-03-14 NOTE — Anesthesia Procedure Notes (Signed)
Spinal  Patient location during procedure: OR Start time: 03/14/2019 10:45 AM End time: 03/14/2019 10:55 AM Staffing Anesthesiologist: Roberts Gaudy, MD Preanesthetic Checklist Completed: patient identified, site marked, surgical consent, pre-op evaluation, timeout performed, IV checked, risks and benefits discussed and monitors and equipment checked Spinal Block Patient position: sitting Prep: DuraPrep Patient monitoring: heart rate, cardiac monitor, continuous pulse ox and blood pressure Approach: midline Location: L3-4 Injection technique: single-shot Needle Needle type: Sprotte and Pencan  Needle gauge: 24 G Needle length: 9 cm Assessment Sensory level: T8 Additional Notes 1.6 cc 0.75% Bupivacaine injected easily

## 2019-03-14 NOTE — Progress Notes (Signed)
Orthopedic Tech Progress Note Patient Details:  Mark Farley. 1946/05/14 [id number]  CPM Right Knee CPM Right Knee: On Right Knee Flexion (Degrees): 40 Right Knee Extension (Degrees): 10 Additional Comments: foot roll  Post Interventions Patient Tolerated: Well Instructions Provided: Care of device  [name] 03/14/2019, 4:43 PM

## 2019-03-14 NOTE — H&P (Signed)
TOTAL KNEE ADMISSION H&P  Patient is being admitted for right total knee arthroplasty.  Subjective:  Chief Complaint:right knee pain.  HPI: Mark Pillownthony J Abraha Jr., 73 y.o. male, has a history of pain and functional disability in the right knee due to arthritis and has failed non-surgical conservative treatments for greater than 12 weeks to includeNSAID's and/or analgesics, corticosteriod injections, viscosupplementation injections, flexibility and strengthening excercises and activity modification.  Onset of symptoms was gradual, starting 10 years ago with gradually worsening course since that time. The patient noted no past surgery on the right knee(s).  Patient currently rates pain in the right knee(s) at 9 out of 10 with activity. Patient has night pain, worsening of pain with activity and weight bearing, pain that interferes with activities of daily living, pain with passive range of motion, crepitus and joint swelling.  Patient has evidence of subchondral sclerosis and joint space narrowing by imaging studies. This patient has had Hemoglobin history of treatment nonoperatively for this knee arthritis.  Patient is very active and wants to maintain an active lifestyle.  He is in very good physical condition with excellent quad strength bilaterally.. There is no active infection.  Patient Active Problem List   Diagnosis Date Noted  . Educated about COVID-19 virus infection 09/25/2018  . Erectile dysfunction 09/25/2018  . Intention tremor 03/09/2017  . Tingling of left upper extremity 03/09/2017  . Runner's knee, right 03/09/2017  . Hepatic steatosis 03/09/2017  . Hearing loss due to cerumen impaction 03/08/2017  . Encounter for preventive health examination 05/26/2015  . SVT (supraventricular tachycardia) (HCC) 02/07/2015  . History of syncope 03/06/2014  . PVC's (premature ventricular contractions) 03/06/2014  . Osteoarthritis 02/08/2012  . Prediabetes 08/06/2011  . Overweight (BMI  25.0-29.9) 02/06/2011  . Family history of colon cancer requiring screening colonoscopy 02/05/2011  . Mixed hyperlipidemia 08/14/2008  . HYPERTENSION, BENIGN 08/14/2008   Past Medical History:  Diagnosis Date  . Arthritis    ? Gout  . Bilateral carpal tunnel syndrome    managed with nocturnal braces x 3 years  . COPD (chronic obstructive pulmonary disease) (HCC)   . Dysrhythmia    Tachycardia, PVCs, SVTs  . GERD (gastroesophageal reflux disease)   . Glucose intolerance (impaired glucose tolerance)   . Heart murmur    as a child  . History of syncope 2005   Unexplained, myoview negative  . Hyperlipidemia   . Hypertension   . Iron deficiency anemia   . Osteoarthritis of multiple joints   . Palpitations 2010   Life Watch with single PVC  . Pre-diabetes     Past Surgical History:  Procedure Laterality Date  . APPENDECTOMY     1957  . COLONOSCOPY    . TONSILLECTOMY     1952    Current Facility-Administered Medications  Medication Dose Route Frequency Provider Last Rate Last Dose  . ceFAZolin (ANCEF) 2-4 GM/100ML-% IVPB           . ceFAZolin (ANCEF) IVPB 2g/100 mL premix  2 g Intravenous On Call to OR Magnant, Charles L, PA-C      . chlorhexidine (HIBICLENS) 4 % liquid 4 application  60 mL Topical Once Magnant, Charles L, PA-C      . chlorhexidine (HIBICLENS) 4 % liquid 4 application  60 mL Topical Once Magnant, Charles L, PA-C      . lactated ringers infusion   Intravenous Continuous Kipp BroodJoslin, David, MD      . povidone-iodine 10 % swab 2 application  2  application Topical Once Magnant, Charles L, PA-C      . tranexamic acid (CYKLOKAPRON) 1000MG /194mL IVPB           . tranexamic acid (CYKLOKAPRON) IVPB 1,000 mg  1,000 mg Intravenous To OR Magnant, Charles L, PA-C       Allergies  Allergen Reactions  . Bee Venom Swelling    Social History   Tobacco Use  . Smoking status: Never Smoker  . Smokeless tobacco: Never Used  Substance Use Topics  . Alcohol use: Yes     Alcohol/week: 14.0 standard drinks    Types: 14 Standard drinks or equivalent per week    Comment: 2 glasses of wine a day    Family History  Problem Relation Age of Onset  . Coronary artery disease Mother   . Heart attack Mother        MI  . Heart failure Father        CHF  . Colon cancer Brother   . Colon cancer Paternal Grandmother      Review of Systems  Musculoskeletal: Positive for joint pain.  All other systems reviewed and are negative.   Objective:  Physical Exam  Constitutional: He appears well-developed.  HENT:  Head: Normocephalic.  Eyes: Pupils are equal, round, and reactive to light.  Neck: Normal range of motion.  Cardiovascular: Normal rate.  Respiratory: Effort normal.  Neurological: He is alert.  Skin: Skin is warm.  Psychiatric: He has a normal mood and affect.  Examination of the right knee demonstrates intact skin.  Has about 5 slightly less than 10 degree flexion contracture.  Flexion is past 90 to about 105.  Pedal pulses palpable.  Collaterals are stable.  Extensor mechanism is intact.  Vital signs in last 24 hours: Temp:  [98.7 F (37.1 C)] 98.7 F (37.1 C) (10/13 0959) Pulse Rate:  [58-71] 58 (10/13 1130) Resp:  [12-20] 12 (10/13 1130) BP: (147-182)/(82-102) 149/82 (10/13 1130) SpO2:  [95 %-100 %] 99 % (10/13 1130) Weight:  [80.8 kg] 80.8 kg (10/13 0959)  Labs:   Estimated body mass index is 27.9 kg/m as calculated from the following:   Height as of this encounter: 5\' 7"  (1.702 m).   Weight as of this encounter: 80.8 kg.   Imaging Review Plain radiographs demonstrate severe degenerative joint disease of the right knee(s). The overall alignment ismild varus. The bone quality appears to be excellent for age and reported activity level.      Assessment/Plan:  End stage arthritis, right knee   The patient history, physical examination, clinical judgment of the provider and imaging studies are consistent with end stage degenerative  joint disease of the right knee(s) and total knee arthroplasty is deemed medically necessary. The treatment options including medical management, injection therapy arthroscopy and arthroplasty were discussed at length. The risks and benefits of total knee arthroplasty were presented and reviewed. The risks due to aseptic loosening, infection, stiffness, patella tracking problems, thromboembolic complications and other imponderables were discussed. The patient acknowledged the explanation, agreed to proceed with the plan and consent was signed. Patient is being admitted for inpatient treatment for surgery, pain control, PT, OT, prophylactic antibiotics, VTE prophylaxis, progressive ambulation and ADL's and discharge planning. The patient is planning to be discharged home with home health services     Patient's anticipated LOS is less than 2 midnights, meeting these requirements: - Younger than 82 - Lives within 1 hour of care - Has a competent adult at home to recover with  post-op recover - NO history of  - Chronic pain requiring opiods  - Diabetes  - Coronary Artery Disease  - Heart failure  - Heart attack  - Stroke  - DVT/VTE  - Cardiac arrhythmia  - Respiratory Failure/COPD  - Renal failure  - Anemia  - Advanced Liver disease

## 2019-03-15 ENCOUNTER — Encounter (HOSPITAL_COMMUNITY): Payer: Self-pay | Admitting: Orthopedic Surgery

## 2019-03-15 DIAGNOSIS — Z91030 Bee allergy status: Secondary | ICD-10-CM | POA: Diagnosis not present

## 2019-03-15 DIAGNOSIS — M1711 Unilateral primary osteoarthritis, right knee: Secondary | ICD-10-CM | POA: Diagnosis present

## 2019-03-15 DIAGNOSIS — J449 Chronic obstructive pulmonary disease, unspecified: Secondary | ICD-10-CM | POA: Diagnosis present

## 2019-03-15 DIAGNOSIS — E782 Mixed hyperlipidemia: Secondary | ICD-10-CM | POA: Diagnosis present

## 2019-03-15 DIAGNOSIS — K219 Gastro-esophageal reflux disease without esophagitis: Secondary | ICD-10-CM | POA: Diagnosis present

## 2019-03-15 DIAGNOSIS — I10 Essential (primary) hypertension: Secondary | ICD-10-CM | POA: Diagnosis present

## 2019-03-15 DIAGNOSIS — Z8249 Family history of ischemic heart disease and other diseases of the circulatory system: Secondary | ICD-10-CM | POA: Diagnosis not present

## 2019-03-15 LAB — GLUCOSE, CAPILLARY: Glucose-Capillary: 153 mg/dL — ABNORMAL HIGH (ref 70–99)

## 2019-03-15 NOTE — Progress Notes (Signed)
  Subjective: Mark Kohen. is a 72 y.o. male s/p Right TKA.  They are POD1.  Pt's pain is controlled.  Pt denies numbness/tingling/weakness.  Pt has not ambulated yet.     Objective: Vital signs in last 24 hours: Temp:  [97.5 F (36.4 C)-98.8 F (37.1 C)] 98.8 F (37.1 C) (10/14 0724) Pulse Rate:  [53-75] 75 (10/14 0724) Resp:  [10-20] 17 (10/14 0404) BP: (78-182)/(47-102) 135/73 (10/14 0724) SpO2:  [94 %-100 %] 94 % (10/14 0724) Weight:  [80.8 kg] 80.8 kg (10/13 0959)  Intake/Output from previous day: 10/13 0701 - 10/14 0700 In: 1700 [I.V.:1700] Out: 1925 [Urine:1850; Blood:75] Intake/Output this shift: No intake/output data recorded.  Exam:  No gross blood or drainage overlying the dressing 2+ DP pulse Sensation intact distally in the right foot Able to dorsiflex and plantarflex the right foot   Labs: No results for input(s): HGB in the last 72 hours. No results for input(s): WBC, RBC, HCT, PLT in the last 72 hours. No results for input(s): NA, K, CL, CO2, BUN, CREATININE, GLUCOSE, CALCIUM in the last 72 hours. No results for input(s): LABPT, INR in the last 72 hours.  Assessment/Plan: Pt is POD1 s/p right TKA.    -Plan to discharge to home today or tomorrow pending patient's pain and PT eval  -WBAT with a walker  -Okay to shower, dressing is waterproof.  Cautioned patient against soaking dressing in bath/pool/body of water  -Encouraged the use of the blue cradle boot to work on extension.  Cautioned patient against using a pillow under their knee.  -Use the CPM machine at least 3 times per day for one hour each time, increasing the degrees daily.     Mark Farley 03/15/2019, 8:38 AM

## 2019-03-15 NOTE — Progress Notes (Signed)
Physical Therapy Treatment Patient Details Name: Mark Farley. MRN: [id number] DOB: 01/18/1946 Today's Date: 03/15/2019    History of Present Illness Pt is a 73 y/o male s/p elective R TKA. PMH including but not limited to COPD and HTN.    PT Comments    Pt making steady progress with functional mobility and tolerated ambulating a short distance in the hallway with min guard and use of RW. Plan for stair training at next session as appropriate. Pt would continue to benefit from skilled physical therapy services at this time while admitted and after d/c to address the below listed limitations in order to improve overall safety and independence with functional mobility.   Follow Up Recommendations  Home health PT;Supervision/Assistance - 24 hour     Equipment Recommendations  3in1 (PT)    Recommendations for Other Services       Precautions / Restrictions Precautions Precautions: Fall;Knee Precaution Booklet Issued: No Precaution Comments: reviewed positioning of LE following knee surgery with pt and pt's spouse Restrictions Weight Bearing Restrictions: Yes RLE Weight Bearing: Weight bearing as tolerated    Mobility  Bed Mobility Overal bed mobility: Needs Assistance Bed Mobility: Supine to Sit;Sit to Supine     Supine to sit: Min assist Sit to supine: Min assist   General bed mobility comments: assistance needed for R LE movement off of and onto bed  Transfers Overall transfer level: Needs assistance Equipment used: Rolling walker (2 wheeled) Transfers: Sit to/from Stand Sit to Stand: Min guard         General transfer comment: cueing for safe hand placement, min guard with transitional movement into standing; cueing for more upright posture  Ambulation/Gait Ambulation/Gait assistance: Min guard Gait Distance (Feet): 50 Feet Assistive device: Rolling walker (2 wheeled) Gait Pattern/deviations: Step-to pattern;Decreased step length - right;Decreased  step length - left;Decreased stride length;Decreased stance time - right;Decreased weight shift to right;Antalgic Gait velocity: decreased   General Gait Details: pt continuing with step-to gait pattern, overall steady with RW, required several standing rest breaks secondary to pain and fatigue; min guard for safety   Stairs             Wheelchair Mobility    Modified Rankin (Stroke Patients Only)       Balance Overall balance assessment: Needs assistance Sitting-balance support: Feet supported Sitting balance-Leahy Scale: Good     Standing balance support: Single extremity supported;Bilateral upper extremity supported Standing balance-Leahy Scale: Poor                              Cognition Arousal/Alertness: Awake/alert Behavior During Therapy: WFL for tasks assessed/performed Overall Cognitive Status: Within Functional Limits for tasks assessed                                        Exercises Total Joint Exercises Long Arc Quad: AAROM;AROM;Right;10 reps;Seated Knee Flexion: AAROM;Right;10 reps;Seated Goniometric ROM: Flexion = ~80 degrees; Extension = lacking 10 degrees to neutral Marching in Standing: Seated;AROM;Both;10 reps    General Comments        Pertinent Vitals/Pain Pain Assessment: Faces Faces Pain Scale: Hurts even more Pain Location: R knee Pain Descriptors / Indicators: Aching Pain Intervention(s): Monitored during session;Repositioned;Premedicated before session    Home Living  Prior Function            PT Goals (current goals can now be found in the care plan section) Acute Rehab PT Goals Patient Stated Goal: decrease pain; go home soon PT Goal Formulation: With patient Time For Goal Achievement: 03/29/19 Potential to Achieve Goals: Good Progress towards PT goals: Progressing toward goals    Frequency    7X/week      PT Plan Current plan remains appropriate     Co-evaluation              AM-PAC PT "6 Clicks" Mobility   Outcome Measure  Help needed turning from your back to your side while in a flat bed without using bedrails?: A Little Help needed moving from lying on your back to sitting on the side of a flat bed without using bedrails?: A Little Help needed moving to and from a bed to a chair (including a wheelchair)?: A Little Help needed standing up from a chair using your arms (e.g., wheelchair or bedside chair)?: A Little Help needed to walk in hospital room?: A Little Help needed climbing 3-5 steps with a railing? : A Lot 6 Click Score: 17    End of Session Equipment Utilized During Treatment: Gait belt Activity Tolerance: Patient tolerated treatment well Patient left: in bed;with call bell/phone within reach Nurse Communication: Mobility status PT Visit Diagnosis: Other abnormalities of gait and mobility (R26.89);Pain Pain - Right/Left: Right Pain - part of body: Knee     Time: 5027-7412 PT Time Calculation (min) (ACUTE ONLY): 39 min  Charges:  $Gait Training: 23-37 mins $Therapeutic Exercise: 8-22 mins                     Deborah Chalk, PT, DPT  Acute Rehabilitation Services Pager [phone] Office [phone]     Alessandra Bevels Torria Fromer 03/15/2019, 2:11 PM

## 2019-03-15 NOTE — TOC Transition Note (Signed)
Transition of Care [facility]) - CM/SW Discharge Note   Patient Details  Name: Mark Farley. MRN: [id number] Date of Birth: 1946-04-08  Transition of Care [facility]) CM/SW Contact:  Midge Minium RN, BSN, NCM-BC, ACM-RN [phone] Phone Number: 03/15/2019, 1:29 PM   Clinical Narrative:    CM following for transitional needs. CM spoke to the patient to discuss the POC. Patient lives at home with his spouse and was independent with his ADLs PTA. Patient is s/p R TKA. PT eval complete with HH/DME recommended. HHPT was arranged preoperatively with [facility]; CPM, FWW and BSC was arranged preoperatively and per patient, has been delivered to his home. No further needs from CM.    Final next level of care: [place] Barriers to Discharge: Continued Medical Work up   Patient Goals and CMS Choice Patient states their goals for this hospitalization and ongoing recovery are:: "to go home soon" CMS Medicare.gov Compare Post Acute Care list provided to:: Other (Comment Required)(HH/DME was arranged by his Ortho PTA) Choice offered to / list presented to : NA(HH/DME was arranged by his Ortho PTA)  Discharge Placement                       Discharge Plan and Services                DME Arranged: Other see comment, CPM, Bedside commode, Walker rolling(Arranged preoperatively with [facility]) DME Agency: Other - Comment, [facility](Arranged preoperatively with [facility])       HH Arranged: PT(Arranged preoperatively with [facility]) [place]: [facility]Arranged preoperatively with [facility]) Date [place]: 03/15/19 Time [place]: [place] Representative spoke with at [place]: [place]  Social Determinants of Health ([place]) Interventions     Readmission Risk Interventions No flowsheet data found.

## 2019-03-15 NOTE — Evaluation (Signed)
Physical Therapy Evaluation Patient Details Name: Mark Farley. MRN: [id number] DOB: Feb 26, 1946 Today's Date: 03/15/2019   History of Present Illness  Pt is a 73 y/o male s/p elective R TKA. PMH including but not limited to COPD and HTN.  Clinical Impression  Pt presented supine in bed with HOB elevated, awake and willing to participate in therapy session. Prior to admission, pt reported that he was independent with all functional mobility and ADLs. Pt lives with his wife in a two level home (able to live on main level) with four steps to enter (no railing). Pt will have 24/7 supervision/assistance upon d/c if needed. At the time of evaluation, pt greatly limited secondary to pain and feeling "woozy" with standing and ambulation. Pt needing to sit several minutes and performed LE therex prior to feeling better. Plan for further gait training at next session as appropriate. Pt would continue to benefit from skilled physical therapy services at this time while admitted and after d/c to address the below listed limitations in order to improve overall safety and independence with functional mobility.  Pt on RA throughout with SPO2 at 99%, pulse rate at 52 and BP after ambulation was 102/64 mmHg (seated position).     Follow Up Recommendations Home health PT;Supervision/Assistance - 24 hour    Equipment Recommendations  3in1 (PT)    Recommendations for Other Services       Precautions / Restrictions Precautions Precautions: Fall;Knee Restrictions Weight Bearing Restrictions: Yes RLE Weight Bearing: Weight bearing as tolerated      Mobility  Bed Mobility Overal bed mobility: Needs Assistance Bed Mobility: Supine to Sit     Supine to sit: Min guard     General bed mobility comments: increased time and effort, min guard for safety; pt achieving upright sitting position towards his L side  Transfers Overall transfer level: Needs assistance Equipment used: Rolling walker (2  wheeled) Transfers: Sit to/from Stand Sit to Stand: Min guard         General transfer comment: cueing for safe hand placement, min guard with transitional movement into standing; cueing for more upright posture  Ambulation/Gait Ambulation/Gait assistance: Min guard Gait Distance (Feet): 2 Feet Assistive device: Rolling walker (2 wheeled) Gait Pattern/deviations: Step-to pattern;Decreased step length - right;Decreased step length - left;Decreased stride length;Antalgic Gait velocity: decreased   General Gait Details: pt with very short, antalgic steps bilaterally with difficulty advancing R LE; pt becoming very "woozy" after ambulating ~2', did not improve with standing rest break and required a sitting break  Stairs            Wheelchair Mobility    Modified Rankin (Stroke Patients Only)       Balance Overall balance assessment: Needs assistance Sitting-balance support: Feet supported Sitting balance-Leahy Scale: Good     Standing balance support: Single extremity supported;Bilateral upper extremity supported Standing balance-Leahy Scale: Poor                               Pertinent Vitals/Pain Pain Assessment: 0-10 Pain Score: 4  Pain Location: R knee Pain Descriptors / Indicators: Aching Pain Intervention(s): Monitored during session;Repositioned    Home Living Family/patient expects to be discharged to:: Private residence Living Arrangements: Spouse/significant other Available Help at Discharge: Family;Available 24 hours/day Type of Home: House Home Access: Stairs to enter Entrance Stairs-Rails: None Entrance Stairs-Number of Steps: 4 Home Layout: Able to live on main level with bedroom/bathroom;1/2 bath  on main level Home Equipment: Clinical cytogeneticist - 2 wheels      Prior Function Level of Independence: Independent               Hand Dominance   Dominant Hand: Right    Extremity/Trunk Assessment   Upper Extremity  Assessment Upper Extremity Assessment: Overall WFL for tasks assessed    Lower Extremity Assessment Lower Extremity Assessment: RLE deficits/detail RLE Deficits / Details: pt with decreased strength and ROM limitations secondary to post-op pain and weakness. Sensation to light touch WNL throughout.        Communication   Communication: No difficulties  Cognition Arousal/Alertness: Awake/alert Behavior During Therapy: WFL for tasks assessed/performed Overall Cognitive Status: Within Functional Limits for tasks assessed                                        General Comments      Exercises Total Joint Exercises Ankle Circles/Pumps: AROM;Both;20 reps;Seated Quad Sets: AROM;Strengthening;Right;10 reps;Seated Heel Slides: AAROM;Right;10 reps;Seated   Assessment/Plan    PT Assessment Patient needs continued PT services  PT Problem List Decreased strength;Decreased range of motion;Decreased balance;Decreased mobility;Decreased activity tolerance;Decreased coordination;Decreased knowledge of use of DME;Decreased safety awareness;Decreased knowledge of precautions;Pain       PT Treatment Interventions DME instruction;Gait training;Stair training;Functional mobility training;Therapeutic activities;Therapeutic exercise;Balance training;Neuromuscular re-education;Patient/family education    PT Goals (Current goals can be found in the Care Plan section)  Acute Rehab PT Goals Patient Stated Goal: decrease pain; go home soon PT Goal Formulation: With patient Time For Goal Achievement: 03/29/19 Potential to Achieve Goals: Good    Frequency 7X/week   Barriers to discharge        Co-evaluation               AM-PAC PT "6 Clicks" Mobility  Outcome Measure Help needed turning from your back to your side while in a flat bed without using bedrails?: None Help needed moving from lying on your back to sitting on the side of a flat bed without using bedrails?:  None Help needed moving to and from a bed to a chair (including a wheelchair)?: A Little Help needed standing up from a chair using your arms (e.g., wheelchair or bedside chair)?: A Little Help needed to walk in hospital room?: A Little Help needed climbing 3-5 steps with a railing? : A Lot 6 Click Score: 19    End of Session Equipment Utilized During Treatment: Gait belt Activity Tolerance: Patient limited by pain;Patient limited by fatigue Patient left: in chair;with call bell/phone within reach Nurse Communication: Mobility status PT Visit Diagnosis: Other abnormalities of gait and mobility (R26.89);Pain Pain - Right/Left: Right Pain - part of body: Knee    Time: 0826-0859 PT Time Calculation (min) (ACUTE ONLY): 33 min   Charges:   PT Evaluation $PT Eval Moderate Complexity: 1 Mod PT Treatments $Therapeutic Activity: 8-22 mins        Sherie Don, PT, DPT  Acute Rehabilitation Services Pager [phone] Office [place] 03/15/2019, 10:15 AM

## 2019-03-15 NOTE — Progress Notes (Signed)
Patient doing well.  Had some dizziness when he got up today. Right knee is doing well in the CPM Dressing is dry Plan is for therapy this afternoon and then we should be able to get him discharged tomorrow late morning

## 2019-03-15 NOTE — Care Management (Signed)
CM consult acknowledged to assist with any HH/DME needs. Awaiting PT/OT eval for DCP recommendations and will continue to follow.  Catricia Scheerer RN, BSN, NCM-BC, ACM-RN [phone] 

## 2019-03-15 NOTE — Care Management Obs Status (Signed)
[place] NOTIFICATION   Patient Details  Name: Mark Farley. MRN: [id number] Date of Birth: 1945/11/28   Medicare Observation Status Notification Given:  Yes    Midge Minium RN, BSN, NCM-BC, ACM-RN [phone] 03/15/2019, 1:23 PM

## 2019-03-16 MED ORDER — POLYETHYLENE GLYCOL 3350 17 GM/SCOOP PO POWD
1.0000 | Freq: Once | ORAL | Status: AC
  Administered 2019-03-16: 10:00:00 255 g via ORAL
  Filled 2019-03-16: qty 255

## 2019-03-16 MED ORDER — ASPIRIN 81 MG PO CHEW: 81.0000 mg | CHEWABLE_TABLET | Freq: Every day | ORAL | 0 refills | Status: DC

## 2019-03-16 MED ORDER — METHOCARBAMOL 500 MG PO TABS: 500.0000 mg | ORAL_TABLET | Freq: Three times a day (TID) | ORAL | 0 refills | Status: DC | PRN

## 2019-03-16 MED ORDER — OXYCODONE HCL 5 MG PO TABS: 5.0000 mg | ORAL_TABLET | ORAL | 0 refills | Status: DC | PRN

## 2019-03-16 NOTE — Progress Notes (Signed)
Pt given discharge instructions and gone over with him and wife present. Both verbalized understanding of instructions. All questions answered. All belongings gathered to be sent home.

## 2019-03-16 NOTE — Progress Notes (Signed)
Physical Therapy Treatment Patient Details Name: Mark Farley. MRN: [id number] DOB: [birth year] Today's Date: 03/16/2019    History of Present Illness Pt is a 73 y/o male s/p elective R TKA. PMH including but not limited to COPD and HTN.    PT Comments    Pt making steady progress with mobility. He tolerated stair training with spouse present throughout and assisting appropriately. Pt would continue to benefit from skilled physical therapy services at this time while admitted and after d/c to address the below listed limitations in order to improve overall safety and independence with functional mobility.    Follow Up Recommendations  Home health PT;Supervision/Assistance - 24 hour     Equipment Recommendations  3in1 (PT)    Recommendations for Other Services       Precautions / Restrictions Precautions Precautions: Fall;Knee Precaution Comments: reviewed positioning of LE following knee surgery with pt and pt's spouse Restrictions Weight Bearing Restrictions: Yes RLE Weight Bearing: Weight bearing as tolerated    Mobility  Bed Mobility Overal bed mobility: Needs Assistance Bed Mobility: Supine to Sit     Supine to sit: Min assist     General bed mobility comments: min A for R LE management off of bed for the last 25%; pt able to use bilateral UEs initially  Transfers Overall transfer level: Needs assistance Equipment used: Rolling walker (2 wheeled) Transfers: Sit to/from Stand Sit to Stand: Min assist         General transfer comment: frequent cueing for safe hand placement and technique; performed x1 from EOB and x2 from recliner chair  Ambulation/Gait Ambulation/Gait assistance: Min guard Gait Distance (Feet): 20 Feet Assistive device: Rolling walker (2 wheeled) Gait Pattern/deviations: Step-to pattern;Decreased step length - right;Decreased step length - left;Decreased stride length;Decreased stance time - right;Decreased weight shift to  right;Antalgic Gait velocity: decreased   General Gait Details: pt overall steady with RW with slow, antalgic gait pattern; no overt LOB or need for physical assistance, min guard for safety   Stairs Stairs: Yes Stairs assistance: Min assist Stair Management: No rails;Step to pattern;Backwards;With walker Number of Stairs: 2(x2 trials) General stair comments: PT provided instruction and demonstration to pt and pt's spouse. Performed first trial with PT and second trial with spouse. Cueing for sequencing and min A to stabilize front of RW   Wheelchair Mobility    Modified Rankin (Stroke Patients Only)       Balance Overall balance assessment: Needs assistance Sitting-balance support: Feet supported Sitting balance-Leahy Scale: Good     Standing balance support: Single extremity supported;Bilateral upper extremity supported Standing balance-Leahy Scale: Poor                              Cognition Arousal/Alertness: Awake/alert Behavior During Therapy: WFL for tasks assessed/performed Overall Cognitive Status: Within Functional Limits for tasks assessed                                        Exercises      General Comments        Pertinent Vitals/Pain Pain Assessment: Faces Faces Pain Scale: Hurts even more Pain Location: R knee Pain Descriptors / Indicators: Aching Pain Intervention(s): Monitored during session;Repositioned;Premedicated before session    Home Living  Prior Function            PT Goals (current goals can now be found in the care plan section) Acute Rehab PT Goals PT Goal Formulation: With patient Time For Goal Achievement: 03/29/19 Potential to Achieve Goals: Good Progress towards PT goals: Progressing toward goals    Frequency    7X/week      PT Plan Current plan remains appropriate    Co-evaluation              AM-PAC PT "6 Clicks" Mobility   Outcome Measure   Help needed turning from your back to your side while in a flat bed without using bedrails?: A Little Help needed moving from lying on your back to sitting on the side of a flat bed without using bedrails?: A Little Help needed moving to and from a bed to a chair (including a wheelchair)?: A Little Help needed standing up from a chair using your arms (e.g., wheelchair or bedside chair)?: A Little Help needed to walk in hospital room?: A Little Help needed climbing 3-5 steps with a railing? : A Little 6 Click Score: 18    End of Session Equipment Utilized During Treatment: Gait belt Activity Tolerance: Patient tolerated treatment well Patient left: with call bell/phone within reach;with family/visitor present;Other (comment)(in bathroom on toilet attempting to have a BM) Nurse Communication: Mobility status PT Visit Diagnosis: Other abnormalities of gait and mobility (R26.89);Pain Pain - Right/Left: Right Pain - part of body: Knee     Time: 2703-5009 PT Time Calculation (min) (ACUTE ONLY): 47 min  Charges:  $Gait Training: 23-37 mins $Therapeutic Activity: 8-22 mins                     Deborah Chalk, PT, DPT  Acute Rehabilitation Services Pager [phone] Office [phone]     Alessandra Bevels Shakiyla Kook 03/16/2019, 12:05 PM

## 2019-03-16 NOTE — Progress Notes (Signed)
Physical Therapy Treatment Patient Details Name: Mark Farley. MRN: [id number] DOB: 1945/11/18 Today's Date: 03/16/2019    History of Present Illness Pt is a 73 y/o male s/p elective R TKA. PMH including but not limited to COPD and HTN.    PT Comments    Pt seen for a second session this afternoon and again practiced stair training with spouse. Pt able to progress distance ambulated this session as well. Pt would continue to benefit from skilled physical therapy services at this time while admitted and after d/c to address the below listed limitations in order to improve overall safety and independence with functional mobility.    Follow Up Recommendations  Home health PT;Supervision/Assistance - 24 hour     Equipment Recommendations  3in1 (PT)    Recommendations for Other Services       Precautions / Restrictions Precautions Precautions: Fall;Knee Precaution Comments: reviewed positioning of LE following knee surgery with pt and pt's spouse Restrictions Weight Bearing Restrictions: Yes RLE Weight Bearing: Weight bearing as tolerated    Mobility  Bed Mobility Overal bed mobility: Needs Assistance Bed Mobility: Supine to Sit     Supine to sit: Min assist     General bed mobility comments: min A for R LE management off of bed for the last 25%; pt able to use bilateral UEs initially  Transfers Overall transfer level: Needs assistance Equipment used: Rolling walker (2 wheeled) Transfers: Sit to/from Stand Sit to Stand: Min guard         General transfer comment: cueing again needed for safe hand placement, min guard for safety  Ambulation/Gait Ambulation/Gait assistance: Min guard Gait Distance (Feet): 200 Feet Assistive device: Rolling walker (2 wheeled) Gait Pattern/deviations: Step-to pattern;Step-through pattern;Decreased step length - right;Decreased step length - left;Decreased stance time - right;Decreased stride length;Decreased weight shift to  right;Antalgic Gait velocity: decreased   General Gait Details: pt initially using a step-to pattern, progressing to a step-through gait pattern with cueing and demonstration; pt with mild LOB x2 but able to self correct   Stairs Stairs: Yes Stairs assistance: Min assist Stair Management: No rails;Step to pattern;Backwards;With walker Number of Stairs: 2 General stair comments: pt and pt's spouse practicing stair navigation again with cueing and direction from PT   Wheelchair Mobility    Modified Rankin (Stroke Patients Only)       Balance Overall balance assessment: Needs assistance Sitting-balance support: Feet supported Sitting balance-Leahy Scale: Good     Standing balance support: Single extremity supported;Bilateral upper extremity supported Standing balance-Leahy Scale: Poor                              Cognition Arousal/Alertness: Awake/alert Behavior During Therapy: WFL for tasks assessed/performed Overall Cognitive Status: Within Functional Limits for tasks assessed                                        Exercises      General Comments        Pertinent Vitals/Pain Pain Assessment: Faces Faces Pain Scale: Hurts even more Pain Location: R knee Pain Descriptors / Indicators: Aching;Sore;Tightness Pain Intervention(s): Monitored during session;Repositioned    Home Living                      Prior Function  PT Goals (current goals can now be found in the care plan section) Acute Rehab PT Goals PT Goal Formulation: With patient Time For Goal Achievement: 03/29/19 Potential to Achieve Goals: Good Progress towards PT goals: Progressing toward goals    Frequency    7X/week      PT Plan Current plan remains appropriate    Co-evaluation              AM-PAC PT "6 Clicks" Mobility   Outcome Measure  Help needed turning from your back to your side while in a flat bed without using  bedrails?: A Little Help needed moving from lying on your back to sitting on the side of a flat bed without using bedrails?: A Little Help needed moving to and from a bed to a chair (including a wheelchair)?: A Little Help needed standing up from a chair using your arms (e.g., wheelchair or bedside chair)?: A Little Help needed to walk in hospital room?: A Little Help needed climbing 3-5 steps with a railing? : A Little 6 Click Score: 18    End of Session Equipment Utilized During Treatment: Gait belt Activity Tolerance: Patient tolerated treatment well Patient left: in chair;with call bell/phone within reach;with family/visitor present Nurse Communication: Mobility status PT Visit Diagnosis: Other abnormalities of gait and mobility (R26.89);Pain Pain - Right/Left: Right Pain - part of body: Knee     Time: 5638-7564 PT Time Calculation (min) (ACUTE ONLY): 39 min  Charges:  $Gait Training: 23-37 mins $Therapeutic Activity: 8-22 mins                     Deborah Chalk, PT, DPT  Acute Rehabilitation Services Pager [phone] Office [phone]     Mark Farley 03/16/2019, 3:43 PM

## 2019-03-16 NOTE — Plan of Care (Signed)
  Problem: Health Behavior/Discharge Planning: Goal: Ability to manage health-related needs will improve Outcome: Adequate for Discharge   Problem: Activity: Goal: Risk for activity intolerance will decrease Outcome: Adequate for Discharge   Problem: Pain Managment: Goal: General experience of comfort will improve Outcome: Adequate for Discharge   Problem: Safety: Goal: Ability to remain free from injury will improve Outcome: Adequate for Discharge   

## 2019-03-18 ENCOUNTER — Encounter: Payer: Self-pay | Admitting: Orthopedic Surgery

## 2019-03-19 NOTE — Discharge Summary (Signed)
Physician Discharge Summary      Patient ID: Juanito Gonyer. MRN: [id number] DOB/AGE: Sep 11, 1945 73 y.o.  Admit date: 03/14/2019 Discharge date: 03/16/2019  Admission Diagnoses:  Active Problems:   Arthritis of right knee   Discharge Diagnoses:  Same  Surgeries: Procedure(s): RIGHT TOTAL KNEE ARTHROPLASTY on 03/14/2019   Consultants:   Discharged Condition: Stable  Hospital Course: Stalin Gruenberg. is an 73 y.o. male who was admitted 03/14/2019 with a chief complaint of right knee pain, and found to have a diagnosis of right knee osteoarthritis.  They were brought to the operating room on 03/14/2019 and underwent the above named procedures.  Pt awoke from anesthesia without complication and was transferred to the floor. On POD1, patient's pain was well controlled but he complained of dizziness when up and moving with physical therapy.  On postop day 2, his pain continued to improve and he was able to mobilize well with physical therapy without any dizziness.  He was discharged home on postop day 2.  Pt will f/u with Dr. Marlou Sa in clinic in ~2 weeks.   Antibiotics given:  Anti-infectives (From admission, onward)   Start     Dose/Rate Route Frequency Ordered Stop   03/14/19 1900  ceFAZolin (ANCEF) IVPB 2g/100 mL premix     2 g 200 mL/hr over 30 Minutes Intravenous Every 6 hours 03/14/19 1726 03/15/19 0054   03/14/19 1030  ceFAZolin (ANCEF) IVPB 2g/100 mL premix     2 g 200 mL/hr over 30 Minutes Intravenous On call to O.R. 03/14/19 1020 03/14/19 1314   03/14/19 1025  ceFAZolin (ANCEF) 2-4 GM/100ML-% IVPB    Note to Pharmacy: Nyoka Cowden   : cabinet override      03/14/19 1025 03/14/19 1259    .  Recent vital signs:  Vitals:   03/16/19 0424 03/16/19 0923  BP: (!) 156/82 138/80  Pulse: 68 72  Resp: 17 18  Temp: 97.7 F (36.5 C) 98.8 F (37.1 C)  SpO2: 96% 95%    Recent laboratory studies:  Results for orders placed or performed during the hospital  encounter of 03/14/19  Glucose, capillary  Result Value Ref Range   Glucose-Capillary 81 70 - 99 mg/dL  Glucose, capillary  Result Value Ref Range   Glucose-Capillary 153 (H) 70 - 99 mg/dL    Discharge Medications:   Allergies as of 03/16/2019      Reactions   Bee Venom Swelling      Medication List    STOP taking these medications   acetaminophen 650 MG CR tablet Commonly known as: TYLENOL   traMADol 50 MG tablet Commonly known as: ULTRAM     TAKE these medications   aspirin 81 MG chewable tablet Chew 1 tablet (81 mg total) by mouth daily.   celecoxib 100 MG capsule Commonly known as: CELEBREX Take 100 mg by mouth daily.   diltiazem 30 MG tablet Commonly known as: CARDIZEM Take 1 tablet (30 mg total) by mouth 4 (four) times daily as needed. What changed: reasons to take this   FISH OIL PO Take 1 capsule by mouth daily.   methocarbamol 500 MG tablet Commonly known as: ROBAXIN Take 1 tablet (500 mg total) by mouth every 8 (eight) hours as needed for muscle spasms.   metoprolol succinate 50 MG 24 hr tablet Commonly known as: TOPROL-XL Take 1 tablet (50 mg total) by mouth daily. What changed: when to take this   omeprazole 20 MG capsule Commonly known as: PRILOSEC  Take 1 capsule (20 mg total) by mouth daily.   oxyCODONE 5 MG immediate release tablet Commonly known as: Oxy IR/ROXICODONE Take 1 tablet (5 mg total) by mouth every 4 (four) hours as needed for moderate pain (pain score 4-6).   rosuvastatin 20 MG tablet Commonly known as: CRESTOR Take 10 mg by mouth at bedtime.   sildenafil 20 MG tablet Commonly known as: REVATIO Take 20-60 mg by mouth daily as needed (erectile dysfunction.).   telmisartan-hydrochlorothiazide 40-12.5 MG tablet Commonly known as: MICARDIS HCT Take 1 tablet by mouth daily.   valACYclovir 1000 MG tablet Commonly known as: VALTREX Take 1-2 g by mouth daily as needed (cold sores/fever blisters).       Diagnostic Studies:  No results found.  Disposition:   Discharge Instructions    Call MD / Call 911   Complete by: As directed    If you experience chest pain or shortness of breath, CALL 911 and be transported to the hospital emergency room.  If you develope a fever above 101 F, pus (white drainage) or increased drainage or redness at the wound, or calf pain, call your surgeon's office.   Constipation Prevention   Complete by: As directed    Drink plenty of fluids.  Prune juice may be helpful.  You may use a stool softener, such as Colace (over the counter) 100 mg twice a day.  Use MiraLax (over the counter) for constipation as needed.   Diet - low sodium heart healthy   Complete by: As directed    Discharge instructions   Complete by: As directed    You may shower, dressing is waterproof.  Do not remove the dressing, we will remove it at your first post-op appointment.  Do not take a bath or soak the knee in a tub or pool.  You may weightbear as you can tolerate on the operative leg with a walker.  Continue using the CPM machine 3 times per day for at least one hour each time, increasing the degrees of range of motion daily.  Use the blue cradle boot or a pillow under your heel to work on getting your leg straight.  Do NOT put a pillow under your knee.  You will follow-up with Dr. August Saucer in the clinic in 1-2 weeks at your given appointment date.   Increase activity slowly as tolerated   Complete by: As directed       Follow-up Information    [facility] Follow up.   Specialty: Home Health Services Why: Home Health Physical Therapy; you will receive a call to discuss scheduling  Contact information: [address] [place] [address] [phone]            Signed: Julieanne Cotton 03/19/2019, 2:42 PM

## 2019-03-20 ENCOUNTER — Telehealth: Payer: Self-pay | Admitting: Orthopedic Surgery

## 2019-03-20 ENCOUNTER — Other Ambulatory Visit: Payer: Self-pay | Admitting: *Deleted

## 2019-03-20 NOTE — Telephone Encounter (Signed)
Need to use the bone foam until the leg is completely straight.  Once the leg is straight at rest with no bend or correct to it you can stop using the bone foam.  Otherwise I would use it at least 15 to 20 minutes 3 times a day Yes to #2  Try magnesium citrate.  This is related to pain medicine.  Is not related to swelling.  Magnesium citrate is over-the-counter.

## 2019-03-20 NOTE — Telephone Encounter (Signed)
Flor from [facility] called requesting VO for [facility] PT for the following:  3x a week for 1 week 2x a week for 1 week   CB#[phone].  Thank you.

## 2019-03-20 NOTE — Telephone Encounter (Signed)
Received e mail from patient stating that the [place] did not receive records that I faxed 10/5. He provided and alternate fax. I re faxed records to [phone] which included his [place] Number [phone]

## 2019-03-20 NOTE — Telephone Encounter (Signed)
IC LMVM advising ok for orders.  

## 2019-03-23 ENCOUNTER — Telehealth: Payer: Self-pay | Admitting: Orthopedic Surgery

## 2019-03-23 ENCOUNTER — Encounter: Payer: Self-pay | Admitting: Orthopedic Surgery

## 2019-03-23 DIAGNOSIS — Z96651 Presence of right artificial knee joint: Secondary | ICD-10-CM

## 2019-03-23 NOTE — Telephone Encounter (Signed)
Patient had surgery 03-14-19 and wants to go to the [facility] for outpatient PT in [place].  His discharge date is Tuesday.  Wednesday he will have a follow up appointment with Dr. Marlou Sa   cb  [phone]

## 2019-03-24 ENCOUNTER — Other Ambulatory Visit: Payer: Self-pay | Admitting: Surgical

## 2019-03-24 ENCOUNTER — Ambulatory Visit: Payer: Medicare Other

## 2019-03-24 ENCOUNTER — Encounter: Payer: Self-pay | Admitting: Orthopedic Surgery

## 2019-03-24 MED ORDER — OXYCODONE HCL 5 MG PO TABS: 5.0000 mg | ORAL_TABLET | Freq: Four times a day (QID) | ORAL | 0 refills | Status: DC | PRN

## 2019-03-27 ENCOUNTER — Encounter: Payer: Medicare Other | Admitting: Internal Medicine

## 2019-03-29 ENCOUNTER — Ambulatory Visit: Payer: Medicare Other

## 2019-03-29 ENCOUNTER — Inpatient Hospital Stay: Payer: Medicare Other | Admitting: Orthopedic Surgery

## 2019-03-29 ENCOUNTER — Ambulatory Visit: Payer: Medicare Other | Admitting: Internal Medicine

## 2019-03-29 NOTE — Telephone Encounter (Signed)
Can address at his f/u appt

## 2019-03-31 ENCOUNTER — Telehealth: Payer: Self-pay

## 2019-03-31 ENCOUNTER — Other Ambulatory Visit: Payer: Self-pay

## 2019-03-31 ENCOUNTER — Ambulatory Visit (INDEPENDENT_AMBULATORY_CARE_PROVIDER_SITE_OTHER): Payer: Medicare Other | Admitting: Orthopedic Surgery

## 2019-03-31 ENCOUNTER — Encounter: Payer: Self-pay | Admitting: Orthopedic Surgery

## 2019-03-31 ENCOUNTER — Ambulatory Visit (INDEPENDENT_AMBULATORY_CARE_PROVIDER_SITE_OTHER): Payer: Medicare Other

## 2019-03-31 DIAGNOSIS — Z96651 Presence of right artificial knee joint: Secondary | ICD-10-CM

## 2019-03-31 MED ORDER — HYDROCODONE-ACETAMINOPHEN 5-325 MG PO TABS: 1.0000 | ORAL_TABLET | Freq: Four times a day (QID) | ORAL | 0 refills | Status: DC | PRN

## 2019-03-31 NOTE — Telephone Encounter (Signed)
I scheduled patient for Monday at [facility] 10am for doppler r/o DVT. However when I called patient to notify of appt he said he could not make this time. He will call [facility] back to get this rescheduled.

## 2019-04-01 ENCOUNTER — Encounter: Payer: Self-pay | Admitting: Orthopedic Surgery

## 2019-04-01 NOTE — Progress Notes (Signed)
Post-Op Visit Note   Patient: Mark Farley.           Date of Birth: June 28, 1945           MRN: [id number] Visit Date: 03/31/2019 PCP: Sherlene Shams, MD   Assessment & Plan:  Chief Complaint: No chief complaint on file.  Visit Diagnoses:  1. S/P total knee arthroplasty, right     Plan: Mark Farley is a 73 year old male who presents to the office s/p right total knee arthroplasty on 03/14/2019.  Patient states that he is doing well overall.  He has been ambulating with a walker and is up to 115 degrees on the CPM machine.  He has had to discontinue taking oxycodone due to the anxiety and insomnia that the medicine brought on.  He has only been taking the occasional Tylenol for pain over the past few days.  He has been compliant with taking aspirin as directed.  On exam patient has 0-5 degrees of extension and about 80 degrees of flexion.  He has been doing home health physical therapy and is ready to progress to outpatient physical therapy.  His incision is healing well with no evidence of gapping.  Patient does have some calf tenderness on exam but a negative Homans' sign.  Ordered an ultrasound of the right lower extremity to rule out deep vein thrombosis.  X-rays of the right knee taken today in clinic were reviewed with the patient and reveal a well fixed and aligned prosthesis with no complicating features.  We will transition patient from oxycodone to hydrocodone to see if this improves the side effects that he was experiencing with the prior medication.  Patient will begin outpatient physical therapy, a written prescription for PT was given to the patient.  He will follow-up with the office in 3 weeks for clinical recheck.  Follow-Up Instructions: No follow-ups on file.   Orders:  Orders Placed This Encounter  Procedures  . XR Knee 1-2 Views Right  . VAS [place] LOWER EXTREMITY VENOUS (DVT)   Meds ordered this encounter  Medications  . HYDROcodone-acetaminophen (NORCO/VICODIN) 5-325  MG tablet    Sig: Take 1 tablet by mouth every 6 (six) hours as needed for moderate pain.    Dispense:  35 tablet    Refill:  0    Imaging: No results found.  PMFS History: Patient Active Problem List   Diagnosis Date Noted  . Arthritis of right knee 03/14/2019  . Educated about COVID-19 virus infection 09/25/2018  . Erectile dysfunction 09/25/2018  . Intention tremor 03/09/2017  . Tingling of left upper extremity 03/09/2017  . Runner's knee, right 03/09/2017  . Hepatic steatosis 03/09/2017  . Hearing loss due to cerumen impaction 03/08/2017  . Encounter for preventive health examination 05/26/2015  . SVT (supraventricular tachycardia) (HCC) 02/07/2015  . History of syncope 03/06/2014  . PVC's (premature ventricular contractions) 03/06/2014  . Osteoarthritis 02/08/2012  . Prediabetes 08/06/2011  . Overweight (BMI 25.0-29.9) 02/06/2011  . Family history of colon cancer requiring screening colonoscopy 02/05/2011  . Mixed hyperlipidemia 08/14/2008  . HYPERTENSION, BENIGN 08/14/2008   Past Medical History:  Diagnosis Date  . Arthritis    ? Gout  . Bilateral carpal tunnel syndrome    managed with nocturnal braces x 3 years  . COPD (chronic obstructive pulmonary disease) (HCC)   . Dysrhythmia    Tachycardia, PVCs, SVTs  . GERD (gastroesophageal reflux disease)   . Glucose intolerance (impaired glucose tolerance)   . Heart murmur  as a child  . History of syncope 2005   Unexplained, myoview negative  . Hyperlipidemia   . Hypertension   . Iron deficiency anemia   . Osteoarthritis of multiple joints   . Palpitations 2010   Life Watch with single PVC  . Pre-diabetes     Family History  Problem Relation Age of Onset  . Coronary artery disease Mother   . Heart attack Mother        MI  . Heart failure Father        CHF  . Colon cancer Brother   . Colon cancer Paternal Grandmother     Past Surgical History:  Procedure Laterality Date  . APPENDECTOMY     1957  .  COLONOSCOPY    . TONSILLECTOMY     1952  . TOTAL KNEE ARTHROPLASTY Right 03/14/2019   Procedure: RIGHT TOTAL KNEE ARTHROPLASTY;  Surgeon: Meredith Pel, MD;  Location: [facility];  Service: Orthopedics;  Laterality: Right;   Social History   Occupational History  . Not on file  Tobacco Use  . Smoking status: Never Smoker  . Smokeless tobacco: Never Used  Substance and Sexual Activity  . Alcohol use: Yes    Alcohol/week: 14.0 standard drinks    Types: 14 Standard drinks or equivalent per week    Comment: 2 glasses of wine a day  . Drug use: No  . Sexual activity: Yes

## 2019-04-03 ENCOUNTER — Ambulatory Visit (HOSPITAL_COMMUNITY)
Admission: RE | Admit: 2019-04-03 | Discharge: 2019-04-03 | Disposition: A | Discharge status: Home / Self Care | Payer: Medicare Other | Source: Ambulatory Visit | Attending: Orthopedic Surgery | Admitting: Orthopedic Surgery

## 2019-04-03 ENCOUNTER — Other Ambulatory Visit: Payer: Self-pay

## 2019-04-03 ENCOUNTER — Telehealth: Payer: Self-pay

## 2019-04-03 ENCOUNTER — Ambulatory Visit (HOSPITAL_COMMUNITY): Payer: Medicare Other

## 2019-04-03 DIAGNOSIS — Z96651 Presence of right artificial knee joint: Secondary | ICD-10-CM | POA: Insufficient documentation

## 2019-04-03 NOTE — Telephone Encounter (Signed)
Vascular called and patient was negative for DVT- RLE.  CB [phone]

## 2019-04-03 NOTE — Telephone Encounter (Signed)
FYI

## 2019-04-03 NOTE — Progress Notes (Signed)
Right lower extremity venous duplex has been completed. Preliminary results can be found in CV Proc through chart review.  Results were given to Kathlee Nations at Dr. Randel Pigg office.  04/03/19 2:32 PM Carlos Levering RVT

## 2019-04-07 ENCOUNTER — Other Ambulatory Visit: Payer: Self-pay | Admitting: Surgical

## 2019-04-07 ENCOUNTER — Encounter: Payer: Self-pay | Admitting: Orthopedic Surgery

## 2019-04-07 MED ORDER — METHOCARBAMOL 500 MG PO TABS: 500.0000 mg | ORAL_TABLET | Freq: Three times a day (TID) | ORAL | 0 refills | Status: DC | PRN

## 2019-04-10 ENCOUNTER — Ambulatory Visit (INDEPENDENT_AMBULATORY_CARE_PROVIDER_SITE_OTHER): Payer: Medicare Other | Admitting: Internal Medicine

## 2019-04-10 ENCOUNTER — Other Ambulatory Visit: Payer: Self-pay

## 2019-04-10 DIAGNOSIS — E782 Mixed hyperlipidemia: Secondary | ICD-10-CM | POA: Diagnosis not present

## 2019-04-10 DIAGNOSIS — Z125 Encounter for screening for malignant neoplasm of prostate: Secondary | ICD-10-CM | POA: Diagnosis not present

## 2019-04-10 DIAGNOSIS — R7303 Prediabetes: Secondary | ICD-10-CM

## 2019-04-10 DIAGNOSIS — I10 Essential (primary) hypertension: Secondary | ICD-10-CM | POA: Diagnosis not present

## 2019-04-10 LAB — COMPREHENSIVE METABOLIC PANEL
ALT: 13 U/L (ref 0–53)
AST: 15 U/L (ref 0–37)
Albumin: 4.3 g/dL (ref 3.5–5.2)
Alkaline Phosphatase: 75 U/L (ref 39–117)
BUN: 16 mg/dL (ref 6–23)
CO2: 30 mEq/L (ref 19–32)
Calcium: 9.5 mg/dL (ref 8.4–10.5)
Chloride: 98 mEq/L (ref 96–112)
Creatinine, Ser: 0.85 mg/dL (ref 0.40–1.50)
GFR: 88.21 mL/min (ref >60.00)
Glucose, Bld: 100 mg/dL — ABNORMAL HIGH (ref 70–99)
Potassium: 4.7 mEq/L (ref 3.5–5.1)
Sodium: 136 mEq/L (ref 135–145)
Total Bilirubin: 0.9 mg/dL (ref 0.2–1.2)
Total Protein: 6.7 g/dL (ref 6.0–8.3)

## 2019-04-10 LAB — PSA, MEDICARE: PSA: 0.79 ng/ml (ref 0.10–4.00)

## 2019-04-10 LAB — LIPID PANEL
Cholesterol: 138 mg/dL (ref 0–200)
HDL: 49.5 mg/dL (ref >39.00)
LDL Cholesterol: 64 mg/dL (ref 0–99)
NonHDL: 88.3
Total CHOL/HDL Ratio: 3
Triglycerides: 120 mg/dL (ref 0.0–149.0)
VLDL: 24 mg/dL (ref 0.0–40.0)

## 2019-04-12 ENCOUNTER — Other Ambulatory Visit: Payer: Self-pay

## 2019-04-12 ENCOUNTER — Ambulatory Visit (INDEPENDENT_AMBULATORY_CARE_PROVIDER_SITE_OTHER): Payer: Medicare Other | Admitting: Internal Medicine

## 2019-04-12 VITALS — BP 178/98 | HR 67 | Temp 97.0°F | Resp 16 | Ht 67.0 in | Wt 181.2 lb

## 2019-04-12 DIAGNOSIS — I10 Essential (primary) hypertension: Secondary | ICD-10-CM | POA: Diagnosis not present

## 2019-04-12 DIAGNOSIS — Z96651 Presence of right artificial knee joint: Secondary | ICD-10-CM | POA: Diagnosis not present

## 2019-04-12 DIAGNOSIS — R7303 Prediabetes: Secondary | ICD-10-CM

## 2019-04-12 DIAGNOSIS — Z0000 Encounter for general adult medical examination without abnormal findings: Secondary | ICD-10-CM | POA: Diagnosis not present

## 2019-04-12 DIAGNOSIS — K760 Fatty (change of) liver, not elsewhere classified: Secondary | ICD-10-CM | POA: Diagnosis not present

## 2019-04-12 DIAGNOSIS — E782 Mixed hyperlipidemia: Secondary | ICD-10-CM

## 2019-04-12 LAB — MICROALBUMIN / CREATININE URINE RATIO
Creatinine,U: 27.6 mg/dL
Microalb Creat Ratio: 2.5 mg/g (ref 0.0–30.0)
Microalb, Ur: <0.7 mg/dL (ref 0.0–1.9)

## 2019-04-12 MED ORDER — TRAZODONE HCL 50 MG PO TABS: 25.0000 mg | ORAL_TABLET | Freq: Every evening | ORAL | 3 refills | Status: DC | PRN

## 2019-04-12 NOTE — Patient Instructions (Addendum)
Your cholesterol is at goal   Continue crestor  Adding trazodone for your sleep issues  1/2 tablet one hour before bedtime.  Repeat in one hour   Check with Dr Marlou Sa about your celebrex  Use   The new goals for optimal blood pressure management are 130/80 or less .  Please check your blood pressure a few times at home and send me the readings so I can determine if you need a dose change in your telmisartan    Health Maintenance After Age 73 After age 57, you are at a higher risk for certain long-term diseases and infections as well as injuries from falls. Falls are a major cause of broken bones and head injuries in people who are older than age 50. Getting regular preventive care can help to keep you healthy and well. Preventive care includes getting regular testing and making lifestyle changes as recommended by your health care provider. Talk with your health care provider about:  Which screenings and tests you should have. A screening is a test that checks for a disease when you have no symptoms.  A diet and exercise plan that is right for you. What should I know about screenings and tests to prevent falls? Screening and testing are the best ways to find a health problem early. Early diagnosis and treatment give you the best chance of managing medical conditions that are common after age 72. Certain conditions and lifestyle choices may make you more likely to have a fall. Your health care provider may recommend:  Regular vision checks. Poor vision and conditions such as cataracts can make you more likely to have a fall. If you wear glasses, make sure to get your prescription updated if your vision changes.  Medicine review. Work with your health care provider to regularly review all of the medicines you are taking, including over-the-counter medicines. Ask your health care provider about any side effects that may make you more likely to have a fall. Tell your health care provider if any  medicines that you take make you feel dizzy or sleepy.  Osteoporosis screening. Osteoporosis is a condition that causes the bones to get weaker. This can make the bones weak and cause them to break more easily.  Blood pressure screening. Blood pressure changes and medicines to control blood pressure can make you feel dizzy.  Strength and balance checks. Your health care provider may recommend certain tests to check your strength and balance while standing, walking, or changing positions.  Foot health exam. Foot pain and numbness, as well as not wearing proper footwear, can make you more likely to have a fall.  Depression screening. You may be more likely to have a fall if you have a fear of falling, feel emotionally low, or feel unable to do activities that you used to do.  Alcohol use screening. Using too much alcohol can affect your balance and may make you more likely to have a fall. What actions can I take to lower my risk of falls? General instructions  Talk with your health care provider about your risks for falling. Tell your health care provider if: ? You fall. Be sure to tell your health care provider about all falls, even ones that seem minor. ? You feel dizzy, sleepy, or off-balance.  Take over-the-counter and prescription medicines only as told by your health care provider. These include any supplements.  Eat a healthy diet and maintain a healthy weight. A healthy diet includes low-fat dairy products, low-fat (  lean) meats, and fiber from whole grains, beans, and lots of fruits and vegetables. Home safety  Remove any tripping hazards, such as rugs, cords, and clutter.  Install safety equipment such as grab bars in bathrooms and safety rails on stairs.  Keep rooms and walkways well-lit. Activity   Follow a regular exercise program to stay fit. This will help you maintain your balance. Ask your health care provider what types of exercise are appropriate for you.  If you  need a cane or walker, use it as recommended by your health care provider.  Wear supportive shoes that have nonskid soles. Lifestyle  Do not drink alcohol if your health care provider tells you not to drink.  If you drink alcohol, limit how much you have: ? 0-1 drink a day for women. ? 0-2 drinks a day for men.  Be aware of how much alcohol is in your drink. In the [place] one drink equals one typical bottle of beer (12 oz), one-half glass of wine (5 oz), or one shot of hard liquor (1 oz).  Do not use any products that contain nicotine or tobacco, such as cigarettes and e-cigarettes. If you need help quitting, ask your health care provider. Summary  Having a healthy lifestyle and getting preventive care can help to protect your health and wellness after age 30.  Screening and testing are the best way to find a health problem early and help you avoid having a fall. Early diagnosis and treatment give you the best chance for managing medical conditions that are more common for people who are older than age 73.  Falls are a major cause of broken bones and head injuries in people who are older than age 62. Take precautions to prevent a fall at home.  Work with your health care provider to learn what changes you can make to improve your health and wellness and to prevent falls. This information is not intended to replace advice given to you by your health care provider. Make sure you discuss any questions you have with your health care provider. Document Released: 03/31/2017 Document Revised: 09/08/2018 Document Reviewed: 03/31/2017 Elsevier Patient Education  2020 [employer].

## 2019-04-12 NOTE — Progress Notes (Signed)
Patient ID: Mark Pillownthony J Branscome Jr., male    DOB: 10/01/1945  Age: 73 y.o. MRN: [id number]  The patient is here for annual preventive  examination and management of other chronic and acute problems.   The risk factors are reflected in the social history.  The roster of all physicians providing medical care to patient - is listed in the Snapshot section of the chart.  Activities of daily living:  The patient is 100% independent in all ADLs: dressing, toileting, feeding as well as independent mobility  Home safety : The patient has smoke detectors in the home. They wear seatbelts.  There are no firearms at home. There is no violence in the home.   There is no risks for hepatitis, STDs or HIV. There is no   history of blood transfusion. They have no travel history to infectious disease endemic areas of the world.  The patient has seen their dentist in the last six month. They have seen their eye doctor in the last year. They deny any  hearing difficulty with regard to whispered voices and some television programs.  They have deferred audiologic testing in the last year.  They do not  have excessive sun exposure. Discussed the need for sun protection: hats, long sleeves and use of sunscreen if there is significant sun exposure.   Diet: the importance of a healthy diet is discussed. They do have a healthy diet.  The benefits of regular aerobic exercise were discussed. he walks 4 times per week ,  20 minutes.   Depression screen: there are no signs or vegative symptoms of depression- irritability, change in appetite, anhedonia, sadness/tearfullness.  Cognitive assessment: the patient manages all their financial and personal affairs and is actively engaged. They could relate day,date,year and events; recalled 2/3 objects at 3 minutes; performed clock-face test normally.  The following portions of the patient's history were reviewed and updated as appropriate: allergies, current medications, past family  history, past medical history,  past surgical history, past social history  and problem list.  Visual acuity was not assessed per patient preference since she has regular follow up with her ophthalmologist. Hearing and body mass index were assessed and reviewed.   During the course of the visit the patient was educated and counseled about appropriate screening and preventive services including : fall prevention , diabetes screening, nutrition counseling, colorectal cancer screening, and recommended immunizations.    CC: The primary encounter diagnosis was Encounter for preventive health examination. Diagnoses of Elevated blood pressure reading with diagnosis of hypertension, Prediabetes, S/P total knee arthroplasty, right, Hepatic steatosis, Mixed hyperlipidemia, and HYPERTENSION, BENIGN were also pertinent to this visit.  1) elevated blood pressure.  Patient is taking his medications as prescribed and notes no adverse effects.  Home BP readings have NOT been done .  he is avoiding added salt in hIS diet and RECOVERING FROM A TOTAL KNEE REPLACEMENT   2) s/p knee surgery  Right TKR. Post op course  Complicated by increase pain and swelling;  U/S was negative for DVT on nov 3 but poor quality .  only used baby aspirin .  Now getting outpatient PT    Taking hydrocodone and MR before PT  Having the left done in February   3) New onset insomnia : Hasn't slept well in 4 weeks since his  surgery.  2 hour latency ,  Frequent wakening .  History of intolerance to oxycodone due to agitation   3)  Pre prediabetes .  Reviewed  diet and exercise.   4)  Hyperlipidemia:back on Crestor  .  Has lost 15 lbs since last visit by reducing carbs and portion sizes.  drinking protein shakes  goal is 165   History Kellar has a past medical history of Arthritis, Bilateral carpal tunnel syndrome, COPD (chronic obstructive pulmonary disease) (HCC), Dysrhythmia, GERD (gastroesophageal reflux disease), Glucose intolerance  (impaired glucose tolerance), Heart murmur, History of syncope (2005), Hyperlipidemia, Hypertension, Iron deficiency anemia, Osteoarthritis of multiple joints, Palpitations (2010), and Pre-diabetes.   He has a past surgical history that includes Colonoscopy; Tonsillectomy; Appendectomy; and Total knee arthroplasty (Right, 03/14/2019).   His family history includes Colon cancer in his brother and paternal grandmother; Coronary artery disease in his mother; Heart attack in his mother; Heart failure in his father.He reports that he has never smoked. He has never used smokeless tobacco. He reports current alcohol use of about 14.0 standard drinks of alcohol per week. He reports that he does not use drugs.  Outpatient Medications Prior to Visit  Medication Sig Dispense Refill  . traMADol (ULTRAM) 50 MG tablet Take by mouth every 6 (six) hours as needed.    Marland Kitchen aspirin 81 MG chewable tablet Chew 1 tablet (81 mg total) by mouth daily. 30 tablet 0  . celecoxib (CELEBREX) 100 MG capsule Take 100 mg by mouth daily.    Marland Kitchen diltiazem (CARDIZEM) 30 MG tablet Take 1 tablet (30 mg total) by mouth 4 (four) times daily as needed. (Patient taking differently: Take 30 mg by mouth 4 (four) times daily as needed (palpitations.). ) 90 tablet 3  . HYDROcodone-acetaminophen (NORCO/VICODIN) 5-325 MG tablet Take 1 tablet by mouth every 6 (six) hours as needed for moderate pain. 35 tablet 0  . methocarbamol (ROBAXIN) 500 MG tablet Take 1 tablet (500 mg total) by mouth every 8 (eight) hours as needed for muscle spasms. 30 tablet 0  . metoprolol succinate (TOPROL-XL) 50 MG 24 hr tablet Take 1 tablet (50 mg total) by mouth daily. (Patient taking differently: Take 50 mg by mouth every evening. ) 90 tablet 3  . Omega-3 Fatty Acids (FISH OIL PO) Take 1 capsule by mouth daily.    Marland Kitchen omeprazole (PRILOSEC) 20 MG capsule Take 1 capsule (20 mg total) by mouth daily. 90 capsule 3  . rosuvastatin (CRESTOR) 20 MG tablet Take 10 mg by mouth at  bedtime.    . sildenafil (REVATIO) 20 MG tablet Take 20-60 mg by mouth daily as needed (erectile dysfunction.).     Marland Kitchen telmisartan-hydrochlorothiazide (MICARDIS HCT) 40-12.5 MG tablet Take 1 tablet by mouth daily. 90 tablet 3  . valACYclovir (VALTREX) 1000 MG tablet Take 1-2 g by mouth daily as needed (cold sores/fever blisters).     Marland Kitchen oxyCODONE (OXY IR/ROXICODONE) 5 MG immediate release tablet Take 1 tablet (5 mg total) by mouth every 6 (six) hours as needed for moderate pain (pain score 4-6). 35 tablet 0   No facility-administered medications prior to visit.     Review of Systems   Patient denies headache, fevers, malaise, unintentional weight loss, skin rash, eye pain, sinus congestion and sinus pain, sore throat, dysphagia,  hemoptysis , cough, dyspnea, wheezing, chest pain, palpitations, orthopnea, edema, abdominal pain, nausea, melena, diarrhea, constipation, flank pain, dysuria, hematuria, urinary  Frequency, nocturia, numbness, tingling, seizures,  Focal weakness, Loss of consciousness,  Tremor, insomnia, depression, anxiety, and suicidal ideation.      Objective:  BP (!) 178/98   Pulse 67   Temp (!) 97 F (36.1 C)  Resp 16   Ht 5\' 7"  (1.702 m)   Wt 181 lb 3.2 oz (82.2 kg)   SpO2 98%   BMI 28.38 kg/m   Physical Exam   General appearance: alert, cooperative and appears stated age Ears: normal TM's and external ear canals both ears Throat: lips, mucosa, and tongue normal; teeth and gums normal Neck: no adenopathy, no carotid bruit, supple, symmetrical, trachea midline and thyroid not enlarged, symmetric, no tenderness/mass/nodules Back: symmetric, no curvature. ROM normal. No CVA tenderness. Lungs: clear to auscultation bilaterally Heart: regular rate and rhythm, S1, S2 normal, no murmur, click, rub or gallop Abdomen: soft, non-tender; bowel sounds normal; no masses,  no organomegaly Pulses: 2+ and symmetric Skin: Skin color, texture, turgor normal. No rashes or  lesions Lymph nodes: Cervical, supraclavicular, and axillary nodes normal. Ext: right knee with diffuse swelling,  Well healing midline surgical incision     Assessment & Plan:   Problem List Items Addressed This Visit      Unprioritized   Mixed hyperlipidemia    His risk of CAD is  33% using the FRC .  He has resumed Crestor  Lab Results  Component Value Date   CHOL 138 04/10/2019   HDL 49.50 04/10/2019   LDLCALC 64 04/10/2019   LDLDIRECT 81.0 03/08/2017   TRIG 120.0 04/10/2019   CHOLHDL 3 04/10/2019         HYPERTENSION, BENIGN    he reports compliance with medication regimen  but has an elevated reading today in office.  She is not using NSAIDs daily.  Discussed goal of   (130/80 for patients over 70)  to preserve renal function.  he has been asked to check her  BP  at home and  submit readings for evaluation. Renal function, electrolytes and screen for proteinuria are all normal       Prediabetes    Managed with low GI diet, weight loss and exercise.  a1c has  improved.  He is not taking a statin     Lab Results  Component Value Date   HGBA1C 5.7 (H) 03/10/2019         Encounter for preventive health examination - Primary    age appropriate education and counseling updated, referrals for preventative services and immunizations addressed, dietary and smoking counseling addressed, most recent labs reviewed.  I have personally reviewed and have noted:  1) the patient's medical and social history 2) The pt's use of alcohol, tobacco, and illicit drugs 3) The patient's current medications and supplements 4) Functional ability including ADL's, fall risk, home safety risk, hearing and visual impairment 5) Diet and physical activities 6) Evidence for depression or mood disorder 7) The patient's height, weight, and BMI have been recorded in the chart  I have made referrals, and provided counseling and education based on review of the above      Hepatic steatosis     Previously seen ALT elevation is now resolved.  Serologies for iron overload and other causes were negative.  Ultrasound suggestive of hepatic steatosis.  He is managing his prediabetes and has lost 10% of his body weight since last visit and has resumed statin therapy  Lab Results  Component Value Date   ALT 13 04/10/2019   AST 15 04/10/2019   ALKPHOS 75 04/10/2019   BILITOT 0.9 04/10/2019         S/P total knee arthroplasty, right    Current exam notes diffuse swelling without bruising, erythema  or excessive warmth.  Recommend  liberal use of ice.  Has been taking celebrex since surgery .  Advised to discuss with Dr August Saucer as this is usually suspended  Perioperatively        Other Visit Diagnoses    Elevated blood pressure reading with diagnosis of hypertension       Relevant Orders   Microalbumin / creatinine urine ratio (Completed)      I have discontinued Mark Pillow. "Tony"'s oxyCODONE. I am also having him start on traZODone. Additionally, I am having him maintain his omeprazole, diltiazem, metoprolol succinate, telmisartan-hydrochlorothiazide, rosuvastatin, celecoxib, valACYclovir, sildenafil, Omega-3 Fatty Acids (FISH OIL PO), aspirin, HYDROcodone-acetaminophen, methocarbamol, and traMADol.  Meds ordered this encounter  Medications  . traZODone (DESYREL) 50 MG tablet    Sig: Take 0.5-1 tablets (25-50 mg total) by mouth at bedtime as needed for sleep.    Dispense:  30 tablet    Refill:  3    Medications Discontinued During This Encounter  Medication Reason  . oxyCODONE (OXY IR/ROXICODONE) 5 MG immediate release tablet Error    Follow-up: No follow-ups on file.   Sherlene Shams, MD

## 2019-04-13 NOTE — Assessment & Plan Note (Signed)
Managed with low GI diet, weight loss and exercise.  a1c has  improved.  He is not taking a statin     Lab Results  Component Value Date   HGBA1C 5.7 (H) 03/10/2019

## 2019-04-13 NOTE — Assessment & Plan Note (Signed)
he reports compliance with medication regimen  but has an elevated reading today in office.  She is not using NSAIDs daily.  Discussed goal of   (130/80 for patients over 70)  to preserve renal function.  he has been asked to check her  BP  at home and  submit readings for evaluation. Renal function, electrolytes and screen for proteinuria are all normal

## 2019-04-13 NOTE — Assessment & Plan Note (Signed)
Current exam notes diffuse swelling without bruising, erythema  or excessive warmth.  Recommend liberal use of ice.  Has been taking celebrex since surgery .  Advised to discuss with Dr Marlou Sa as this is usually suspended  Perioperatively

## 2019-04-13 NOTE — Assessment & Plan Note (Signed)
His risk of CAD is  33% using the FRC .  He has resumed Crestor  Lab Results  Component Value Date   CHOL 138 04/10/2019   HDL 49.50 04/10/2019   LDLCALC 64 04/10/2019   LDLDIRECT 81.0 03/08/2017   TRIG 120.0 04/10/2019   CHOLHDL 3 04/10/2019

## 2019-04-13 NOTE — Assessment & Plan Note (Signed)
Previously seen ALT elevation is now resolved.  Serologies for iron overload and other causes were negative.  Ultrasound suggestive of hepatic steatosis.  He is managing his prediabetes and has lost 10% of his body weight since last visit and has resumed statin therapy  Lab Results  Component Value Date   ALT 13 04/10/2019   AST 15 04/10/2019   ALKPHOS 75 04/10/2019   BILITOT 0.9 04/10/2019

## 2019-04-13 NOTE — Assessment & Plan Note (Signed)

## 2019-04-18 MED ORDER — TELMISARTAN-HCTZ 80-25 MG PO TABS: 1.0000 | ORAL_TABLET | Freq: Every day | ORAL | 1 refills | Status: DC

## 2019-04-20 ENCOUNTER — Encounter: Payer: Self-pay | Admitting: Orthopedic Surgery

## 2019-04-20 NOTE — Telephone Encounter (Signed)
Sure because he asked so nicely.  She will have to go to the same screening.

## 2019-04-21 ENCOUNTER — Other Ambulatory Visit: Payer: Self-pay

## 2019-04-21 ENCOUNTER — Encounter: Payer: Self-pay | Admitting: Orthopedic Surgery

## 2019-04-21 ENCOUNTER — Ambulatory Visit (INDEPENDENT_AMBULATORY_CARE_PROVIDER_SITE_OTHER): Payer: Medicare Other | Admitting: Orthopedic Surgery

## 2019-04-21 DIAGNOSIS — Z96651 Presence of right artificial knee joint: Secondary | ICD-10-CM

## 2019-04-21 NOTE — Progress Notes (Signed)
Post-Op Visit Note   Patient: Mark Farley.           Date of Birth: [birth year]           MRN: [id number] Visit Date: 04/21/2019 PCP: Crecencio Mc, MD   Assessment & Plan:  Chief Complaint:  Chief Complaint  Patient presents with  . Right Knee - Follow-up   Visit Diagnoses: No diagnosis found.  Plan: [name] is now about 5 weeks out right total knee replacement.  On exam the incisions intact.  Has about 90 degrees of flexion.  I like him to continue doing therapy to work on patella mobilization flexion and then extension.  Does have pretty good heel-to-toe gait.  Continue therapy 3 times a week for 6 weeks.  Follow-up with me in 6 weeks for repeat clinical check.  He is thinking about getting the left one done in February.  Follow-Up Instructions: Return in about 6 weeks (around 06/02/2019).   Orders:  No orders of the defined types were placed in this encounter.  No orders of the defined types were placed in this encounter.   Imaging: No results found.  PMFS History: Patient Active Problem List   Diagnosis Date Noted  . S/P total knee arthroplasty, right 03/14/2019  . Educated about COVID-19 virus infection 09/25/2018  . Erectile dysfunction 09/25/2018  . Intention tremor 03/09/2017  . Tingling of left upper extremity 03/09/2017  . Runner's knee, right 03/09/2017  . Hepatic steatosis 03/09/2017  . Hearing loss due to cerumen impaction 03/08/2017  . Encounter for preventive health examination 05/26/2015  . SVT (supraventricular tachycardia) ([facility]) 02/07/2015  . History of syncope 03/06/2014  . PVC's (premature ventricular contractions) 03/06/2014  . Osteoarthritis 02/08/2012  . Prediabetes 08/06/2011  . Overweight (BMI 25.0-29.9) 02/06/2011  . Family history of colon cancer requiring screening colonoscopy 02/05/2011  . Mixed hyperlipidemia 08/14/2008  . HYPERTENSION, BENIGN 08/14/2008   Past Medical History:  Diagnosis Date  . Arthritis    ? Gout  .  Bilateral carpal tunnel syndrome    managed with nocturnal braces x 3 years  . COPD (chronic obstructive pulmonary disease) ([facility])   . Dysrhythmia    Tachycardia, PVCs, SVTs  . GERD (gastroesophageal reflux disease)   . Glucose intolerance (impaired glucose tolerance)   . Heart murmur    as a child  . History of syncope 2005   Unexplained, myoview negative  . Hyperlipidemia   . Hypertension   . Iron deficiency anemia   . Osteoarthritis of multiple joints   . Palpitations 2010   Life Watch with single PVC  . Pre-diabetes     Family History  Problem Relation Age of Onset  . Coronary artery disease Mother   . Heart attack Mother        MI  . Heart failure Father        CHF  . Colon cancer Brother   . Colon cancer Paternal Grandmother     Past Surgical History:  Procedure Laterality Date  . APPENDECTOMY     1957  . COLONOSCOPY    . TONSILLECTOMY     1952  . TOTAL KNEE ARTHROPLASTY Right 03/14/2019   Procedure: RIGHT TOTAL KNEE ARTHROPLASTY;  Surgeon: Meredith Pel, MD;  Location: [facility];  Service: Orthopedics;  Laterality: Right;   Social History   Occupational History  . Not on file  Tobacco Use  . Smoking status: Never Smoker  . Smokeless tobacco: Never Used  Substance and  Sexual Activity  . Alcohol use: Yes    Alcohol/week: 14.0 standard drinks    Types: 14 Standard drinks or equivalent per week    Comment: 2 glasses of wine a day  . Drug use: No  . Sexual activity: Yes

## 2019-05-04 ENCOUNTER — Telehealth: Payer: Self-pay | Admitting: Orthopedic Surgery

## 2019-05-04 NOTE — Telephone Encounter (Signed)
Patient called wanting to get copy of the OP note from 10/20 surgery. He will come by tomorrow to pickup and sign release form

## 2019-06-05 ENCOUNTER — Encounter: Payer: Self-pay | Admitting: Orthopedic Surgery

## 2019-06-05 DIAGNOSIS — Z96651 Presence of right artificial knee joint: Secondary | ICD-10-CM | POA: Diagnosis not present

## 2019-06-05 DIAGNOSIS — R2689 Other abnormalities of gait and mobility: Secondary | ICD-10-CM | POA: Diagnosis not present

## 2019-06-05 DIAGNOSIS — M25561 Pain in right knee: Secondary | ICD-10-CM | POA: Diagnosis not present

## 2019-06-07 ENCOUNTER — Ambulatory Visit (INDEPENDENT_AMBULATORY_CARE_PROVIDER_SITE_OTHER): Payer: TRICARE For Life (TFL) | Admitting: Orthopedic Surgery

## 2019-06-07 ENCOUNTER — Other Ambulatory Visit: Payer: Self-pay

## 2019-06-07 ENCOUNTER — Encounter: Payer: Self-pay | Admitting: Orthopedic Surgery

## 2019-06-07 DIAGNOSIS — Z96651 Presence of right artificial knee joint: Secondary | ICD-10-CM

## 2019-06-07 DIAGNOSIS — M25561 Pain in right knee: Secondary | ICD-10-CM | POA: Diagnosis not present

## 2019-06-07 DIAGNOSIS — R2689 Other abnormalities of gait and mobility: Secondary | ICD-10-CM | POA: Diagnosis not present

## 2019-06-08 ENCOUNTER — Encounter: Payer: Self-pay | Admitting: Orthopedic Surgery

## 2019-06-08 ENCOUNTER — Other Ambulatory Visit: Payer: Self-pay | Admitting: Surgical

## 2019-06-08 MED ORDER — AMOXICILLIN 500 MG PO TABS: ORAL_TABLET | ORAL | 0 refills | Status: DC

## 2019-06-08 MED ORDER — TIZANIDINE HCL 4 MG PO TABS: 4.0000 mg | ORAL_TABLET | Freq: Three times a day (TID) | ORAL | 0 refills | Status: DC | PRN

## 2019-06-08 NOTE — Progress Notes (Signed)
Post-Op Visit Note   Patient: Mark Farley.           Date of Birth: Dec 24, 1945           MRN: [id number] Visit Date: 06/07/2019 PCP: Sherlene Shams, MD   Assessment & Plan:  Chief Complaint:  Chief Complaint  Patient presents with  . Follow-up   Visit Diagnoses:  1. S/P total knee arthroplasty, right     Plan: Mark Farley is a patient is now 3 months out right total knee replacement.  He is doing well.  Takes Ultram as needed.  Dry needling helped him.  He is having continued left knee pain as well.  He would like to get the left knee replaced.  He would like to do that in February.  He is taking tramadol for pain.  On examination he has range of motion from about 5 to 113 degrees with physical therapy.  Bike is okay stairs are okay.  Plan is that his right knee is doing well and will schedule him for left total knee replacement in February.  Risk and benefits discussed which are essentially the same as with his right-hand side.  All questions answered  Follow-Up Instructions: No follow-ups on file.   Orders:  No orders of the defined types were placed in this encounter.  No orders of the defined types were placed in this encounter.   Imaging: No results found.  PMFS History: Patient Active Problem List   Diagnosis Date Noted  . S/P total knee arthroplasty, right 03/14/2019  . Educated about COVID-19 virus infection 09/25/2018  . Erectile dysfunction 09/25/2018  . Intention tremor 03/09/2017  . Tingling of left upper extremity 03/09/2017  . Runner's knee, right 03/09/2017  . Hepatic steatosis 03/09/2017  . Hearing loss due to cerumen impaction 03/08/2017  . Encounter for preventive health examination 05/26/2015  . SVT (supraventricular tachycardia) ([facility]) 02/07/2015  . History of syncope 03/06/2014  . PVC's (premature ventricular contractions) 03/06/2014  . Osteoarthritis 02/08/2012  . Prediabetes 08/06/2011  . Overweight (BMI 25.0-29.9) 02/06/2011  . Family  history of colon cancer requiring screening colonoscopy 02/05/2011  . Mixed hyperlipidemia 08/14/2008  . HYPERTENSION, BENIGN 08/14/2008   Past Medical History:  Diagnosis Date  . Arthritis    ? Gout  . Bilateral carpal tunnel syndrome    managed with nocturnal braces x 3 years  . COPD (chronic obstructive pulmonary disease) (HCC)   . Dysrhythmia    Tachycardia, PVCs, SVTs  . GERD (gastroesophageal reflux disease)   . Glucose intolerance (impaired glucose tolerance)   . Heart murmur    as a child  . History of syncope 2005   Unexplained, myoview negative  . Hyperlipidemia   . Hypertension   . Iron deficiency anemia   . Osteoarthritis of multiple joints   . Palpitations 2010   Life Watch with single PVC  . Pre-diabetes     Family History  Problem Relation Age of Onset  . Coronary artery disease Mother   . Heart attack Mother        MI  . Heart failure Father        CHF  . Colon cancer Brother   . Colon cancer Paternal Grandmother     Past Surgical History:  Procedure Laterality Date  . APPENDECTOMY     1957  . COLONOSCOPY    . TONSILLECTOMY     1952  . TOTAL KNEE ARTHROPLASTY Right 03/14/2019   Procedure: RIGHT TOTAL KNEE  ARTHROPLASTY;  Surgeon: Meredith Pel, MD;  Location: [facility];  Service: Orthopedics;  Laterality: Right;   Social History   Occupational History  . Not on file  Tobacco Use  . Smoking status: Never Smoker  . Smokeless tobacco: Never Used  Substance and Sexual Activity  . Alcohol use: Yes    Alcohol/week: 14.0 standard drinks    Types: 14 Standard drinks or equivalent per week    Comment: 2 glasses of wine a day  . Drug use: No  . Sexual activity: Yes

## 2019-06-08 NOTE — Telephone Encounter (Signed)
Ok for amox 2 g po 1 hour before and mr of your choosing thx

## 2019-06-12 ENCOUNTER — Other Ambulatory Visit: Payer: Self-pay

## 2019-06-12 DIAGNOSIS — R2689 Other abnormalities of gait and mobility: Secondary | ICD-10-CM | POA: Diagnosis not present

## 2019-06-12 DIAGNOSIS — M25561 Pain in right knee: Secondary | ICD-10-CM | POA: Diagnosis not present

## 2019-06-12 DIAGNOSIS — Z96651 Presence of right artificial knee joint: Secondary | ICD-10-CM | POA: Diagnosis not present

## 2019-06-14 DIAGNOSIS — R2689 Other abnormalities of gait and mobility: Secondary | ICD-10-CM | POA: Diagnosis not present

## 2019-06-14 DIAGNOSIS — M25561 Pain in right knee: Secondary | ICD-10-CM | POA: Diagnosis not present

## 2019-06-14 DIAGNOSIS — Z96651 Presence of right artificial knee joint: Secondary | ICD-10-CM | POA: Diagnosis not present

## 2019-06-16 DIAGNOSIS — Z96651 Presence of right artificial knee joint: Secondary | ICD-10-CM | POA: Diagnosis not present

## 2019-06-16 DIAGNOSIS — M25561 Pain in right knee: Secondary | ICD-10-CM | POA: Diagnosis not present

## 2019-06-16 DIAGNOSIS — R2689 Other abnormalities of gait and mobility: Secondary | ICD-10-CM | POA: Diagnosis not present

## 2019-06-16 NOTE — Pre-Procedure Instructions (Signed)
Your procedure is scheduled on Thursday, January 21st, from 12:00 PM to 3:00 PM .  Report to [facility] Main Entrance "A" at 10:00 A.M., and check in at the Admitting office.  Call this number if you have problems the morning of surgery:  [phone]  Call [phone] if you have any questions prior to your surgery date Monday-Friday 8am-4pm    Remember:  Do not eat after midnight the night before your surgery.  You may drink clear liquids until 09:00 AM the morning of your surgery.    Clear liquids allowed are: Water, Non-Citrus Juices (without pulp), Carbonated Beverages, Clear Tea, Black Coffee Only, and Gatorade.  Please complete your PRE-SURGERY 10 oz Water that was provided to you by 09:00 AM the morning of surgery.  Please, if able, drink it in one setting. DO NOT SIP.     Take these medicines the morning of surgery with A SIP OF WATER : omeprazole (PRILOSEC) traMADol (ULTRAM)  diltiazem (CARDIZEM)- if needed HYDROcodone-acetaminophen (NORCO/VICODIN)- if needed tiZANidine (ZANAFLEX)- if needed  Follow your surgeon's instructions on when to stop Aspirin.  If no instructions were given by your surgeon then you will need to call the office to get those instructions.     As of today, STOP taking any NSAIDs such as celecoxib (CELEBREX), Aleve, Naproxen, Ibuprofen, Motrin, Advil, Goody's, BC's, all herbal medications, fish oil, and all vitamins.    The Morning of Surgery  Do not wear jewelry.  Do not wear lotions, powders, colognes, or deodorant  Men may shave face and neck.  Do not bring valuables to the hospital.  [facility] is not responsible for any belongings or valuables.  If you are a smoker, DO NOT Smoke 24 hours prior to surgery  If you wear a CPAP at night please bring your mask the morning of surgery   Remember that you must have someone to transport you home after your surgery, and remain with you for 24 hours if you are discharged the same  day.   Please bring cases for contacts, glasses, hearing aids, dentures or bridgework because it cannot be worn into surgery.    Leave your suitcase in the car.  After surgery it may be brought to your room.  For patients admitted to the hospital, discharge time will be determined by your treatment team.  Patients discharged the day of surgery will not be allowed to drive home.    Special instructions:   [place]- Preparing For Surgery  Before surgery, you can play an important role. Because skin is not sterile, your skin needs to be as free of germs as possible. You can reduce the number of germs on your skin by washing with CHG (chlorahexidine gluconate) Soap before surgery.  CHG is an antiseptic cleaner which kills germs and bonds with the skin to continue killing germs even after washing.    Oral Hygiene is also important to reduce your risk of infection.  Remember - BRUSH YOUR TEETH THE MORNING OF SURGERY WITH YOUR REGULAR TOOTHPASTE  Please do not use if you have an allergy to CHG or antibacterial soaps. If your skin becomes reddened/irritated stop using the CHG.  Do not shave (including legs and underarms) for at least 48 hours prior to first CHG shower. It is OK to shave your face.  Please follow these instructions carefully.   1. Shower the NIGHT BEFORE SURGERY and the MORNING OF SURGERY with CHG Soap.   2. If you chose to wash  your hair, wash your hair first as usual with your normal shampoo.  3. After you shampoo, rinse your hair and body thoroughly to remove the shampoo.  4. Use CHG as you would any other liquid soap. You can apply CHG directly to the skin and wash gently with a scrungie or a clean washcloth.   5. Apply the CHG Soap to your body ONLY FROM THE NECK DOWN.  Do not use on open wounds or open sores. Avoid contact with your eyes, ears, mouth and genitals (private parts). Wash Face and genitals (private parts)  with your normal soap.   6. Wash thoroughly,  paying special attention to the area where your surgery will be performed.  7. Thoroughly rinse your body with warm water from the neck down.  8. DO NOT shower/wash with your normal soap after using and rinsing off the CHG Soap.  9. Pat yourself dry with a CLEAN TOWEL.  10. Wear CLEAN PAJAMAS to bed the night before surgery, wear comfortable clothes the morning of surgery  11. Place CLEAN SHEETS on your bed the night of your first shower and DO NOT SLEEP WITH PETS.    Day of Surgery:  Please shower the morning of surgery with the CHG soap Do not apply any deodorants/lotions. Please wear clean clothes to the hospital/surgery center.   Remember to brush your teeth WITH YOUR REGULAR TOOTHPASTE.   Please read over the following fact sheets that you were given.

## 2019-06-19 ENCOUNTER — Other Ambulatory Visit: Payer: Self-pay

## 2019-06-19 ENCOUNTER — Encounter (HOSPITAL_COMMUNITY): Payer: Self-pay

## 2019-06-19 ENCOUNTER — Other Ambulatory Visit (HOSPITAL_COMMUNITY)
Admission: RE | Admit: 2019-06-19 | Discharge: 2019-06-19 | Disposition: A | Discharge status: Home / Self Care | Payer: Medicare PPO | Source: Ambulatory Visit | Attending: Orthopedic Surgery | Admitting: Orthopedic Surgery

## 2019-06-19 ENCOUNTER — Encounter (HOSPITAL_COMMUNITY)
Admission: RE | Admit: 2019-06-19 | Discharge: 2019-06-19 | Disposition: A | Discharge status: Home / Self Care | Payer: Medicare PPO | Source: Ambulatory Visit | Attending: Orthopedic Surgery | Admitting: Orthopedic Surgery

## 2019-06-19 DIAGNOSIS — J449 Chronic obstructive pulmonary disease, unspecified: Secondary | ICD-10-CM | POA: Diagnosis not present

## 2019-06-19 DIAGNOSIS — Z79899 Other long term (current) drug therapy: Secondary | ICD-10-CM | POA: Insufficient documentation

## 2019-06-19 DIAGNOSIS — D509 Iron deficiency anemia, unspecified: Secondary | ICD-10-CM | POA: Diagnosis not present

## 2019-06-19 DIAGNOSIS — K219 Gastro-esophageal reflux disease without esophagitis: Secondary | ICD-10-CM | POA: Diagnosis not present

## 2019-06-19 DIAGNOSIS — R7303 Prediabetes: Secondary | ICD-10-CM | POA: Insufficient documentation

## 2019-06-19 DIAGNOSIS — Z20822 Contact with and (suspected) exposure to covid-19: Secondary | ICD-10-CM | POA: Diagnosis not present

## 2019-06-19 DIAGNOSIS — M1712 Unilateral primary osteoarthritis, left knee: Secondary | ICD-10-CM | POA: Diagnosis not present

## 2019-06-19 DIAGNOSIS — R011 Cardiac murmur, unspecified: Secondary | ICD-10-CM | POA: Insufficient documentation

## 2019-06-19 DIAGNOSIS — Z7982 Long term (current) use of aspirin: Secondary | ICD-10-CM | POA: Insufficient documentation

## 2019-06-19 DIAGNOSIS — M25561 Pain in right knee: Secondary | ICD-10-CM | POA: Diagnosis not present

## 2019-06-19 DIAGNOSIS — E039 Hypothyroidism, unspecified: Secondary | ICD-10-CM | POA: Diagnosis not present

## 2019-06-19 DIAGNOSIS — Z01818 Encounter for other preprocedural examination: Secondary | ICD-10-CM | POA: Insufficient documentation

## 2019-06-19 DIAGNOSIS — R2689 Other abnormalities of gait and mobility: Secondary | ICD-10-CM | POA: Diagnosis not present

## 2019-06-19 DIAGNOSIS — Z7901 Long term (current) use of anticoagulants: Secondary | ICD-10-CM | POA: Insufficient documentation

## 2019-06-19 DIAGNOSIS — I10 Essential (primary) hypertension: Secondary | ICD-10-CM | POA: Diagnosis not present

## 2019-06-19 DIAGNOSIS — E785 Hyperlipidemia, unspecified: Secondary | ICD-10-CM | POA: Insufficient documentation

## 2019-06-19 DIAGNOSIS — Z01812 Encounter for preprocedural laboratory examination: Secondary | ICD-10-CM | POA: Insufficient documentation

## 2019-06-19 DIAGNOSIS — Z96651 Presence of right artificial knee joint: Secondary | ICD-10-CM | POA: Diagnosis not present

## 2019-06-19 LAB — SURGICAL PCR SCREEN
MRSA, PCR: NEGATIVE
Staphylococcus aureus: POSITIVE — AB

## 2019-06-19 LAB — CBC
HCT: 43.8 % (ref 39.0–52.0)
Hemoglobin: 14.9 g/dL (ref 13.0–17.0)
MCH: 30.1 pg (ref 26.0–34.0)
MCHC: 34 g/dL (ref 30.0–36.0)
MCV: 88.5 fL (ref 80.0–100.0)
Platelets: 192 10*3/uL (ref 150–400)
RBC: 4.95 MIL/uL (ref 4.22–5.81)
RDW: 12.7 % (ref 11.5–15.5)
WBC: 5.7 10*3/uL (ref 4.0–10.5)
nRBC: 0 % (ref 0.0–0.2)

## 2019-06-19 LAB — BASIC METABOLIC PANEL
Anion gap: 11 (ref 5–15)
BUN: 13 mg/dL (ref 8–23)
CO2: 29 mmol/L (ref 22–32)
Calcium: 9.5 mg/dL (ref 8.9–10.3)
Chloride: 93 mmol/L — ABNORMAL LOW (ref 98–111)
Creatinine, Ser: 0.84 mg/dL (ref 0.61–1.24)
GFR calc Af Amer: >60 mL/min (ref >60)
GFR calc non Af Amer: >60 mL/min (ref >60)
Glucose, Bld: 118 mg/dL — ABNORMAL HIGH (ref 70–99)
Potassium: 4 mmol/L (ref 3.5–5.1)
Sodium: 133 mmol/L — ABNORMAL LOW (ref 135–145)

## 2019-06-19 MED ORDER — POVIDONE-IODINE 10 % EX SWAB: 2.0000 "application " | Freq: Once | CUTANEOUS | Status: DC

## 2019-06-19 NOTE — Progress Notes (Signed)
PCP - Dr Shepard General Cardiologist - Dr Mariah Milling   [place]  hest x-ray - na EKG -10/20  Stress Test - 2004 ECHO - na   Cardiac Cath - na  -    Fasting Blood Sugar - not done Checks Blood Sugar __1___ times a day   Aspirin Instructions:stop  ERAS Protcol -yes PRE-SURGERY Ensure or G2-  water  COVID TEST- today   Anesthesia review: review heart murmur  Patient denies shortness of breath, fever, cough and chest pain at PAT appointment   All instructions explained to the patient, with a verbal understanding of the material. Patient agrees to go over the instructions while at home for a better understanding. Patient also instructed to self quarantine after being tested for COVID-19. The opportunity to ask questions was provided.

## 2019-06-20 ENCOUNTER — Encounter: Payer: Self-pay | Admitting: Orthopedic Surgery

## 2019-06-20 LAB — NOVEL CORONAVIRUS, NAA (HOSP ORDER, SEND-OUT TO REF LAB; TAT 18-24 HRS): SARS-CoV-2, NAA: NOT DETECTED

## 2019-06-20 NOTE — Progress Notes (Signed)
Anesthesia Chart Review:  Case: [id number] Date/Time: 06/22/19 1145   Procedure: LEFT TOTAL KNEE ARTHROPLASTY (Left Knee)   Anesthesia type: Spinal   Pre-op diagnosis: left knee osteoarthritis   [facility] / [facility]   Surgeons: Cammy Copa, MD      DISCUSSION: Patient is a 74 year old male scheduled for the above procedure.  History includes never smoker, HTN, prediabetes, GERD, childhood murmur, dysrhythmia/palpitations (tachycardia, PVCs, PACs, SVT), iron deficiency anemia, HLD, syncope (2005), COPD (no PFTs documented in Epic or discussion of COPD in primary care notes reviewed). S/p right TKA 03/14/19. 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   He is followed by cardiologist Dr. Mariah Milling, for history of SVT, as well as for risk factor modification. Remote history of syncope with negative Myoview in 2005. History of palpitations and had a Life Watch monitor for several weeks but no events. Monitor only caught a single PVC. Previous Holter monitor in 2016 showing runs of SVT, rate up to 150 bpm. SVT treated with metoprolol and PRN diltiazem. Last evaluation by Dr. Mariah Milling 09/29/18. Per note, he denied having significant arrhythmia. He reported not needing to take diltiazem.  06/19/19 presurgical COVID-19 test in process.  He tolerated right TKA in October. Anesthesia team to evaluate on the day of surgery.   VS: BP 136/82   Pulse 73   Temp 36.7 C   Resp 18   Ht 5' 6.5" (1.689 m)   Wt 85.6 kg   SpO2 97%   BMI 30.02 kg/m   PROVIDERS: Sherlene Shams, MD his PCP. Last evaluation 04/13/19. He is a Cytogeneticist.  Julien Nordmann, MD is cardiologist   LABS: Labs reviewed: Acceptable for surgery. LFTs WNL 04/10/19. A1c 5.7% 03/10/19. (all labs ordered are listed, but only abnormal results are displayed)  Labs Reviewed  SURGICAL PCR SCREEN - Abnormal; Notable for the following components:      Result Value   Staphylococcus aureus POSITIVE (*)    All other components  within normal limits  BASIC METABOLIC PANEL - Abnormal; Notable for the following components:   Sodium 133 (*)    Chloride 93 (*)    Glucose, Bld 118 (*)    All other components within normal limits  CBC    EKG: 03/10/19: NSR   CV: BLE Venous US 04/03/19: Summary: Right: There is no evidence of deep vein thrombosis in the lower extremity. However, portions of this examination were limited- see technologist comments above. No cystic structure found in the popliteal fossa. Left: No evidence of common femoral vein obstruction.   CT Cardiac scoring 12/06/18: FINDINGS: - Non-cardiac: See separate report from [facility]. - Ascending Aorta: Upper normal size measuring 39 mm with mild diffuse calcifications. - Pericardium: Normal. - Coronary arteries: Normal origin. IMPRESSION: Coronary calcium score of 638. This was 16 percentile for age and sex matched control. - Per Dr. Mariah Milling, LDL goal < 70. Crestor and Zetia prescribed    48 hour Holter monitor 01/16/15-01/18/15: Normal sinus rhythm. Frequent PACs and runs of SVT. Rare PVCs. Patient's events correlated with PACs and SVT. - Metoprolol dose increased   Past Medical History:  Diagnosis Date  . Arthritis    ? Gout  . Bilateral carpal tunnel syndrome    managed with nocturnal braces x 3 years  . COPD (chronic obstructive pulmonary disease) (HCC)   . Dysrhythmia    Tachycardia, PVCs, SVTs  . GERD (gastroesophageal reflux disease)   . Glucose intolerance (impaired glucose  tolerance)   . Heart murmur    as a child  . History of syncope 2005   Unexplained, myoview negative  . Hyperlipidemia   . Hypertension   . Iron deficiency anemia   . Osteoarthritis of multiple joints   . Palpitations 2010   Life Watch with single PVC  . Pre-diabetes     Past Surgical History:  Procedure Laterality Date  . APPENDECTOMY     1957  . COLONOSCOPY    . JOINT REPLACEMENT    . TONSILLECTOMY     1952  . TOTAL KNEE  ARTHROPLASTY Right 03/14/2019   Procedure: RIGHT TOTAL KNEE ARTHROPLASTY;  Surgeon: Meredith Pel, MD;  Location: [place];  Service: Orthopedics;  Laterality: Right;    MEDICATIONS: . amoxicillin (AMOXIL) 500 MG tablet  . aspirin 81 MG chewable tablet  . celecoxib (CELEBREX) 100 MG capsule  . clotrimazole (LOTRIMIN) 1 % cream  . diltiazem (CARDIZEM) 30 MG tablet  . HYDROcodone-acetaminophen (NORCO/VICODIN) 5-325 MG tablet  . hydrocortisone cream 0.5 %  . metoprolol succinate (TOPROL-XL) 50 MG 24 hr tablet  . Omega-3 Fatty Acids (FISH OIL) 1000 MG CAPS  . omeprazole (PRILOSEC) 20 MG capsule  . rosuvastatin (CRESTOR) 20 MG tablet  . sildenafil (REVATIO) 20 MG tablet  . telmisartan-hydrochlorothiazide (MICARDIS HCT) 80-25 MG tablet  . tiZANidine (ZANAFLEX) 4 MG tablet  . traMADol (ULTRAM) 50 MG tablet  . traZODone (DESYREL) 50 MG tablet  . valACYclovir (VALTREX) 1000 MG tablet   No current facility-administered medications for this encounter.  Diltiazem is prescribed 30 mg QID PRN palpitations.   Myra Gianotti, PA-C Surgical Short Stay/Anesthesiology [facility] Phone [phone] [facility] Phone [phone] 06/20/2019 1:45 PM

## 2019-06-20 NOTE — Anesthesia Preprocedure Evaluation (Addendum)
Anesthesia Evaluation  Patient identified by MRN, date of birth, ID band Patient awake    Reviewed: Allergy & Precautions, H&P , NPO status , Patient's Chart, lab work & pertinent test results  Airway Mallampati: II   Neck ROM: full    Dental   Pulmonary COPD,    breath sounds clear to auscultation       Cardiovascular hypertension, + dysrhythmias Supra Ventricular Tachycardia  Rhythm:regular Rate:Normal     Neuro/Psych  Neuromuscular disease    GI/Hepatic GERD  ,  Endo/Other    Renal/GU      Musculoskeletal  (+) Arthritis ,   Abdominal   Peds  Hematology   Anesthesia Other Findings   Reproductive/Obstetrics                            Anesthesia Physical Anesthesia Plan  ASA: II  Anesthesia Plan: Spinal and MAC   Post-op Pain Management:  Regional for Post-op pain   Induction: Intravenous  PONV Risk Score and Plan: 1 and Propofol infusion, Ondansetron and Treatment may vary due to age or medical condition  Airway Management Planned: Simple Face Mask  Additional Equipment:   Intra-op Plan:   Post-operative Plan:   Informed Consent: I have reviewed the patients History and Physical, chart, labs and discussed the procedure including the risks, benefits and alternatives for the proposed anesthesia with the patient or authorized representative who has indicated his/her understanding and acceptance.       Plan Discussed with: CRNA, Anesthesiologist and Surgeon  Anesthesia Plan Comments: (PAT note written 06/20/2019 by Myra Gianotti, PA-C. )       Anesthesia Quick Evaluation

## 2019-06-21 DIAGNOSIS — R2689 Other abnormalities of gait and mobility: Secondary | ICD-10-CM | POA: Diagnosis not present

## 2019-06-21 DIAGNOSIS — M25561 Pain in right knee: Secondary | ICD-10-CM | POA: Diagnosis not present

## 2019-06-21 DIAGNOSIS — Z96651 Presence of right artificial knee joint: Secondary | ICD-10-CM | POA: Diagnosis not present

## 2019-06-21 MED ORDER — TRANEXAMIC ACID 1000 MG/10ML IV SOLN
2000.0000 mg | INTRAVENOUS | Status: AC
  Administered 2019-06-22: 2000 mg via TOPICAL
  Filled 2019-06-21: qty 20

## 2019-06-21 NOTE — Telephone Encounter (Signed)
Sounds good, it should be the same order set as usual postop.  I'll make sure it happens

## 2019-06-21 NOTE — Telephone Encounter (Signed)
Sounds good to get home health PT as well as CPM machine from both knees.  He can use the 1 machine for the other knee.  Franky Macho can you order home health PT for after he gets home thanks

## 2019-06-22 ENCOUNTER — Inpatient Hospital Stay (HOSPITAL_COMMUNITY): Payer: Medicare PPO | Admitting: Vascular Surgery

## 2019-06-22 ENCOUNTER — Encounter (HOSPITAL_COMMUNITY): Payer: Self-pay | Admitting: Orthopedic Surgery

## 2019-06-22 ENCOUNTER — Encounter (HOSPITAL_COMMUNITY)
Admission: RE | Disposition: A | Discharge status: Home / Self Care | Payer: Self-pay | Source: Home / Self Care | Attending: Orthopedic Surgery

## 2019-06-22 ENCOUNTER — Inpatient Hospital Stay (HOSPITAL_COMMUNITY)
Admission: RE | Admit: 2019-06-22 | Discharge: 2019-06-24 | DRG: 470 | Disposition: A | Discharge status: Home / Self Care | Payer: Medicare PPO | Attending: Orthopedic Surgery | Admitting: Orthopedic Surgery

## 2019-06-22 ENCOUNTER — Other Ambulatory Visit: Payer: Self-pay

## 2019-06-22 ENCOUNTER — Inpatient Hospital Stay (HOSPITAL_COMMUNITY): Payer: Medicare PPO | Admitting: Anesthesiology

## 2019-06-22 DIAGNOSIS — K219 Gastro-esophageal reflux disease without esophagitis: Secondary | ICD-10-CM | POA: Diagnosis present

## 2019-06-22 DIAGNOSIS — Z96651 Presence of right artificial knee joint: Secondary | ICD-10-CM | POA: Diagnosis not present

## 2019-06-22 DIAGNOSIS — Z79899 Other long term (current) drug therapy: Secondary | ICD-10-CM | POA: Diagnosis not present

## 2019-06-22 DIAGNOSIS — E782 Mixed hyperlipidemia: Secondary | ICD-10-CM | POA: Diagnosis not present

## 2019-06-22 DIAGNOSIS — Z791 Long term (current) use of non-steroidal anti-inflammatories (NSAID): Secondary | ICD-10-CM

## 2019-06-22 DIAGNOSIS — Z7982 Long term (current) use of aspirin: Secondary | ICD-10-CM

## 2019-06-22 DIAGNOSIS — J449 Chronic obstructive pulmonary disease, unspecified: Secondary | ICD-10-CM | POA: Diagnosis not present

## 2019-06-22 DIAGNOSIS — I10 Essential (primary) hypertension: Secondary | ICD-10-CM | POA: Diagnosis not present

## 2019-06-22 DIAGNOSIS — G8918 Other acute postprocedural pain: Secondary | ICD-10-CM | POA: Diagnosis not present

## 2019-06-22 DIAGNOSIS — M1712 Unilateral primary osteoarthritis, left knee: Secondary | ICD-10-CM | POA: Diagnosis not present

## 2019-06-22 DIAGNOSIS — Z96652 Presence of left artificial knee joint: Secondary | ICD-10-CM | POA: Diagnosis not present

## 2019-06-22 HISTORY — PX: TOTAL KNEE ARTHROPLASTY: SHX125

## 2019-06-22 LAB — GLUCOSE, CAPILLARY: Glucose-Capillary: 104 mg/dL — ABNORMAL HIGH (ref 70–99)

## 2019-06-22 SURGERY — ARTHROPLASTY, KNEE, TOTAL
Anesthesia: Monitor Anesthesia Care | Site: Knee | Laterality: Left

## 2019-06-22 MED ORDER — FENTANYL CITRATE (PF) 100 MCG/2ML IJ SOLN: 25.0000 ug | INTRAMUSCULAR | Status: DC | PRN

## 2019-06-22 MED ORDER — PHENOL 1.4 % MT LIQD: 1.0000 | OROMUCOSAL | Status: DC | PRN

## 2019-06-22 MED ORDER — ONDANSETRON HCL 4 MG/2ML IJ SOLN: 4.0000 mg | Freq: Four times a day (QID) | INTRAMUSCULAR | Status: DC | PRN

## 2019-06-22 MED ORDER — OXYCODONE HCL 5 MG/5ML PO SOLN: 5.0000 mg | Freq: Once | ORAL | Status: DC | PRN

## 2019-06-22 MED ORDER — BUPIVACAINE HCL (PF) 0.25 % IJ SOLN
INTRAMUSCULAR | Status: AC
  Filled 2019-06-22: qty 30

## 2019-06-22 MED ORDER — ROSUVASTATIN CALCIUM 5 MG PO TABS
10.0000 mg | ORAL_TABLET | Freq: Every day | ORAL | Status: DC
  Administered 2019-06-22 – 2019-06-23 (×2): 10 mg via ORAL
  Filled 2019-06-22 (×2): qty 2

## 2019-06-22 MED ORDER — BUPIVACAINE HCL 0.25 % IJ SOLN
INTRAMUSCULAR | Status: DC | PRN
  Administered 2019-06-22: 10 mL
  Administered 2019-06-22: 20 mL

## 2019-06-22 MED ORDER — PROPOFOL 500 MG/50ML IV EMUL
INTRAVENOUS | Status: DC | PRN
  Administered 2019-06-22: 75 ug/kg/min via INTRAVENOUS

## 2019-06-22 MED ORDER — HYDROCODONE-ACETAMINOPHEN 5-325 MG PO TABS
1.0000 | ORAL_TABLET | ORAL | Status: DC | PRN
  Administered 2019-06-22 – 2019-06-24 (×6): 2 via ORAL
  Filled 2019-06-22 (×6): qty 2

## 2019-06-22 MED ORDER — FENTANYL CITRATE (PF) 100 MCG/2ML IJ SOLN: 100.0000 ug | Freq: Once | INTRAMUSCULAR | Status: AC

## 2019-06-22 MED ORDER — METOPROLOL SUCCINATE ER 50 MG PO TB24
50.0000 mg | ORAL_TABLET | Freq: Every day | ORAL | Status: DC
  Administered 2019-06-23 – 2019-06-24 (×2): 50 mg via ORAL
  Filled 2019-06-22 (×2): qty 1

## 2019-06-22 MED ORDER — HYDROMORPHONE HCL 1 MG/ML IJ SOLN
0.5000 mg | INTRAMUSCULAR | Status: DC | PRN
  Administered 2019-06-22: 18:00:00 0.5 mg via INTRAVENOUS
  Filled 2019-06-22 (×2): qty 1

## 2019-06-22 MED ORDER — SODIUM CHLORIDE 0.9 % IR SOLN
Status: DC | PRN
  Administered 2019-06-22: 3000 mL

## 2019-06-22 MED ORDER — STERILE WATER FOR IRRIGATION IR SOLN
Status: DC | PRN
  Administered 2019-06-22 (×2): 1000 mL

## 2019-06-22 MED ORDER — DOCUSATE SODIUM 100 MG PO CAPS
100.0000 mg | ORAL_CAPSULE | Freq: Two times a day (BID) | ORAL | Status: DC
  Administered 2019-06-22 – 2019-06-24 (×4): 100 mg via ORAL
  Filled 2019-06-22 (×4): qty 1

## 2019-06-22 MED ORDER — CLONIDINE HCL (ANALGESIA) 100 MCG/ML EP SOLN
EPIDURAL | Status: DC | PRN
  Administered 2019-06-22: 1 mL

## 2019-06-22 MED ORDER — PROPOFOL 10 MG/ML IV BOLUS
INTRAVENOUS | Status: DC | PRN
  Administered 2019-06-22 (×2): 30 mg via INTRAVENOUS

## 2019-06-22 MED ORDER — CEFAZOLIN SODIUM-DEXTROSE 2-4 GM/100ML-% IV SOLN
INTRAVENOUS | Status: AC
  Filled 2019-06-22: qty 100

## 2019-06-22 MED ORDER — ACETAMINOPHEN 325 MG PO TABS
325.0000 mg | ORAL_TABLET | Freq: Four times a day (QID) | ORAL | Status: DC | PRN
  Administered 2019-06-22: 20:00:00 650 mg via ORAL
  Filled 2019-06-22: qty 2

## 2019-06-22 MED ORDER — BUPIVACAINE IN DEXTROSE 0.75-8.25 % IT SOLN
INTRATHECAL | Status: DC | PRN
  Administered 2019-06-22: 2 mL via INTRATHECAL

## 2019-06-22 MED ORDER — METHOCARBAMOL 1000 MG/10ML IJ SOLN
500.0000 mg | Freq: Four times a day (QID) | INTRAVENOUS | Status: DC | PRN
  Filled 2019-06-22: qty 5

## 2019-06-22 MED ORDER — CLONIDINE HCL (ANALGESIA) 100 MCG/ML EP SOLN
EPIDURAL | Status: AC
  Filled 2019-06-22: qty 10

## 2019-06-22 MED ORDER — TRANEXAMIC ACID-NACL 1000-0.7 MG/100ML-% IV SOLN
INTRAVENOUS | Status: AC
  Filled 2019-06-22: qty 100

## 2019-06-22 MED ORDER — MORPHINE SULFATE (PF) 4 MG/ML IV SOLN
INTRAVENOUS | Status: AC
  Filled 2019-06-22: qty 2

## 2019-06-22 MED ORDER — MENTHOL 3 MG MT LOZG: 1.0000 | LOZENGE | OROMUCOSAL | Status: DC | PRN

## 2019-06-22 MED ORDER — GABAPENTIN 300 MG PO CAPS
300.0000 mg | ORAL_CAPSULE | Freq: Three times a day (TID) | ORAL | Status: DC
  Administered 2019-06-22 – 2019-06-24 (×6): 300 mg via ORAL
  Filled 2019-06-22 (×6): qty 1

## 2019-06-22 MED ORDER — METOCLOPRAMIDE HCL 5 MG PO TABS: 5.0000 mg | ORAL_TABLET | Freq: Three times a day (TID) | ORAL | Status: DC | PRN

## 2019-06-22 MED ORDER — CELECOXIB 200 MG PO CAPS
200.0000 mg | ORAL_CAPSULE | Freq: Two times a day (BID) | ORAL | Status: DC
  Administered 2019-06-22 – 2019-06-24 (×4): 200 mg via ORAL
  Filled 2019-06-22 (×4): qty 1

## 2019-06-22 MED ORDER — CHLORHEXIDINE GLUCONATE 4 % EX LIQD: 60.0000 mL | Freq: Once | CUTANEOUS | Status: DC

## 2019-06-22 MED ORDER — BUPIVACAINE HCL (PF) 0.25 % IJ SOLN
INTRAMUSCULAR | Status: AC
  Filled 2019-06-22: qty 10

## 2019-06-22 MED ORDER — TRAZODONE HCL 50 MG PO TABS
25.0000 mg | ORAL_TABLET | Freq: Every evening | ORAL | Status: DC | PRN
  Administered 2019-06-23: 21:00:00 50 mg via ORAL
  Filled 2019-06-22: qty 1

## 2019-06-22 MED ORDER — PHENYLEPHRINE HCL-NACL 10-0.9 MG/250ML-% IV SOLN
INTRAVENOUS | Status: DC | PRN
  Administered 2019-06-22: 25 ug/min via INTRAVENOUS

## 2019-06-22 MED ORDER — MIDAZOLAM HCL 2 MG/2ML IJ SOLN
INTRAMUSCULAR | Status: AC
  Administered 2019-06-22: 11:00:00 2 mg via INTRAVENOUS
  Filled 2019-06-22: qty 2

## 2019-06-22 MED ORDER — CEFAZOLIN SODIUM-DEXTROSE 2-4 GM/100ML-% IV SOLN
2.0000 g | INTRAVENOUS | Status: AC
  Administered 2019-06-22: 2 g via INTRAVENOUS

## 2019-06-22 MED ORDER — IRRISEPT - 450ML BOTTLE WITH 0.05% CHG IN STERILE WATER, USP 99.95% OPTIME
TOPICAL | Status: DC | PRN
  Administered 2019-06-22: 450 mL

## 2019-06-22 MED ORDER — PANTOPRAZOLE SODIUM 40 MG PO TBEC
40.0000 mg | DELAYED_RELEASE_TABLET | Freq: Every day | ORAL | Status: DC
  Administered 2019-06-23 – 2019-06-24 (×2): 40 mg via ORAL
  Filled 2019-06-22 (×2): qty 1

## 2019-06-22 MED ORDER — METOCLOPRAMIDE HCL 5 MG/ML IJ SOLN: 5.0000 mg | Freq: Three times a day (TID) | INTRAMUSCULAR | Status: DC | PRN

## 2019-06-22 MED ORDER — OXYCODONE HCL 5 MG PO TABS: 5.0000 mg | ORAL_TABLET | Freq: Once | ORAL | Status: DC | PRN

## 2019-06-22 MED ORDER — METHOCARBAMOL 500 MG PO TABS
500.0000 mg | ORAL_TABLET | Freq: Four times a day (QID) | ORAL | Status: DC | PRN
  Administered 2019-06-22 – 2019-06-24 (×3): 500 mg via ORAL
  Filled 2019-06-22 (×4): qty 1

## 2019-06-22 MED ORDER — ONDANSETRON HCL 4 MG PO TABS
4.0000 mg | ORAL_TABLET | Freq: Four times a day (QID) | ORAL | Status: DC | PRN
  Administered 2019-06-23: 4 mg via ORAL
  Filled 2019-06-22: qty 1

## 2019-06-22 MED ORDER — MORPHINE SULFATE (PF) 4 MG/ML IV SOLN
INTRAVENOUS | Status: DC | PRN
  Administered 2019-06-22: 8 mg

## 2019-06-22 MED ORDER — LACTATED RINGERS IV SOLN: INTRAVENOUS | Status: DC

## 2019-06-22 MED ORDER — ROPIVACAINE HCL 5 MG/ML IJ SOLN
INTRAMUSCULAR | Status: DC | PRN
  Administered 2019-06-22: 25 mL via PERINEURAL

## 2019-06-22 MED ORDER — SODIUM CHLORIDE 0.9% FLUSH
INTRAVENOUS | Status: DC | PRN
  Administered 2019-06-22: 20 mL

## 2019-06-22 MED ORDER — CEFAZOLIN SODIUM-DEXTROSE 2-4 GM/100ML-% IV SOLN
2.0000 g | Freq: Four times a day (QID) | INTRAVENOUS | Status: AC
  Administered 2019-06-22 – 2019-06-23 (×2): 2 g via INTRAVENOUS
  Filled 2019-06-22 (×2): qty 100

## 2019-06-22 MED ORDER — MIDAZOLAM HCL 2 MG/2ML IJ SOLN: 2.0000 mg | Freq: Once | INTRAMUSCULAR | Status: AC

## 2019-06-22 MED ORDER — PHENYLEPHRINE HCL (PRESSORS) 10 MG/ML IV SOLN
INTRAVENOUS | Status: DC | PRN
  Administered 2019-06-22: 100 ug via INTRAVENOUS

## 2019-06-22 MED ORDER — TRANEXAMIC ACID-NACL 1000-0.7 MG/100ML-% IV SOLN
INTRAVENOUS | Status: DC | PRN
  Administered 2019-06-22: 1000 mg via INTRAVENOUS

## 2019-06-22 MED ORDER — ASPIRIN 81 MG PO CHEW
81.0000 mg | CHEWABLE_TABLET | Freq: Two times a day (BID) | ORAL | Status: DC
  Administered 2019-06-22 – 2019-06-24 (×4): 81 mg via ORAL
  Filled 2019-06-22 (×4): qty 1

## 2019-06-22 MED ORDER — FENTANYL CITRATE (PF) 100 MCG/2ML IJ SOLN
INTRAMUSCULAR | Status: AC
  Administered 2019-06-22: 11:00:00 100 ug via INTRAVENOUS
  Filled 2019-06-22: qty 2

## 2019-06-22 MED ORDER — 0.9 % SODIUM CHLORIDE (POUR BTL) OPTIME
TOPICAL | Status: DC | PRN
  Administered 2019-06-22 (×3): 1000 mL

## 2019-06-22 MED ORDER — OXYCODONE HCL 5 MG PO TABS
5.0000 mg | ORAL_TABLET | ORAL | Status: DC | PRN
  Administered 2019-06-23: 15:00:00 10 mg via ORAL
  Filled 2019-06-22: qty 2

## 2019-06-22 MED ORDER — BUPIVACAINE LIPOSOME 1.3 % IJ SUSP
INTRAMUSCULAR | Status: DC | PRN
  Administered 2019-06-22: 20 mL

## 2019-06-22 MED ORDER — HYDROMORPHONE HCL 1 MG/ML IJ SOLN
1.0000 mg | INTRAMUSCULAR | Status: DC | PRN
  Administered 2019-06-22 – 2019-06-23 (×6): 1 mg via INTRAVENOUS
  Filled 2019-06-22 (×5): qty 1

## 2019-06-22 MED ORDER — EPHEDRINE SULFATE 50 MG/ML IJ SOLN
INTRAMUSCULAR | Status: DC | PRN
  Administered 2019-06-22: 5 mg via INTRAVENOUS

## 2019-06-22 SURGICAL SUPPLY — 77 items
BAG DECANTER FOR FLEXI CONT (MISCELLANEOUS) ×3 IMPLANT
BANDAGE ESMARK 6X9 LF (GAUZE/BANDAGES/DRESSINGS) ×1 IMPLANT
BLADE SAG 18X100X1.27 (BLADE) ×3 IMPLANT
BNDG COHESIVE 6X5 TAN STRL LF (GAUZE/BANDAGES/DRESSINGS) ×3 IMPLANT
BNDG ELASTIC 6X15 VLCR STRL LF (GAUZE/BANDAGES/DRESSINGS) ×3 IMPLANT
BNDG ESMARK 6X9 LF (GAUZE/BANDAGES/DRESSINGS) ×3
BOWL SMART MIX CTS (DISPOSABLE) IMPLANT
CLOSURE WOUND 1/2 X4 (GAUZE/BANDAGES/DRESSINGS) ×1
CONT SPEC 4OZ CLIKSEAL STRL BL (MISCELLANEOUS) ×3 IMPLANT
COVER SURGICAL LIGHT HANDLE (MISCELLANEOUS) ×3 IMPLANT
COVER WAND RF STERILE (DRAPES) ×3 IMPLANT
CUFF TOURN SGL QUICK 34 (TOURNIQUET CUFF) ×2
CUFF TOURN SGL QUICK 42 (TOURNIQUET CUFF) IMPLANT
CUFF TRNQT CYL 34X4.125X (TOURNIQUET CUFF) ×1 IMPLANT
DECANTER SPIKE VIAL GLASS SM (MISCELLANEOUS) ×3 IMPLANT
DRAPE INCISE IOBAN 66X45 STRL (DRAPES) IMPLANT
DRAPE ORTHO SPLIT 77X108 STRL (DRAPES) ×6
DRAPE SURG ORHT 6 SPLT 77X108 (DRAPES) ×3 IMPLANT
DRAPE U-SHAPE 47X51 STRL (DRAPES) ×3 IMPLANT
DRSG AQUACEL AG ADV 3.5X10 (GAUZE/BANDAGES/DRESSINGS) ×3 IMPLANT
DRSG AQUACEL AG ADV 3.5X14 (GAUZE/BANDAGES/DRESSINGS) ×3 IMPLANT
DURAPREP 26ML APPLICATOR (WOUND CARE) ×6 IMPLANT
ELECT CAUTERY BLADE 6.4 (BLADE) ×3 IMPLANT
ELECT REM PT RETURN 9FT ADLT (ELECTROSURGICAL) ×3
ELECTRODE REM PT RTRN 9FT ADLT (ELECTROSURGICAL) ×1 IMPLANT
FEMORAL RETAINING CRUC KNEE #4 (Knees) ×3 IMPLANT
GAUZE SPONGE 4X4 12PLY STRL (GAUZE/BANDAGES/DRESSINGS) ×3 IMPLANT
GLOVE BIOGEL PI IND STRL 7.0 (GLOVE) ×1 IMPLANT
GLOVE BIOGEL PI IND STRL 8 (GLOVE) ×1 IMPLANT
GLOVE BIOGEL PI INDICATOR 7.0 (GLOVE) ×2
GLOVE BIOGEL PI INDICATOR 8 (GLOVE) ×2
GLOVE ECLIPSE 7.0 STRL STRAW (GLOVE) ×3 IMPLANT
GLOVE ECLIPSE 8.0 STRL XLNG CF (GLOVE) ×3 IMPLANT
GOWN STRL REUS W/ TWL LRG LVL3 (GOWN DISPOSABLE) ×3 IMPLANT
GOWN STRL REUS W/TWL LRG LVL3 (GOWN DISPOSABLE) ×6
HANDPIECE INTERPULSE COAX TIP (DISPOSABLE) ×2
HOOD PEEL AWAY FLYTE STAYCOOL (MISCELLANEOUS) ×9 IMPLANT
IMMOBILIZER KNEE 20 (SOFTGOODS)
IMMOBILIZER KNEE 20 THIGH 36 (SOFTGOODS) IMPLANT
IMMOBILIZER KNEE 22 UNIV (SOFTGOODS) IMPLANT
IMMOBILIZER KNEE 24 THIGH 36 (MISCELLANEOUS) IMPLANT
IMMOBILIZER KNEE 24 UNIV (MISCELLANEOUS)
INSERT TRIATH CS X3 11 (Insert) ×3 IMPLANT
KIT BASIN OR (CUSTOM PROCEDURE TRAY) ×3 IMPLANT
KIT TURNOVER KIT B (KITS) ×3 IMPLANT
KNEE PATELLA ASYMMETRIC 10X35 (Knees) ×3 IMPLANT
KNEE TIBIAL COMPONENT SZ5 (Knees) ×3 IMPLANT
MANIFOLD NEPTUNE II (INSTRUMENTS) ×3 IMPLANT
NEEDLE 22X1 1/2 (OR ONLY) (NEEDLE) ×6 IMPLANT
NEEDLE SPNL 18GX3.5 QUINCKE PK (NEEDLE) ×3 IMPLANT
NS IRRIG 1000ML POUR BTL (IV SOLUTION) ×6 IMPLANT
PACK TOTAL JOINT (CUSTOM PROCEDURE TRAY) ×3 IMPLANT
PAD ARMBOARD 7.5X6 YLW CONV (MISCELLANEOUS) ×6 IMPLANT
PAD CAST 4YDX4 CTTN HI CHSV (CAST SUPPLIES) ×1 IMPLANT
PADDING CAST COTTON 4X4 STRL (CAST SUPPLIES) ×2
PADDING CAST COTTON 6X4 STRL (CAST SUPPLIES) ×3 IMPLANT
PIN FLUTED HEDLESS FIX 3.5X1/8 (PIN) ×3 IMPLANT
SET HNDPC FAN SPRY TIP SCT (DISPOSABLE) ×1 IMPLANT
SPONGE LAP 18X18 RF (DISPOSABLE) ×3 IMPLANT
STRIP CLOSURE SKIN 1/2X4 (GAUZE/BANDAGES/DRESSINGS) ×2 IMPLANT
SUCTION FRAZIER HANDLE 10FR (MISCELLANEOUS) ×2
SUCTION TUBE FRAZIER 10FR DISP (MISCELLANEOUS) ×1 IMPLANT
SUT MNCRL AB 3-0 PS2 18 (SUTURE) ×3 IMPLANT
SUT VIC AB 0 CT1 27 (SUTURE) ×10
SUT VIC AB 0 CT1 27XBRD ANBCTR (SUTURE) ×5 IMPLANT
SUT VIC AB 1 CT1 27 (SUTURE) ×12
SUT VIC AB 1 CT1 27XBRD ANBCTR (SUTURE) ×6 IMPLANT
SUT VIC AB 1 CT1 36 (SUTURE) ×3 IMPLANT
SUT VIC AB 2-0 CT1 (SUTURE) ×6 IMPLANT
SUT VIC AB 2-0 CT1 27 (SUTURE) ×8
SUT VIC AB 2-0 CT1 TAPERPNT 27 (SUTURE) ×4 IMPLANT
SYR 30ML LL (SYRINGE) ×9 IMPLANT
SYR TB 1ML LUER SLIP (SYRINGE) ×3 IMPLANT
TOWEL GREEN STERILE (TOWEL DISPOSABLE) ×6 IMPLANT
TOWEL GREEN STERILE FF (TOWEL DISPOSABLE) ×6 IMPLANT
TRAY CATH 16FR W/PLASTIC CATH (SET/KITS/TRAYS/PACK) IMPLANT
WATER STERILE IRR 1000ML POUR (IV SOLUTION) IMPLANT

## 2019-06-22 NOTE — Anesthesia Procedure Notes (Signed)
Anesthesia Regional Block: Adductor canal block   Pre-Anesthetic Checklist: ,, timeout performed, Correct Patient, Correct Site, Correct Laterality, Correct Procedure, Correct Position, site marked, Risks and benefits discussed,  Surgical consent,  Pre-op evaluation,  At surgeon's request and post-op pain management  Laterality: Left  Prep: chloraprep       Needles:  Injection technique: Single-shot  Needle Type: Echogenic Needle     Needle Length: 9cm  Needle Gauge: 21     Additional Needles:   Narrative:  Start time: 06/22/2019 11:10 AM End time: 06/22/2019 11:18 AM Injection made incrementally with aspirations every 5 mL.  Performed by: Personally  Anesthesiologist: Achille Rich, MD  Additional Notes: Pt tolerated the procedure well.

## 2019-06-22 NOTE — Transfer of Care (Signed)
Immediate Anesthesia Transfer of Care Note  Patient: Mark Farley.  Procedure(s) Performed: LEFT TOTAL KNEE ARTHROPLASTY (Left Knee)  Patient Location: PACU  Anesthesia Type:Spinal  Level of Consciousness: awake, alert  and oriented  Airway & Oxygen Therapy: Patient Spontanous Breathing and Patient connected to face mask oxygen  Post-op Assessment: Report given to RN and Post -op Vital signs reviewed and stable  Post vital signs: Reviewed and stable  Last Vitals:  Vitals Value Taken Time  BP 91/56 06/22/19 1500  Temp 36.5 C 06/22/19 1500  Pulse 70 06/22/19 1512  Resp 12 06/22/19 1512  SpO2 100 % 06/22/19 1512  Vitals shown include unvalidated device data.  Last Pain:  Vitals:   06/22/19 1500  TempSrc:   PainSc: Asleep         Complications: No apparent anesthesia complications

## 2019-06-22 NOTE — Op Note (Signed)
NAME: Hamp, Moreland MEDICAL RECORD ZH:[id number] ACCOUNT [id number] DATE OF BIRTH:[birth year] FACILITY: [facility] LOCATION: [facility] PHYSICIAN:Tiarna Koppen Diamantina Providence, MD  OPERATIVE REPORT  DATE OF PROCEDURE:  06/22/2019  PREOPERATIVE DIAGNOSIS:  Left knee arthritis.  POSTOPERATIVE DIAGNOSIS:  Left knee arthritis.  PROCEDURE:  Left total knee replacement using Stryker Triathlon press-fit size 4 femur, 5 tibia, 11 mm polyethylene deep dish insert, posterior cruciate retaining design and 35 mm 3-peg press-fit patella.  SURGEON:  Cammy Copa, MD  ASSISTANT:  Karenann Cai, PA.  INDICATIONS:  This is a patient with left knee arthritis with failure of conservative management, who presents for operative management after explanation of risks and benefits.  PROCEDURE IN DETAIL:  The patient was brought to the operating room where spinal anesthetic was induced.  Preoperative antibiotics were administered.  Timeout was called.  The left knee was pre-scrubbed with alcohol and Betadine and allowed to air dry,  prepped with DuraPrep solution and draped in a sterile manner.  Ioban used to cover the operative field.  Leg was elevated and exsanguinated with the Esmarch wrap.  Tourniquet was inflated to 325 mmHg.  Timeout was called.  Anterior approach to knee was  made.  Skin and subcutaneous tissue were sharply divided.  Median parapatellar arthrotomy was made and marked with #1 Vicryl suture.  At this time, patella was everted, fat pad partially excised.  Lateral patellofemoral ligament was released.  Soft  tissue removed from the anterior distal femur.  Intramedullary alignment was then used to make a cut on the proximal tibia with  collaterals and posterior neurovascular structures protected.  This was made as a 9 mm cut off the least affected lateral  tibial plateau.  Severe arthritis was present in the patellofemoral joint, followed by medial femoral condyle, followed by the lateral joint.  At this  time,  the cut was made.  PCL was protected.  Intramedullary alignment was then used to make an 8 mm  cut off the distal femur.  Nine and 11 mm spacer blocks were placed and the 11 mm block gave good stability in extension.  Femur was then sized to a size 5.  This was after cutting the distal femur in 5 degrees valgus.  The anterior, posterior and  chamfer cuts were made.  At this time, the trial components were placed on the femur and tibia.  The patient had a very good stability to varus and valgus stress at 0, 30 and 90 degrees with the 11 mm spacer in place.  Patella was then prepared, cutting  down from 24 to 14.  Trial patella placed.  The tibia was keel punched in the appropriate rotation.  At this time, the patient had an excellent extension and flexion.  A small lateral release was performed in order to improve patellar tracking, but it  did track well with no thumbs technique.  Trial components were removed.  A thorough irrigation was performed.  Bone quality was excellent.  A solution of Exparel and Marcaine and saline injected into the capsule.  Tranexamic acid sponge allowed to sit  for 3 minutes within the incision.  At this time, thorough irrigation was performed with 3 L of irrigating solution prior to placing those 2 components into the knee joint.  After 3 minutes, the sponges with the TXA and IrriSept solution, which was  utilized at all times during the case to diminish infection risk were pulled.  Components were then tapped into position with excellent press-fit obtained, same  stability parameters were maintained.  Thorough irrigation again performed with pouring  irrigation.  Tourniquet was released.  Bleeding points encountered were controlled using electrocautery.  The median parapatellar approach was closed over a bolster using #1 Vicryl suture for the arthrotomy, followed by interrupted inverted 0 Vicryl  suture, 2-0 Vicryl suture and a 3-0 Monocryl with Steri-Strips and Aquacel  dressing applied.  Steri-Strips and an Ace wrap applied.  The patient tolerated the procedure well without immediate complications.  Luke's assistance was required for opening and  closing, limb positioning and retraction.  His assistant was a medical necessity.  It should be noted that prior to final closure, a solution of Marcaine, morphine, clonidine was injected into the knee for added postop pain relief.  The patient was  transferred to the recovery room in stable condition.  Luke's assistance was a medical necessity.  VN/NUANCE  D:06/22/2019 T:06/22/2019 JOB:[id number]

## 2019-06-22 NOTE — H&P (Signed)
TOTAL KNEE ADMISSION H&P  Patient is being admitted for left total knee arthroplasty.  Subjective:  Chief Complaint:left knee pain.  HPI: Mark Farley., 74 y.o. male, has a history of pain and functional disability in the left knee due to arthritis and has failed non-surgical conservative treatments for greater than 12 weeks to includeNSAID's and/or analgesics, corticosteriod injections, viscosupplementation injections, flexibility and strengthening excercises and activity modification.  Onset of symptoms was gradual, starting 7 years ago with gradually worsening course since that time. The patient noted prior procedures on the knee to include  arthroscopy and menisectomy on the left knee(s).  Patient currently rates pain in the left knee(s) at 8 out of 10 with activity. Patient has night pain, worsening of pain with activity and weight bearing, pain that interferes with activities of daily living, pain with passive range of motion and joint swelling.  Patient has evidence of subchondral sclerosis and joint space narrowing by imaging studies. This patient has had A good result with his right total knee replacement.  Had definite arthritic changes in the medial compartment at the time of his arthroscopy over a year ago on the left-hand side.. There is no active infection.  Patient Active Problem List   Diagnosis Date Noted  . S/P total knee arthroplasty, right 03/14/2019  . Educated about COVID-19 virus infection 09/25/2018  . Erectile dysfunction 09/25/2018  . Intention tremor 03/09/2017  . Tingling of left upper extremity 03/09/2017  . Runner's knee, right 03/09/2017  . Hepatic steatosis 03/09/2017  . Hearing loss due to cerumen impaction 03/08/2017  . Encounter for preventive health examination 05/26/2015  . SVT (supraventricular tachycardia) (HCC) 02/07/2015  . History of syncope 03/06/2014  . PVC's (premature ventricular contractions) 03/06/2014  . Osteoarthritis 02/08/2012  .  Prediabetes 08/06/2011  . Overweight (BMI 25.0-29.9) 02/06/2011  . Family history of colon cancer requiring screening colonoscopy 02/05/2011  . Mixed hyperlipidemia 08/14/2008  . HYPERTENSION, BENIGN 08/14/2008   Past Medical History:  Diagnosis Date  . Arthritis    ? Gout  . Bilateral carpal tunnel syndrome    managed with nocturnal braces x 3 years  . COPD (chronic obstructive pulmonary disease) (HCC)   . Dysrhythmia    Tachycardia, PVCs, SVTs  . GERD (gastroesophageal reflux disease)   . Glucose intolerance (impaired glucose tolerance)   . Heart murmur    as a child  . History of syncope 2005   Unexplained, myoview negative  . Hyperlipidemia   . Hypertension   . Iron deficiency anemia   . Osteoarthritis of multiple joints   . Palpitations 2010   Life Watch with single PVC  . Pre-diabetes     Past Surgical History:  Procedure Laterality Date  . APPENDECTOMY     1957  . COLONOSCOPY    . JOINT REPLACEMENT    . TONSILLECTOMY     1952  . TOTAL KNEE ARTHROPLASTY Right 03/14/2019   Procedure: RIGHT TOTAL KNEE ARTHROPLASTY;  Surgeon: Cammy Copa, MD;  Location: [facility] OR;  Service: Orthopedics;  Laterality: Right;    Current Facility-Administered Medications  Medication Dose Route Frequency Provider Last Rate Last Admin  . tranexamic acid (CYKLOKAPRON) 2,000 mg in sodium chloride 0.9 % 50 mL Topical Application  2,000 mg Topical To OR August Saucer Corrie Mckusick, MD       Current Outpatient Medications  Medication Sig Dispense Refill Last Dose  . aspirin 81 MG chewable tablet Chew 1 tablet (81 mg total) by mouth daily. 30 tablet 0   .  celecoxib (CELEBREX) 100 MG capsule Take 100 mg by mouth daily.     . clotrimazole (LOTRIMIN) 1 % cream Apply 1 application topically 2 (two) times daily as needed (skin irritation.).      Marland Kitchen diltiazem (CARDIZEM) 30 MG tablet Take 1 tablet (30 mg total) by mouth 4 (four) times daily as needed. (Patient taking differently: Take 30 mg by mouth 4  (four) times daily as needed (palpitations.). ) 90 tablet 3   . HYDROcodone-acetaminophen (NORCO/VICODIN) 5-325 MG tablet Take 1 tablet by mouth every 6 (six) hours as needed for moderate pain. 35 tablet 0   . hydrocortisone cream 0.5 % Apply 1 application topically 2 (two) times daily as needed for itching (skin irritation/rash.).     Marland Kitchen metoprolol succinate (TOPROL-XL) 50 MG 24 hr tablet Take 1 tablet (50 mg total) by mouth daily. (Patient taking differently: Take 50 mg by mouth every evening. ) 90 tablet 3   . Omega-3 Fatty Acids (FISH OIL) 1000 MG CAPS Take 1,000 mg by mouth daily.     Marland Kitchen omeprazole (PRILOSEC) 20 MG capsule Take 1 capsule (20 mg total) by mouth daily. 90 capsule 3   . rosuvastatin (CRESTOR) 20 MG tablet Take 10 mg by mouth at bedtime.     . sildenafil (REVATIO) 20 MG tablet Take 20-60 mg by mouth daily as needed (erectile dysfunction.).      Marland Kitchen telmisartan-hydrochlorothiazide (MICARDIS HCT) 80-25 MG tablet Take 1 tablet by mouth daily. 90 tablet 1   . tiZANidine (ZANAFLEX) 4 MG tablet Take 1 tablet (4 mg total) by mouth every 8 (eight) hours as needed for muscle spasms. 30 tablet 0   . traMADol (ULTRAM) 50 MG tablet Take 50 mg by mouth 2 (two) times daily.      . traZODone (DESYREL) 50 MG tablet Take 0.5-1 tablets (25-50 mg total) by mouth at bedtime as needed for sleep. (Patient taking differently: Take 50 mg by mouth at bedtime as needed for sleep. ) 30 tablet 3   . valACYclovir (VALTREX) 1000 MG tablet Take 1-2 g by mouth daily as needed (cold sores/fever blisters).      Marland Kitchen amoxicillin (AMOXIL) 500 MG tablet Take 4 tablets by mouth (2,000 mg) 30-60 minutes prior to dental procedure. (Patient taking differently: Take 2,000 mg by mouth See admin instructions. Take 4 tablets by mouth (2,000 mg) 30-60 minutes prior to dental procedure.) 10 tablet 0    Allergies  Allergen Reactions  . Bee Venom Swelling    Social History   Tobacco Use  . Smoking status: Never Smoker  . Smokeless  tobacco: Never Used  Substance Use Topics  . Alcohol use: Yes    Alcohol/week: 14.0 standard drinks    Types: 14 Standard drinks or equivalent per week    Comment: 2 glasses of wine a day    Family History  Problem Relation Age of Onset  . Coronary artery disease Mother   . Heart attack Mother        MI  . Heart failure Father        CHF  . Colon cancer Brother   . Colon cancer Paternal Grandmother      Review of Systems  Musculoskeletal: Positive for arthralgias.  All other systems reviewed and are negative.   Objective:  Physical Exam  Constitutional: He appears well-developed.  HENT:  Head: Normocephalic.  Eyes: Pupils are equal, round, and reactive to light.  Cardiovascular: Normal rate.  Respiratory: Effort normal.  Musculoskeletal:  Cervical back: Normal range of motion.  Neurological: He is alert.  Skin: Skin is warm.  Psychiatric: He has a normal mood and affect.  Examination of the left knee demonstrates well-healed arthroscopy incisions.  Trace effusion is present.  5 degree flexion contracture is present.  Patient has flexion to about 110.  Collaterals are stable.  Pedal pulses palpable.  Skin is intact in that left knee region  Vital signs in last 24 hours:    Labs:   Estimated body mass index is 30.02 kg/m as calculated from the following:   Height as of 06/19/19: 5' 6.5" (1.689 m).   Weight as of 06/19/19: 85.6 kg.   Imaging Review Plain radiographs demonstrate moderate degenerative joint disease of the left knee(s). The overall alignment ismild varus. The bone quality appears to be good for age and reported activity level.      Assessment/Plan:  End stage arthritis, left knee   The patient history, physical examination, clinical judgment of the provider and imaging studies are consistent with end stage degenerative joint disease of the left knee(s) and total knee arthroplasty is deemed medically necessary. The treatment options including  medical management, injection therapy arthroscopy and arthroplasty were discussed at length. The risks and benefits of total knee arthroplasty were presented and reviewed. The risks due to aseptic loosening, infection, stiffness, patella tracking problems, thromboembolic complications and other imponderables were discussed. The patient acknowledged the explanation, agreed to proceed with the plan and consent was signed. Patient is being admitted for inpatient treatment for surgery, pain control, PT, OT, prophylactic antibiotics, VTE prophylaxis, progressive ambulation and ADL's and discharge planning. The patient is planning to be discharged home with home health services     Patient's anticipated LOS is less than 2 midnights, meeting these requirements: - Younger than 32 - Lives within 1 hour of care - Has a competent adult at home to recover with post-op recover - NO history of  - Chronic pain requiring opiods  - Diabetes  - Coronary Artery Disease  - Heart failure  - Heart attack  - Stroke  - DVT/VTE  - Cardiac arrhythmia  - Respiratory Failure/COPD  - Renal failure  - Anemia  - Advanced Liver disease

## 2019-06-22 NOTE — Anesthesia Postprocedure Evaluation (Signed)
Anesthesia Post Note  Patient: Steele Stracener.  Procedure(s) Performed: LEFT TOTAL KNEE ARTHROPLASTY (Left Knee)     Patient location during evaluation: PACU Anesthesia Type: MAC and Spinal Level of consciousness: oriented and awake and alert Pain management: pain level controlled Vital Signs Assessment: post-procedure vital signs reviewed and stable Respiratory status: spontaneous breathing, respiratory function stable and patient connected to nasal cannula oxygen Cardiovascular status: blood pressure returned to baseline and stable Postop Assessment: no headache, no backache and no apparent nausea or vomiting Anesthetic complications: no    Last Vitals:  Vitals:   06/22/19 1615 06/22/19 1630  BP: 111/70 111/72  Pulse: 64 62  Resp: 13 16  Temp:  36.5 C  SpO2: 99% 99%    Last Pain:  Vitals:   06/22/19 1530  TempSrc:   PainSc: 0-No pain                 Pheobe Sandiford S

## 2019-06-22 NOTE — Anesthesia Procedure Notes (Signed)
Spinal  Patient location during procedure: OR Start time: 06/22/2019 12:07 PM End time: 06/22/2019 12:11 PM Staffing Performed: anesthesiologist  Anesthesiologist: Achille Rich, MD Preanesthetic Checklist Completed: patient identified, IV checked, risks and benefits discussed, surgical consent, monitors and equipment checked, pre-op evaluation and timeout performed Spinal Block Patient position: sitting Prep: DuraPrep Patient monitoring: cardiac monitor, continuous pulse ox and blood pressure Approach: midline Location: L3-4 Injection technique: single-shot Needle Needle type: Pencan  Needle gauge: 24 G Needle length: 9 cm Assessment Sensory level: T10 Additional Notes Functioning IV was confirmed and monitors were applied. Sterile prep and drape, including hand hygiene and sterile gloves were used. The patient was positioned and the spine was prepped. The skin was anesthetized with lidocaine.  Free flow of clear CSF was obtained prior to injecting local anesthetic into the CSF.  The spinal needle aspirated freely following injection.  The needle was carefully withdrawn.  The patient tolerated the procedure well.

## 2019-06-22 NOTE — Brief Op Note (Signed)
   06/22/2019  2:48 PM  PATIENT:  Benay Pillow.  74 y.o. male  PRE-OPERATIVE DIAGNOSIS:  left knee osteoarthritis  POST-OPERATIVE DIAGNOSIS:  left knee osteoarthritis  PROCEDURE:  Procedure(s): LEFT TOTAL KNEE ARTHROPLASTY  SURGEON:  Surgeon(s): Cammy Copa, MD  ASSISTANT: magnant pa  ANESTHESIA:   spinal  EBL: 100 ml    No intake/output data recorded.  BLOOD ADMINISTERED: none  DRAINS: none   LOCAL MEDICATIONS USED: marcaine mso4 clonidine exparel  SPECIMEN:  No Specimen  COUNTS:  YES  TOURNIQUET:  * Missing tourniquet times found for documented tourniquets in log: [id number] * Total Tourniquet Time Documented: Thigh (Left) - 62 minutes Total: Thigh (Left) - 62 minutes   DICTATION: .Other Dictation: Dictation Number [id number]  PLAN OF CARE: Admit for overnight observation  PATIENT DISPOSITION:  PACU - hemodynamically stable

## 2019-06-23 ENCOUNTER — Encounter: Payer: Self-pay | Admitting: *Deleted

## 2019-06-23 MED ORDER — ALUM & MAG HYDROXIDE-SIMETH 200-200-20 MG/5ML PO SUSP
30.0000 mL | ORAL | Status: DC | PRN
  Administered 2019-06-23 (×3): 30 mL via ORAL
  Filled 2019-06-23 (×3): qty 30

## 2019-06-23 NOTE — Evaluation (Signed)
Physical Therapy Evaluation Patient Details Name: Mark Farley. MRN: [id number] DOB: [birth year] Today's Date: 06/23/2019   History of Present Illness  Pt is a 74 y/o male s/p L TKR. PMH including but not limited to COPD, HTN and R TKA on 03/14/19.  Clinical Impression  Pt presented supine in bed with HOB elevated, awake and willing to participate in therapy session. Prior to admission, pt reported that he was independent with all functional mobility and ADLs. Pt lives with his wife in a two level home with a few steps to enter. At the time of evaluation, pt overall moving well at min guard level for ambulation into hallway with RW. Plan for stair training at next session as pt will likely d/c home today or tomorrow. Pt would continue to benefit from skilled physical therapy services at this time while admitted and after d/c to address the below listed limitations in order to improve overall safety and independence with functional mobility.     Follow Up Recommendations Home health PT    Equipment Recommendations  None recommended by PT    Recommendations for Other Services       Precautions / Restrictions Precautions Precautions: Fall;Knee Restrictions Weight Bearing Restrictions: Yes LLE Weight Bearing: Weight bearing as tolerated      Mobility  Bed Mobility Overal bed mobility: Needs Assistance Bed Mobility: Supine to Sit     Supine to sit: Supervision     General bed mobility comments: for safety  Transfers Overall transfer level: Needs assistance Equipment used: Rolling walker (2 wheeled) Transfers: Sit to/from Stand Sit to Stand: Min guard         General transfer comment: good technique utilized, min guard for safety  Ambulation/Gait Ambulation/Gait assistance: Land (Feet): 75 Feet Assistive device: Rolling walker (2 wheeled) Gait Pattern/deviations: Step-to pattern;Decreased step length - right;Decreased step length -  left;Decreased stride length;Decreased weight shift to left Gait velocity: decreased   General Gait Details: pt with mild instability but no overt LOB or need for physical assistance, min guard for safety; pt with a step-to pattern and difficulty with knee flexion on L during swing phase of gait  Stairs            Wheelchair Mobility    Modified Rankin (Stroke Patients Only)       Balance Overall balance assessment: Needs assistance Sitting-balance support: Feet supported Sitting balance-Leahy Scale: Good     Standing balance support: Bilateral upper extremity supported Standing balance-Leahy Scale: Poor                               Pertinent Vitals/Pain Pain Assessment: 0-10 Pain Score: 2  Pain Location: L knee Pain Descriptors / Indicators: Sore Pain Intervention(s): Monitored during session;Patient requesting pain meds-RN notified    Home Living Family/patient expects to be discharged to:: Private residence Living Arrangements: Spouse/significant other Available Help at Discharge: Family;Available 24 hours/day Type of Home: House Home Access: Stairs to enter Entrance Stairs-Rails: None Entrance Stairs-Number of Steps: 4 Home Layout: Able to live on main level with bedroom/bathroom;1/2 bath on main level Home Equipment: Emergency planning/management officer - 2 wheels      Prior Function Level of Independence: Independent         Comments: just finished OP PT for R knee     Hand Dominance   Dominant Hand: Right    Extremity/Trunk Assessment   Upper Extremity Assessment Upper Extremity  Assessment: Overall WFL for tasks assessed    Lower Extremity Assessment Lower Extremity Assessment: LLE deficits/detail LLE Deficits / Details: pt with decreased strength and ROM limitations secondary to post-op pain and weakness.    Cervical / Trunk Assessment Cervical / Trunk Assessment: Normal  Communication   Communication: No difficulties  Cognition  Arousal/Alertness: Awake/alert Behavior During Therapy: WFL for tasks assessed/performed Overall Cognitive Status: Within Functional Limits for tasks assessed                                        General Comments      Exercises     Assessment/Plan    PT Assessment Patient needs continued PT services  PT Problem List Decreased activity tolerance;Decreased strength;Decreased range of motion;Decreased balance;Decreased mobility;Decreased coordination;Decreased knowledge of use of DME;Decreased safety awareness;Decreased knowledge of precautions;Pain       PT Treatment Interventions DME instruction;Gait training;Stair training;Therapeutic activities;Functional mobility training;Therapeutic exercise;Balance training;Neuromuscular re-education;Patient/family education    PT Goals (Current goals can be found in the Care Plan section)  Acute Rehab PT Goals Patient Stated Goal: decrease pain; go on a cruise PT Goal Formulation: With patient Time For Goal Achievement: 07/07/19 Potential to Achieve Goals: Good    Frequency 7X/week   Barriers to discharge        Co-evaluation               AM-PAC PT "6 Clicks" Mobility  Outcome Measure Help needed turning from your back to your side while in a flat bed without using bedrails?: None Help needed moving from lying on your back to sitting on the side of a flat bed without using bedrails?: A Little Help needed moving to and from a bed to a chair (including a wheelchair)?: A Little Help needed standing up from a chair using your arms (e.g., wheelchair or bedside chair)?: A Little Help needed to walk in hospital room?: A Little Help needed climbing 3-5 steps with a railing? : A Little 6 Click Score: 19    End of Session Equipment Utilized During Treatment: Gait belt Activity Tolerance: Patient tolerated treatment well Patient left: in chair;with call bell/phone within reach Nurse Communication: Mobility  status PT Visit Diagnosis: Other abnormalities of gait and mobility (R26.89);Pain Pain - Right/Left: Left Pain - part of body: Knee    Time: 3729-0211 PT Time Calculation (min) (ACUTE ONLY): 25 min   Charges:   PT Evaluation $PT Eval Moderate Complexity: 1 Mod PT Treatments $Gait Training: 8-22 mins        Anastasio Champion, DPT  Acute Rehabilitation Services Pager [phone] Office [place] 06/23/2019, 12:44 PM

## 2019-06-23 NOTE — Progress Notes (Signed)
  Subjective: Patient stable.  Having moderate pain.   Objective: Vital signs in last 24 hours: Temp:  [97.7 F (36.5 C)-98.4 F (36.9 C)] 98.4 F (36.9 C) (01/22 0242) Pulse Rate:  [62-72] 70 (01/22 0242) Resp:  [9-20] 18 (01/22 0100) BP: (91-171)/(56-100) 116/72 (01/22 0242) SpO2:  [94 %-100 %] 96 % (01/22 0242) Weight:  [82.6 kg] 82.6 kg (01/21 0958)  Intake/Output from previous day: 01/21 0701 - 01/22 0700 In: 1119.8 [P.O.:600; I.V.:519.8] Out: 2450 [Urine:2400; Blood:50] Intake/Output this shift: No intake/output data recorded.  Exam:  Dorsiflexion/Plantar flexion intact  Labs: No results for input(s): HGB in the last 72 hours. No results for input(s): WBC, RBC, HCT, PLT in the last 72 hours. No results for input(s): NA, K, CL, CO2, BUN, CREATININE, GLUCOSE, CALCIUM in the last 72 hours. No results for input(s): LABPT, INR in the last 72 hours.  Assessment/Plan: Plan at this time is mobilize with physical therapy.  Possible discharge after 2 visits of physical therapy today.  Otherwise he may need to stay until tomorrow in order to get enough confidence to go home.  We will see how he does and reassess at noon.   Marrianne Mood Zakye Baby 06/23/2019, 8:29 AM

## 2019-06-23 NOTE — Progress Notes (Signed)
  Subjective: Mark Farley. is a 74 y.o. male s/p left TKA.  They are POD1.  Pt's pain is controlled at rest but worse with ambulation.  Pt denies numbness/tingling/weakness.  Pt has ambulated with some difficulty in PT.  He was not confident enough to do stair training yet.  One more session planned for this afternoon. He requests discharge home tomorrow.     Objective: Vital signs in last 24 hours: Temp:  [97.7 F (36.5 C)-98.4 F (36.9 C)] 98.4 F (36.9 C) (01/22 0242) Pulse Rate:  [62-70] 70 (01/22 0242) Resp:  [10-18] 18 (01/22 0100) BP: (91-122)/(56-82) 116/72 (01/22 0242) SpO2:  [94 %-100 %] 96 % (01/22 0242)  Intake/Output from previous day: 01/21 0701 - 01/22 0700 In: 1119.8 [P.O.:600; I.V.:519.8] Out: 2450 [Urine:2400; Blood:50] Intake/Output this shift: Total I/O In: -  Out: 300 [Urine:300]  Exam:  No gross blood or drainage overlying the dressing 2+ DP pulse Sensation intact distally in the left foot Able to dorsiflex and plantarflex the left foot   Labs: No results for input(s): HGB in the last 72 hours. No results for input(s): WBC, RBC, HCT, PLT in the last 72 hours. No results for input(s): NA, K, CL, CO2, BUN, CREATININE, GLUCOSE, CALCIUM in the last 72 hours. No results for input(s): LABPT, INR in the last 72 hours.  Assessment/Plan: Pt is POD1 s/p left TKA.    -Plan to discharge to home tomorrow pending patient's pain and PT eval  -WBAT with a walker  -F/u with Dr. August Saucer in 2 weeks.  -Discharge with Hydrocodone, Robaxin, Aspirin, Celebrex as postop pain control/DVT prophylaxis     Joycie Peek Oluwaferanmi Wain 06/23/2019, 12:30 PM

## 2019-06-23 NOTE — Plan of Care (Signed)
  Problem: Education: Goal: Knowledge of General Education information will improve Description: Including pain rating scale, medication(s)/side effects and non-pharmacologic comfort measures Outcome: Adequate for Discharge   

## 2019-06-23 NOTE — Progress Notes (Signed)
Physical Therapy Treatment Patient Details Name: Mark Farley. MRN: [id number] DOB: 12/10/1945 Today's Date: 06/23/2019    History of Present Illness Pt is a 74 y/o male s/p L TKR. PMH including but not limited to COPD, HTN and R TKA on 03/14/19.    PT Comments    Pt making slow progress with mobility. He was very limited this session secondary to increasing pain despite being premedicated for session. Plan is for pt to d/c home tomorrow with wife. He will need to participate in stair training prior to d/c as he has four steps to enter with no rails. Pt would continue to benefit from skilled physical therapy services at this time while admitted and after d/c to address the below listed limitations in order to improve overall safety and independence with functional mobility.   Follow Up Recommendations  Home health PT     Equipment Recommendations  None recommended by PT    Recommendations for Other Services       Precautions / Restrictions Precautions Precautions: Fall;Knee Restrictions Weight Bearing Restrictions: Yes LLE Weight Bearing: Weight bearing as tolerated    Mobility  Bed Mobility Overal bed mobility: Needs Assistance Bed Mobility: Supine to Sit     Supine to sit: Min assist     General bed mobility comments: for movement of L LE off of bed  Transfers Overall transfer level: Needs assistance Equipment used: Rolling walker (2 wheeled) Transfers: Sit to/from Stand Sit to Stand: Min guard         General transfer comment: good technique utilized, min guard for safety  Ambulation/Gait Ambulation/Gait assistance: Counsellor (Feet): 100 Feet Assistive device: Rolling walker (2 wheeled) Gait Pattern/deviations: Step-to pattern;Decreased step length - right;Decreased step length - left;Decreased stride length;Decreased weight shift to left Gait velocity: decreased   General Gait Details: pt with mild instability but no overt LOB or  need for physical assistance, min guard for safety; pt with a step-to pattern and difficulty with knee flexion on L during swing phase of gait   Stairs             Wheelchair Mobility    Modified Rankin (Stroke Patients Only)       Balance Overall balance assessment: Needs assistance Sitting-balance support: Feet supported Sitting balance-Leahy Scale: Good     Standing balance support: Bilateral upper extremity supported Standing balance-Leahy Scale: Poor                              Cognition Arousal/Alertness: Awake/alert Behavior During Therapy: WFL for tasks assessed/performed Overall Cognitive Status: Within Functional Limits for tasks assessed                                        Exercises Total Joint Exercises Ankle Circles/Pumps: AROM;Both;20 reps;Seated Quad Sets: AROM;Strengthening;Left;10 reps;Seated General Exercises - Lower Extremity Gluteal Sets: AROM;Strengthening;Both;10 reps;Seated    General Comments        Pertinent Vitals/Pain Pain Assessment: 0-10 Pain Score: 7  Pain Location: L knee Pain Descriptors / Indicators: Sore Pain Intervention(s): Monitored during session;Repositioned;Premedicated before session;Patient requesting pain meds-RN notified    Home Living                      Prior Function  PT Goals (current goals can now be found in the care plan section) Acute Rehab PT Goals PT Goal Formulation: With patient Time For Goal Achievement: 07/07/19 Potential to Achieve Goals: Good Progress towards PT goals: Progressing toward goals    Frequency    7X/week      PT Plan Current plan remains appropriate    Co-evaluation              AM-PAC PT "6 Clicks" Mobility   Outcome Measure  Help needed turning from your back to your side while in a flat bed without using bedrails?: None Help needed moving from lying on your back to sitting on the side of a flat bed  without using bedrails?: A Little Help needed moving to and from a bed to a chair (including a wheelchair)?: A Little Help needed standing up from a chair using your arms (e.g., wheelchair or bedside chair)?: A Little Help needed to walk in hospital room?: A Little Help needed climbing 3-5 steps with a railing? : A Lot 6 Click Score: 18    End of Session Equipment Utilized During Treatment: Gait belt Activity Tolerance: Patient limited by pain Patient left: in chair;with call bell/phone within reach;with family/visitor present Nurse Communication: Mobility status;Patient requests pain meds PT Visit Diagnosis: Other abnormalities of gait and mobility (R26.89);Pain Pain - Right/Left: Left Pain - part of body: Knee     Time: 7793-9030 PT Time Calculation (min) (ACUTE ONLY): 29 min  Charges:  $Gait Training: 8-22 mins $Therapeutic Exercise: 8-22 mins                     Arletta Bale, DPT  Acute Rehabilitation Services Pager [phone] Office [phone]     Alessandra Bevels Marlyn Rabine 06/23/2019, 5:30 PM

## 2019-06-24 MED ORDER — HYDROCODONE-ACETAMINOPHEN 5-325 MG PO TABS: 1.0000 | ORAL_TABLET | ORAL | 0 refills | Status: DC | PRN

## 2019-06-24 MED ORDER — ASPIRIN 81 MG PO CHEW: 81.0000 mg | CHEWABLE_TABLET | Freq: Two times a day (BID) | ORAL | 0 refills | Status: DC

## 2019-06-24 MED ORDER — METHOCARBAMOL 500 MG PO TABS: 500.0000 mg | ORAL_TABLET | Freq: Three times a day (TID) | ORAL | 0 refills | Status: DC | PRN

## 2019-06-24 NOTE — Progress Notes (Signed)
Physical Therapy Treatment Patient Details Name: Mark Farley. MRN: [id number] DOB: Oct 30, 1945 Today's Date: 06/24/2019    History of Present Illness Pt is a 74 y/o male s/p L TKR. PMH including but not limited to COPD, HTN and R TKA on 03/14/19.    PT Comments    Wife present for session to participate in stair training. Patient ambulating in hallway and up/down 2 steps x 2 with use of RW with min guard assist for safety and verbal cueing for sequencing. Patient and wife able to complete stair navigation without LOB or need for physical assist for steadying. Handout also given on sequencing of stair navigation for full carryover. No further questions asked from patient or wife with subjective reports of feeling comfortable with this. All other mobility performance at min guard level other than exiting/entering bed with min A for LE guidance. From a mobility standpoint patient appropriately for discharge. Will continue to follow acutely.     Follow Up Recommendations  Home health PT     Equipment Recommendations  None recommended by PT    Recommendations for Other Services       Precautions / Restrictions Precautions Precautions: Fall;Knee Restrictions Weight Bearing Restrictions: Yes LLE Weight Bearing: Weight bearing as tolerated    Mobility  Bed Mobility Overal bed mobility: Needs Assistance Bed Mobility: Supine to Sit     Supine to sit: Min assist     General bed mobility comments: for movement of L LE off of bed  Transfers Overall transfer level: Needs assistance Equipment used: Rolling walker (2 wheeled) Transfers: Sit to/from Stand Sit to Stand: Min guard         General transfer comment: for safety  Ambulation/Gait Ambulation/Gait assistance: Min guard Gait Distance (Feet): 200 Feet Assistive device: Rolling walker (2 wheeled) Gait Pattern/deviations: Step-to pattern;Decreased step length - right;Decreased step length - left;Decreased stride  length;Decreased weight shift to left;Step-through pattern Gait velocity: decreased   General Gait Details: min guard for safety - no overt LOB   Stairs Stairs: Yes Stairs assistance: Min guard Stair Management: No rails;Step to pattern;Backwards;Forwards;With walker Number of Stairs: 2(x2) General stair comments: wife present for stair training   Wheelchair Mobility    Modified Rankin (Stroke Patients Only)       Balance Overall balance assessment: Needs assistance;Mild deficits observed, not formally tested Sitting-balance support: Feet supported Sitting balance-Leahy Scale: Good     Standing balance support: Bilateral upper extremity supported Standing balance-Leahy Scale: Poor                              Cognition Arousal/Alertness: Awake/alert Behavior During Therapy: WFL for tasks assessed/performed Overall Cognitive Status: Within Functional Limits for tasks assessed                                        Exercises      General Comments        Pertinent Vitals/Pain Pain Assessment: 0-10 Pain Score: 3  Pain Location: L knee Pain Descriptors / Indicators: Aching;Discomfort Pain Intervention(s): Limited activity within patient's tolerance;Monitored during session;Repositioned    Home Living                      Prior Function            PT Goals (current goals can now  be found in the care plan section) Acute Rehab PT Goals Patient Stated Goal: decrease pain; go on a cruise PT Goal Formulation: With patient Time For Goal Achievement: 07/07/19 Potential to Achieve Goals: Good Progress towards PT goals: Progressing toward goals    Frequency    7X/week      PT Plan Current plan remains appropriate    Co-evaluation              AM-PAC PT "6 Clicks" Mobility   Outcome Measure  Help needed turning from your back to your side while in a flat bed without using bedrails?: None Help needed moving  from lying on your back to sitting on the side of a flat bed without using bedrails?: A Little Help needed moving to and from a bed to a chair (including a wheelchair)?: A Little Help needed standing up from a chair using your arms (e.g., wheelchair or bedside chair)?: A Little Help needed to walk in hospital room?: A Little Help needed climbing 3-5 steps with a railing? : A Little 6 Click Score: 19    End of Session Equipment Utilized During Treatment: Gait belt Activity Tolerance: Patient tolerated treatment well Patient left: in bed;with call bell/phone within reach;with family/visitor present Nurse Communication: Mobility status PT Visit Diagnosis: Other abnormalities of gait and mobility (R26.89);Pain Pain - Right/Left: Left Pain - part of body: Knee     Time: 7622-6333 PT Time Calculation (min) (ACUTE ONLY): 25 min  Charges:  $Gait Training: 8-22 mins $Therapeutic Activity: 8-22 mins                     Lanney Gins, PT, DPT Supplemental Physical Therapist 06/24/19 2:23 PM Pager: [phone] Office: [phone]

## 2019-06-24 NOTE — Plan of Care (Signed)
  Problem: Health Behavior/Discharge Planning: Goal: Ability to manage health-related needs will improve Outcome: Progressing   Problem: Clinical Measurements: Goal: Ability to maintain clinical measurements within normal limits will improve Outcome: Progressing Goal: Will remain free from infection Outcome: Progressing   

## 2019-06-24 NOTE — Discharge Instructions (Addendum)
° ° °Keep knee incision dry for 5 days post op then may wet while bathing. °Therapy daily and CPM goal full extension and greater than 90 degrees flexion. °Call if fever or chills or increased drainage. °Go to ER if acutely short of breath or call for ambulance. °Return for follow up in 2 weeks. °May full weight bear on the surgical leg unless told otherwise. °Use knee immobilizer until able to straight leg raise off bed with knee stable. °In house walking for first 2 weeks. °INSTRUCTIONS AFTER JOINT REPLACEMENT  ° °o Remove items at home which could result in a fall. This includes throw rugs or furniture in walking pathways °o ICE to the affected joint every three hours while awake for 30 minutes at a time, for at least the first 3-5 days, and then as needed for pain and swelling.  Continue to use ice for pain and swelling. You may notice swelling that will progress down to the foot and ankle.  This is normal after surgery.  Elevate your leg when you are not up walking on it.   °o Continue to use the breathing machine you got in the hospital (incentive spirometer) which will help keep your temperature down.  It is common for your temperature to cycle up and down following surgery, especially at night when you are not up moving around and exerting yourself.  The breathing machine keeps your lungs expanded and your temperature down. ° ° °DIET:  As you were doing prior to hospitalization, we recommend a well-balanced diet. ° °DRESSING / WOUND CARE / SHOWERING ° °Keep the surgical dressing until follow up.  The dressing is water proof, so you can shower without any extra covering.  IF THE DRESSING FALLS OFF or the wound gets wet inside, change the dressing with sterile gauze.  Please use good hand washing techniques before changing the dressing.  Do not use any lotions or creams on the incision until instructed by your surgeon.   ° °ACTIVITY ° °o Increase activity slowly as tolerated, but follow the weight bearing  instructions below.   °o No driving for 6 weeks or until further direction given by your physician.  You cannot drive while taking narcotics.  °o No lifting or carrying greater than 10 lbs. until further directed by your surgeon. °o Avoid periods of inactivity such as sitting longer than an hour when not asleep. This helps prevent blood clots.  °o You may return to work once you are authorized by your doctor.  ° ° ° °WEIGHT BEARING  ° °Weight bearing as tolerated with assist device (walker, cane, etc) as directed, use it as long as suggested by your surgeon or therapist, typically at least 4-6 weeks. ° ° °EXERCISES ° °Results after joint replacement surgery are often greatly improved when you follow the exercise, range of motion and muscle strengthening exercises prescribed by your doctor. Safety measures are also important to protect the joint from further injury. Any time any of these exercises cause you to have increased pain or swelling, decrease what you are doing until you are comfortable again and then slowly increase them. If you have problems or questions, call your caregiver or physical therapist for advice.  ° °Rehabilitation is important following a joint replacement. After just a few days of immobilization, the muscles of the leg can become weakened and shrink (atrophy).  These exercises are designed to build up the tone and strength of the thigh and leg muscles and to improve motion. Often   times heat used for twenty to thirty minutes before working out will loosen up your tissues and help with improving the range of motion but do not use heat for the first two weeks following surgery (sometimes heat can increase post-operative swelling).  ° °These exercises can be done on a training (exercise) mat, on the floor, on a table or on a bed. Use whatever works the best and is most comfortable for you.    Use music or television while you are exercising so that the exercises are a pleasant break in your day.  This will make your life better with the exercises acting as a break in your routine that you can look forward to.   Perform all exercises about fifteen times, three times per day or as directed.  You should exercise both the operative leg and the other leg as well. ° °Exercises include: °  °• Quad Sets - Tighten up the muscle on the front of the thigh (Quad) and hold for 5-10 seconds.   °• Straight Leg Raises - With your knee straight (if you were given a brace, keep it on), lift the leg to 60 degrees, hold for 3 seconds, and slowly lower the leg.  Perform this exercise against resistance later as your leg gets stronger.  °• Leg Slides: Lying on your back, slowly slide your foot toward your buttocks, bending your knee up off the floor (only go as far as is comfortable). Then slowly slide your foot back down until your leg is flat on the floor again.  °• Angel Wings: Lying on your back spread your legs to the side as far apart as you can without causing discomfort.  °• Hamstring Strength:  Lying on your back, push your heel against the floor with your leg straight by tightening up the muscles of your buttocks.  Repeat, but this time bend your knee to a comfortable angle, and push your heel against the floor.  You may put a pillow under the heel to make it more comfortable if necessary.  ° °A rehabilitation program following joint replacement surgery can speed recovery and prevent re-injury in the future due to weakened muscles. Contact your doctor or a physical therapist for more information on knee rehabilitation.  ° ° °CONSTIPATION ° °Constipation is defined medically as fewer than three stools per week and severe constipation as less than one stool per week.  Even if you have a regular bowel pattern at home, your normal regimen is likely to be disrupted due to multiple reasons following surgery.  Combination of anesthesia, postoperative narcotics, change in appetite and fluid intake all can affect your bowels.   ° °YOU MUST use at least one of the following options; they are listed in order of increasing strength to get the job done.  They are all available over the counter, and you may need to use some, POSSIBLY even all of these options:   ° °Drink plenty of fluids (prune juice may be helpful) and high fiber foods °Colace 100 mg by mouth twice a day  °Senokot for constipation as directed and as needed Dulcolax (bisacodyl), take with full glass of water  °Miralax (polyethylene glycol) once or twice a day as needed. ° °If you have tried all these things and are unable to have a bowel movement in the first 3-4 days after surgery call either your surgeon or your primary doctor.   ° °If you experience loose stools or diarrhea, hold the medications until you stool   forms back up.  If your symptoms do not get better within 1 week or if they get worse, check with your doctor.  If you experience "the worst abdominal pain ever" or develop nausea or vomiting, please contact the office immediately for further recommendations for treatment. ° ° °ITCHING:  If you experience itching with your medications, try taking only a single pain pill, or even half a pain pill at a time.  You can also use Benadryl over the counter for itching or also to help with sleep.  ° °TED HOSE STOCKINGS:  Use stockings on both legs until for at least 2 weeks or as directed by physician office. They may be removed at night for sleeping. ° °MEDICATIONS:  See your medication summary on the “After Visit Summary” that nursing will review with you.  You may have some home medications which will be placed on hold until you complete the course of blood thinner medication.  It is important for you to complete the blood thinner medication as prescribed. ° °PRECAUTIONS:  If you experience chest pain or shortness of breath - call 911 immediately for transfer to the hospital emergency department.  ° °If you develop a fever greater that 101 F, purulent drainage from wound,  increased redness or drainage from wound, foul odor from the wound/dressing, or calf pain - CONTACT YOUR SURGEON.   °                                                °FOLLOW-UP APPOINTMENTS:  If you do not already have a post-op appointment, please call the office for an appointment to be seen by your surgeon.  Guidelines for how soon to be seen are listed in your “After Visit Summary”, but are typically between 1-4 weeks after surgery. ° °OTHER INSTRUCTIONS:  ° °Knee Replacement:  Do not place pillow under knee, focus on keeping the knee straight while resting. CPM instructions: 0-90 degrees, 2 hours in the morning, 2 hours in the afternoon, and 2 hours in the evening. Place foam block, curve side up under heel at all times except when in CPM or when walking.  DO NOT modify, tear, cut, or change the foam block in any way. ° °MAKE SURE YOU:  °• Understand these instructions.  °• Get help right away if you are not doing well or get worse.  ° ° °Thank you for letting us be a part of your medical care team.  It is a privilege we respect greatly.  We hope these instructions will help you stay on track for a fast and full recovery!  ° °

## 2019-06-24 NOTE — TOC Transition Note (Signed)
Transition of Care [facility]) - CM/SW Discharge Note   Patient Details  Name: Mark Farley. MRN: [id number] Date of Birth: 25-Dec-1945  Transition of Care [facility]) CM/SW Contact:  Deveron Furlong, RN 06/24/2019, 3:09 PM   Clinical Narrative:    Pt d/c home.  Kat[facility] at [facility] reports they are following for [facility] needs.  [place] Adas of D/c.    Final next level of care: Home w Home Health Services Barriers to Discharge: No Barriers Identified   Patient Goals and CMS Choice     Choice offered to / list presented to : Patient  Discharge Plan and Services    HH Arranged: PT HH Agency: [facility] Date HH Agency Contacted: 06/23/19 Time HH Agency Contacted: 1706 Representative spoke with at [facility] Agency: Elmarie Shiley

## 2019-06-24 NOTE — Progress Notes (Signed)
Discharge instructions given to patient.  All personal belongings gathered and taken oiut by spouse.  Patient denied further questions.

## 2019-06-24 NOTE — Progress Notes (Signed)
     Subjective: 2 Days Post-Op Procedure(s) (LRB): LEFT TOTAL KNEE ARTHROPLASTY (Left) Awake, alert and oriented x 4. Previous right TKR 3 months ago now with left TKR. Mild swelling as he had with the previous right TKR. Work with PT this AM but still has to be able to negotiate 4-5 stairs at home and PT will address with next session this afternoon.   Patient reports pain as moderate.    Objective:   VITALS:  Temp:  [97.7 F (36.5 C)-98.4 F (36.9 C)] 98.4 F (36.9 C) (01/23 0749) Pulse Rate:  [62-81] 62 (01/23 0749) Resp:  [17-18] 18 (01/23 0332) BP: (129-156)/(62-96) 129/80 (01/23 0749) SpO2:  [98 %-100 %] 98 % (01/23 0749)  Neurologically intact ABD soft Neurovascular intact Sensation intact distally Intact pulses distally Dorsiflexion/Plantar flexion intact Incision: scant drainage   LABS No results for input(s): HGB, WBC, PLT in the last 72 hours. No results for input(s): NA, K, CL, CO2, BUN, CREATININE, GLUCOSE in the last 72 hours. No results for input(s): LABPT, INR in the last 72 hours.   Assessment/Plan: 2 Days Post-Op Procedure(s) (LRB): LEFT TOTAL KNEE ARTHROPLASTY (Left)  Up with therapy D/C IV fluids Discharge home with home health after completes PT and stairs today.   Vira Browns 06/24/2019, 10:06 AM

## 2019-06-25 DIAGNOSIS — G5603 Carpal tunnel syndrome, bilateral upper limbs: Secondary | ICD-10-CM | POA: Diagnosis not present

## 2019-06-25 DIAGNOSIS — K760 Fatty (change of) liver, not elsewhere classified: Secondary | ICD-10-CM | POA: Diagnosis not present

## 2019-06-25 DIAGNOSIS — J449 Chronic obstructive pulmonary disease, unspecified: Secondary | ICD-10-CM | POA: Diagnosis not present

## 2019-06-25 DIAGNOSIS — I471 Supraventricular tachycardia: Secondary | ICD-10-CM | POA: Diagnosis not present

## 2019-06-25 DIAGNOSIS — I493 Ventricular premature depolarization: Secondary | ICD-10-CM | POA: Diagnosis not present

## 2019-06-25 DIAGNOSIS — D649 Anemia, unspecified: Secondary | ICD-10-CM | POA: Diagnosis not present

## 2019-06-25 DIAGNOSIS — Z471 Aftercare following joint replacement surgery: Secondary | ICD-10-CM | POA: Diagnosis not present

## 2019-06-25 DIAGNOSIS — I10 Essential (primary) hypertension: Secondary | ICD-10-CM | POA: Diagnosis not present

## 2019-06-25 DIAGNOSIS — N529 Male erectile dysfunction, unspecified: Secondary | ICD-10-CM | POA: Diagnosis not present

## 2019-06-26 ENCOUNTER — Telehealth: Payer: Self-pay | Admitting: Orthopedic Surgery

## 2019-06-26 NOTE — Telephone Encounter (Signed)
Maria from [facility] called.   She is requesting PT verbal order for the patient   3wk2   Call back number: [phone]

## 2019-06-26 NOTE — Telephone Encounter (Signed)
IC verbal given.  

## 2019-06-27 DIAGNOSIS — J449 Chronic obstructive pulmonary disease, unspecified: Secondary | ICD-10-CM | POA: Diagnosis not present

## 2019-06-27 DIAGNOSIS — I471 Supraventricular tachycardia: Secondary | ICD-10-CM | POA: Diagnosis not present

## 2019-06-27 DIAGNOSIS — Z471 Aftercare following joint replacement surgery: Secondary | ICD-10-CM | POA: Diagnosis not present

## 2019-06-27 DIAGNOSIS — I10 Essential (primary) hypertension: Secondary | ICD-10-CM | POA: Diagnosis not present

## 2019-06-27 DIAGNOSIS — K760 Fatty (change of) liver, not elsewhere classified: Secondary | ICD-10-CM | POA: Diagnosis not present

## 2019-06-27 DIAGNOSIS — G5603 Carpal tunnel syndrome, bilateral upper limbs: Secondary | ICD-10-CM | POA: Diagnosis not present

## 2019-06-27 DIAGNOSIS — D649 Anemia, unspecified: Secondary | ICD-10-CM | POA: Diagnosis not present

## 2019-06-27 DIAGNOSIS — I493 Ventricular premature depolarization: Secondary | ICD-10-CM | POA: Diagnosis not present

## 2019-06-27 DIAGNOSIS — N529 Male erectile dysfunction, unspecified: Secondary | ICD-10-CM | POA: Diagnosis not present

## 2019-06-30 DIAGNOSIS — I471 Supraventricular tachycardia: Secondary | ICD-10-CM | POA: Diagnosis not present

## 2019-06-30 DIAGNOSIS — D649 Anemia, unspecified: Secondary | ICD-10-CM | POA: Diagnosis not present

## 2019-06-30 DIAGNOSIS — I493 Ventricular premature depolarization: Secondary | ICD-10-CM | POA: Diagnosis not present

## 2019-06-30 DIAGNOSIS — Z471 Aftercare following joint replacement surgery: Secondary | ICD-10-CM | POA: Diagnosis not present

## 2019-06-30 DIAGNOSIS — I10 Essential (primary) hypertension: Secondary | ICD-10-CM | POA: Diagnosis not present

## 2019-06-30 DIAGNOSIS — N529 Male erectile dysfunction, unspecified: Secondary | ICD-10-CM | POA: Diagnosis not present

## 2019-06-30 DIAGNOSIS — G5603 Carpal tunnel syndrome, bilateral upper limbs: Secondary | ICD-10-CM | POA: Diagnosis not present

## 2019-06-30 DIAGNOSIS — J449 Chronic obstructive pulmonary disease, unspecified: Secondary | ICD-10-CM | POA: Diagnosis not present

## 2019-06-30 DIAGNOSIS — K760 Fatty (change of) liver, not elsewhere classified: Secondary | ICD-10-CM | POA: Diagnosis not present

## 2019-07-01 NOTE — Discharge Summary (Signed)
Physician Discharge Summary      Patient ID: Mark Farley. MRN: [id number] DOB/AGE: 1945-07-19 74 y.o.  Admit date: 06/22/2019 Discharge date: 06/24/2019  Admission Diagnoses:  Active Problems:   Arthritis of left knee   Discharge Diagnoses:  Same  Surgeries: Procedure(s): LEFT TOTAL KNEE ARTHROPLASTY on 06/22/2019   Consultants:   Discharged Condition: Stable  Hospital Course: Mark Farley. is an 74 y.o. male who was admitted 06/22/2019 with a chief complaint of left knee pain, and found to have a diagnosis of left knee OA.  They were brought to the operating room on 06/22/2019 and underwent the above named procedures.  Pt awoke from anesthesia without complication and was transferred to the floor. On POD1, patient had increased pain with ambulation and had some relative difficulty with mobility in PT.  He was seen again on POD2, where his pain was much improved and he was able to ambulate much more easily. He felt comfortable with discharge home on POD2. He was discharged on POD2.  Pt will f/u with Dr. August Saucer in clinic in ~2 weeks.   Antibiotics given:  Anti-infectives (From admission, onward)   Start     Dose/Rate Route Frequency Ordered Stop   06/22/19 1800  ceFAZolin (ANCEF) IVPB 2g/100 mL premix     2 g 200 mL/hr over 30 Minutes Intravenous Every 6 hours 06/22/19 1700 06/23/19 0100   06/22/19 1000  ceFAZolin (ANCEF) IVPB 2g/100 mL premix     2 g 200 mL/hr over 30 Minutes Intravenous On call to O.R. 06/22/19 6269 06/22/19 1209   06/22/19 0959  ceFAZolin (ANCEF) 2-4 GM/100ML-% IVPB    Note to Pharmacy: Mark Farley   : cabinet override      06/22/19 0959 06/22/19 1221    .  Recent vital signs:  Vitals:   06/24/19 0332 06/24/19 0749  BP: (!) 141/96 129/80  Pulse: 71 62  Resp: 18   Temp: 98.4 F (36.9 C) 98.4 F (36.9 C)  SpO2: 98% 98%    Recent laboratory studies:  Results for orders placed or performed during the hospital encounter of 06/22/19   Glucose, capillary  Result Value Ref Range   Glucose-Capillary 104 (H) 70 - 99 mg/dL    Discharge Medications:   Allergies as of 06/24/2019      Reactions   Bee Venom Swelling      Medication List    STOP taking these medications   traMADol 50 MG tablet Commonly known as: ULTRAM     TAKE these medications   amoxicillin 500 MG tablet Commonly known as: AMOXIL Take 4 tablets by mouth (2,000 mg) 30-60 minutes prior to dental procedure. What changed:   how much to take  how to take this  when to take this   aspirin 81 MG chewable tablet Chew 1 tablet (81 mg total) by mouth 2 (two) times daily. What changed: when to take this   celecoxib 100 MG capsule Commonly known as: CELEBREX Take 100 mg by mouth daily.   clotrimazole 1 % cream Commonly known as: LOTRIMIN Apply 1 application topically 2 (two) times daily as needed (skin irritation.).   diltiazem 30 MG tablet Commonly known as: CARDIZEM Take 1 tablet (30 mg total) by mouth 4 (four) times daily as needed. What changed: reasons to take this   Fish Oil 1000 MG Caps Take 1,000 mg by mouth daily.   HYDROcodone-acetaminophen 5-325 MG tablet Commonly known as: NORCO/VICODIN Take 1-2 tablets by mouth every  4 (four) hours as needed for moderate pain. What changed:   how much to take  when to take this   hydrocortisone cream 0.5 % Apply 1 application topically 2 (two) times daily as needed for itching (skin irritation/rash.).   methocarbamol 500 MG tablet Commonly known as: ROBAXIN Take 1 tablet (500 mg total) by mouth every 8 (eight) hours as needed for muscle spasms.   metoprolol succinate 50 MG 24 hr tablet Commonly known as: TOPROL-XL Take 1 tablet (50 mg total) by mouth daily. What changed: when to take this   omeprazole 20 MG capsule Commonly known as: PRILOSEC Take 1 capsule (20 mg total) by mouth daily.   rosuvastatin 20 MG tablet Commonly known as: CRESTOR Take 10 mg by mouth at bedtime.    sildenafil 20 MG tablet Commonly known as: REVATIO Take 20-60 mg by mouth daily as needed (erectile dysfunction.).   telmisartan-hydrochlorothiazide 80-25 MG tablet Commonly known as: MICARDIS HCT Take 1 tablet by mouth daily.   tiZANidine 4 MG tablet Commonly known as: Zanaflex Take 1 tablet (4 mg total) by mouth every 8 (eight) hours as needed for muscle spasms.   traZODone 50 MG tablet Commonly known as: DESYREL Take 0.5-1 tablets (25-50 mg total) by mouth at bedtime as needed for sleep. What changed: how much to take   valACYclovir 1000 MG tablet Commonly known as: VALTREX Take 1-2 g by mouth daily as needed (cold sores/fever blisters).       Diagnostic Studies: No results found.  Disposition: Discharge disposition: 01-Home or Self Care       Discharge Instructions    Call MD / Call 911   Complete by: As directed    If you experience chest pain or shortness of breath, CALL 911 and be transported to the hospital emergency room.  If you develope a fever above 101 F, pus (white drainage) or increased drainage or redness at the wound, or calf pain, call your surgeon's office.   Constipation Prevention   Complete by: As directed    Drink plenty of fluids.  Prune juice may be helpful.  You may use a stool softener, such as Colace (over the counter) 100 mg twice a day.  Use MiraLax (over the counter) for constipation as needed.   Diet - low sodium heart healthy   Complete by: As directed    Discharge instructions   Complete by: As directed    You may shower, dressing is waterproof.  Do not remove the dressing, we will remove it at your first post-op appointment.  Do not take a bath or soak the knee in a tub or pool.  You may weightbear as you can tolerate on the operative leg with a walker.  Continue using the CPM machine 3 times per day for one hour each time, increasing the degrees of range of motion daily.  Use the blue cradle boot under your heel to work on getting your  leg straight.  Do NOT put a pillow under your knee.  You will follow-up with Dr. August Saucer in the clinic in 1-2 weeks at your given appointment date.   Increase activity slowly as tolerated   Complete by: As directed       Follow-up Information    [facility] Follow up.   Specialty: Home Health Services Why: home health services arranged Contact information: [address] [place] [address] [phone]        Cammy Copa, MD Follow up  in 2 week(s).   Specialty: Orthopedic Surgery Contact information: [address] [place] [address] [phone]            Signed: Donella Stade 07/01/2019, 8:47 PM

## 2019-07-03 ENCOUNTER — Other Ambulatory Visit: Payer: Self-pay | Admitting: Surgical

## 2019-07-03 ENCOUNTER — Telehealth: Payer: Self-pay | Admitting: Orthopedic Surgery

## 2019-07-03 ENCOUNTER — Telehealth: Payer: Self-pay | Admitting: Internal Medicine

## 2019-07-03 DIAGNOSIS — D649 Anemia, unspecified: Secondary | ICD-10-CM | POA: Diagnosis not present

## 2019-07-03 DIAGNOSIS — J449 Chronic obstructive pulmonary disease, unspecified: Secondary | ICD-10-CM | POA: Diagnosis not present

## 2019-07-03 DIAGNOSIS — I493 Ventricular premature depolarization: Secondary | ICD-10-CM | POA: Diagnosis not present

## 2019-07-03 DIAGNOSIS — G5603 Carpal tunnel syndrome, bilateral upper limbs: Secondary | ICD-10-CM | POA: Diagnosis not present

## 2019-07-03 DIAGNOSIS — Z471 Aftercare following joint replacement surgery: Secondary | ICD-10-CM | POA: Diagnosis not present

## 2019-07-03 DIAGNOSIS — N529 Male erectile dysfunction, unspecified: Secondary | ICD-10-CM | POA: Diagnosis not present

## 2019-07-03 DIAGNOSIS — I471 Supraventricular tachycardia: Secondary | ICD-10-CM | POA: Diagnosis not present

## 2019-07-03 DIAGNOSIS — I10 Essential (primary) hypertension: Secondary | ICD-10-CM | POA: Diagnosis not present

## 2019-07-03 DIAGNOSIS — K760 Fatty (change of) liver, not elsewhere classified: Secondary | ICD-10-CM | POA: Diagnosis not present

## 2019-07-03 MED ORDER — SULFAMETHOXAZOLE-TRIMETHOPRIM 800-160 MG PO TABS: 1.0000 | ORAL_TABLET | Freq: Two times a day (BID) | ORAL | 0 refills | Status: DC

## 2019-07-03 NOTE — Telephone Encounter (Signed)
Patient thinks he has a UTI, he just had knee replacement on 1/21. No appts until this Thursday.

## 2019-07-03 NOTE — Telephone Encounter (Signed)
Pt wife Mark Farley called in said the pt had surgery with dr.dean 06/22/19 and believes he now has a UTI and she states they have tried reaching his primary care doctor but hasn't heard anything back and is wondering if you can send a prescription in for him? If so please have that sent to [facility] on [address] in [place].   [phone]

## 2019-07-03 NOTE — Telephone Encounter (Signed)
Please advise. Thanks.  

## 2019-07-04 NOTE — Telephone Encounter (Signed)
Pt has been treated

## 2019-07-05 ENCOUNTER — Telehealth: Payer: Self-pay | Admitting: Orthopedic Surgery

## 2019-07-05 ENCOUNTER — Encounter: Payer: Self-pay | Admitting: Orthopedic Surgery

## 2019-07-05 DIAGNOSIS — I10 Essential (primary) hypertension: Secondary | ICD-10-CM | POA: Diagnosis not present

## 2019-07-05 DIAGNOSIS — J449 Chronic obstructive pulmonary disease, unspecified: Secondary | ICD-10-CM | POA: Diagnosis not present

## 2019-07-05 DIAGNOSIS — D649 Anemia, unspecified: Secondary | ICD-10-CM | POA: Diagnosis not present

## 2019-07-05 DIAGNOSIS — N529 Male erectile dysfunction, unspecified: Secondary | ICD-10-CM | POA: Diagnosis not present

## 2019-07-05 DIAGNOSIS — I471 Supraventricular tachycardia: Secondary | ICD-10-CM | POA: Diagnosis not present

## 2019-07-05 DIAGNOSIS — G5603 Carpal tunnel syndrome, bilateral upper limbs: Secondary | ICD-10-CM | POA: Diagnosis not present

## 2019-07-05 DIAGNOSIS — Z471 Aftercare following joint replacement surgery: Secondary | ICD-10-CM | POA: Diagnosis not present

## 2019-07-05 DIAGNOSIS — I493 Ventricular premature depolarization: Secondary | ICD-10-CM | POA: Diagnosis not present

## 2019-07-05 DIAGNOSIS — K760 Fatty (change of) liver, not elsewhere classified: Secondary | ICD-10-CM | POA: Diagnosis not present

## 2019-07-05 NOTE — Telephone Encounter (Signed)
Patient called for copy of 06/22/19 op note. He is coming in 2/5 for f/u appt. I advised copy is ready for him. He will sign updated release form when he comes in.

## 2019-07-06 DIAGNOSIS — L304 Erythema intertrigo: Secondary | ICD-10-CM | POA: Diagnosis not present

## 2019-07-07 ENCOUNTER — Ambulatory Visit (INDEPENDENT_AMBULATORY_CARE_PROVIDER_SITE_OTHER): Payer: Medicare PPO

## 2019-07-07 ENCOUNTER — Ambulatory Visit (INDEPENDENT_AMBULATORY_CARE_PROVIDER_SITE_OTHER): Payer: Medicare PPO | Admitting: Orthopedic Surgery

## 2019-07-07 ENCOUNTER — Encounter: Payer: Self-pay | Admitting: Orthopedic Surgery

## 2019-07-07 ENCOUNTER — Other Ambulatory Visit: Payer: Self-pay

## 2019-07-07 DIAGNOSIS — Z96652 Presence of left artificial knee joint: Secondary | ICD-10-CM | POA: Diagnosis not present

## 2019-07-07 DIAGNOSIS — I10 Essential (primary) hypertension: Secondary | ICD-10-CM | POA: Diagnosis not present

## 2019-07-07 DIAGNOSIS — I493 Ventricular premature depolarization: Secondary | ICD-10-CM | POA: Diagnosis not present

## 2019-07-07 DIAGNOSIS — D649 Anemia, unspecified: Secondary | ICD-10-CM | POA: Diagnosis not present

## 2019-07-07 DIAGNOSIS — J449 Chronic obstructive pulmonary disease, unspecified: Secondary | ICD-10-CM | POA: Diagnosis not present

## 2019-07-07 DIAGNOSIS — N529 Male erectile dysfunction, unspecified: Secondary | ICD-10-CM | POA: Diagnosis not present

## 2019-07-07 DIAGNOSIS — K760 Fatty (change of) liver, not elsewhere classified: Secondary | ICD-10-CM | POA: Diagnosis not present

## 2019-07-07 DIAGNOSIS — Z471 Aftercare following joint replacement surgery: Secondary | ICD-10-CM | POA: Diagnosis not present

## 2019-07-07 DIAGNOSIS — I471 Supraventricular tachycardia: Secondary | ICD-10-CM | POA: Diagnosis not present

## 2019-07-07 DIAGNOSIS — G5603 Carpal tunnel syndrome, bilateral upper limbs: Secondary | ICD-10-CM | POA: Diagnosis not present

## 2019-07-07 MED ORDER — HYDROCODONE-ACETAMINOPHEN 5-325 MG PO TABS: 1.0000 | ORAL_TABLET | Freq: Four times a day (QID) | ORAL | 0 refills | Status: DC | PRN

## 2019-07-07 NOTE — Progress Notes (Signed)
Post-Op Visit Note   Patient: Mark Farley.           Date of Birth: August 24, 1945           MRN: [id number] Visit Date: 07/07/2019 PCP: Sherlene Shams, MD   Assessment & Plan:  Chief Complaint:  Chief Complaint  Patient presents with  . Left Knee - Routine Post Op   Visit Diagnoses:  1. Status post total left knee replacement     Plan: Mark Farley is a patient is now 2 weeks out left total knee replacement.  Is been doing well.  On exam he is got full extension and flexion to about 80.  He is getting to 115 on the CPM machine.  Urinary tract symptoms have resolved.  I will refill his Norco and start him on outpatient PT come back in 4 weeks.  All in all he is doing well.  Follow-Up Instructions: Return in about 4 weeks (around 08/04/2019).   Orders:  Orders Placed This Encounter  Procedures  . XR Knee 1-2 Views Left   Meds ordered this encounter  Medications  . HYDROcodone-acetaminophen (NORCO/VICODIN) 5-325 MG tablet    Sig: Take 1 tablet by mouth every 6 (six) hours as needed for moderate pain.    Dispense:  35 tablet    Refill:  0    Imaging: XR Knee 1-2 Views Left  Result Date: 07/07/2019 AP lateral left knee reviewed.  Total knee prosthesis in good position alignment press-fit with no complicating features.   PMFS History: Patient Active Problem List   Diagnosis Date Noted  . Arthritis of left knee 06/22/2019  . S/P total knee arthroplasty, right 03/14/2019  . Educated about COVID-19 virus infection 09/25/2018  . Erectile dysfunction 09/25/2018  . Intention tremor 03/09/2017  . Tingling of left upper extremity 03/09/2017  . Runner's knee, right 03/09/2017  . Hepatic steatosis 03/09/2017  . Hearing loss due to cerumen impaction 03/08/2017  . Encounter for preventive health examination 05/26/2015  . SVT (supraventricular tachycardia) (HCC) 02/07/2015  . History of syncope 03/06/2014  . PVC's (premature ventricular contractions) 03/06/2014  . Osteoarthritis  02/08/2012  . Prediabetes 08/06/2011  . Overweight (BMI 25.0-29.9) 02/06/2011  . Family history of colon cancer requiring screening colonoscopy 02/05/2011  . Mixed hyperlipidemia 08/14/2008  . HYPERTENSION, BENIGN 08/14/2008   Past Medical History:  Diagnosis Date  . Arthritis    ? Gout  . Bilateral carpal tunnel syndrome    managed with nocturnal braces x 3 years  . COPD (chronic obstructive pulmonary disease) (HCC)   . Dysrhythmia    Tachycardia, PVCs, SVTs  . GERD (gastroesophageal reflux disease)   . Glucose intolerance (impaired glucose tolerance)   . Heart murmur    as a child  . History of syncope 2005   Unexplained, myoview negative  . Hyperlipidemia   . Hypertension   . Iron deficiency anemia   . Osteoarthritis of multiple joints   . Palpitations 2010   Life Watch with single PVC  . Pre-diabetes     Family History  Problem Relation Age of Onset  . Coronary artery disease Mother   . Heart attack Mother        MI  . Heart failure Father        CHF  . Colon cancer Brother   . Colon cancer Paternal Grandmother     Past Surgical History:  Procedure Laterality Date  . APPENDECTOMY     1957  . COLONOSCOPY    .  JOINT REPLACEMENT    . TONSILLECTOMY     1952  . TOTAL KNEE ARTHROPLASTY Right 03/14/2019   Procedure: RIGHT TOTAL KNEE ARTHROPLASTY;  Surgeon: Meredith Pel, MD;  Location: [facility];  Service: Orthopedics;  Laterality: Right;  . TOTAL KNEE ARTHROPLASTY Left 06/22/2019   Procedure: LEFT TOTAL KNEE ARTHROPLASTY;  Surgeon: Meredith Pel, MD;  Location: [facility];  Service: Orthopedics;  Laterality: Left;   Social History   Occupational History  . Not on file  Tobacco Use  . Smoking status: Never Smoker  . Smokeless tobacco: Never Used  Substance and Sexual Activity  . Alcohol use: Yes    Alcohol/week: 14.0 standard drinks    Types: 14 Standard drinks or equivalent per week    Comment: 2 glasses of wine a day  . Drug use: No  . Sexual  activity: Yes

## 2019-07-10 ENCOUNTER — Other Ambulatory Visit (HOSPITAL_COMMUNITY): Payer: TRICARE For Life (TFL)

## 2019-07-12 DIAGNOSIS — Z96652 Presence of left artificial knee joint: Secondary | ICD-10-CM | POA: Diagnosis not present

## 2019-07-12 DIAGNOSIS — R2689 Other abnormalities of gait and mobility: Secondary | ICD-10-CM | POA: Diagnosis not present

## 2019-07-12 DIAGNOSIS — M25562 Pain in left knee: Secondary | ICD-10-CM | POA: Diagnosis not present

## 2019-07-14 DIAGNOSIS — R2689 Other abnormalities of gait and mobility: Secondary | ICD-10-CM | POA: Diagnosis not present

## 2019-07-14 DIAGNOSIS — M25562 Pain in left knee: Secondary | ICD-10-CM | POA: Diagnosis not present

## 2019-07-14 DIAGNOSIS — Z96652 Presence of left artificial knee joint: Secondary | ICD-10-CM | POA: Diagnosis not present

## 2019-07-17 DIAGNOSIS — R2689 Other abnormalities of gait and mobility: Secondary | ICD-10-CM | POA: Diagnosis not present

## 2019-07-17 DIAGNOSIS — M25562 Pain in left knee: Secondary | ICD-10-CM | POA: Diagnosis not present

## 2019-07-17 DIAGNOSIS — Z96652 Presence of left artificial knee joint: Secondary | ICD-10-CM | POA: Diagnosis not present

## 2019-07-19 DIAGNOSIS — Z96652 Presence of left artificial knee joint: Secondary | ICD-10-CM | POA: Diagnosis not present

## 2019-07-19 DIAGNOSIS — M25562 Pain in left knee: Secondary | ICD-10-CM | POA: Diagnosis not present

## 2019-07-19 DIAGNOSIS — R2689 Other abnormalities of gait and mobility: Secondary | ICD-10-CM | POA: Diagnosis not present

## 2019-07-21 DIAGNOSIS — M25562 Pain in left knee: Secondary | ICD-10-CM | POA: Diagnosis not present

## 2019-07-21 DIAGNOSIS — R2689 Other abnormalities of gait and mobility: Secondary | ICD-10-CM | POA: Diagnosis not present

## 2019-07-21 DIAGNOSIS — Z96652 Presence of left artificial knee joint: Secondary | ICD-10-CM | POA: Diagnosis not present

## 2019-07-24 DIAGNOSIS — Z96652 Presence of left artificial knee joint: Secondary | ICD-10-CM | POA: Diagnosis not present

## 2019-07-24 DIAGNOSIS — M25562 Pain in left knee: Secondary | ICD-10-CM | POA: Diagnosis not present

## 2019-07-24 DIAGNOSIS — R2689 Other abnormalities of gait and mobility: Secondary | ICD-10-CM | POA: Diagnosis not present

## 2019-07-26 DIAGNOSIS — R2689 Other abnormalities of gait and mobility: Secondary | ICD-10-CM | POA: Diagnosis not present

## 2019-07-26 DIAGNOSIS — M25562 Pain in left knee: Secondary | ICD-10-CM | POA: Diagnosis not present

## 2019-07-26 DIAGNOSIS — Z96652 Presence of left artificial knee joint: Secondary | ICD-10-CM | POA: Diagnosis not present

## 2019-07-28 ENCOUNTER — Inpatient Hospital Stay: Payer: TRICARE For Life (TFL) | Admitting: Orthopedic Surgery

## 2019-07-28 DIAGNOSIS — Z96652 Presence of left artificial knee joint: Secondary | ICD-10-CM | POA: Diagnosis not present

## 2019-07-28 DIAGNOSIS — R2689 Other abnormalities of gait and mobility: Secondary | ICD-10-CM | POA: Diagnosis not present

## 2019-07-28 DIAGNOSIS — M25562 Pain in left knee: Secondary | ICD-10-CM | POA: Diagnosis not present

## 2019-07-31 DIAGNOSIS — R2689 Other abnormalities of gait and mobility: Secondary | ICD-10-CM | POA: Diagnosis not present

## 2019-07-31 DIAGNOSIS — M25562 Pain in left knee: Secondary | ICD-10-CM | POA: Diagnosis not present

## 2019-07-31 DIAGNOSIS — Z96652 Presence of left artificial knee joint: Secondary | ICD-10-CM | POA: Diagnosis not present

## 2019-08-02 ENCOUNTER — Encounter: Payer: Self-pay | Admitting: Orthopedic Surgery

## 2019-08-02 DIAGNOSIS — R2689 Other abnormalities of gait and mobility: Secondary | ICD-10-CM | POA: Diagnosis not present

## 2019-08-02 DIAGNOSIS — Z96652 Presence of left artificial knee joint: Secondary | ICD-10-CM | POA: Diagnosis not present

## 2019-08-02 DIAGNOSIS — M25562 Pain in left knee: Secondary | ICD-10-CM | POA: Diagnosis not present

## 2019-08-04 ENCOUNTER — Encounter: Payer: Self-pay | Admitting: Orthopedic Surgery

## 2019-08-04 ENCOUNTER — Ambulatory Visit (INDEPENDENT_AMBULATORY_CARE_PROVIDER_SITE_OTHER): Payer: Medicare PPO | Admitting: Orthopedic Surgery

## 2019-08-04 ENCOUNTER — Telehealth: Payer: Self-pay | Admitting: Orthopedic Surgery

## 2019-08-04 ENCOUNTER — Other Ambulatory Visit: Payer: Self-pay

## 2019-08-04 DIAGNOSIS — M25562 Pain in left knee: Secondary | ICD-10-CM | POA: Diagnosis not present

## 2019-08-04 DIAGNOSIS — R2689 Other abnormalities of gait and mobility: Secondary | ICD-10-CM | POA: Diagnosis not present

## 2019-08-04 DIAGNOSIS — Z96652 Presence of left artificial knee joint: Secondary | ICD-10-CM

## 2019-08-04 NOTE — Progress Notes (Signed)
Post-Op Visit Note   Patient: Mark Farley.           Date of Birth: 09-13-1945           MRN: [id number] Visit Date: 08/04/2019 PCP: Crecencio Mc, MD   Assessment & Plan:  Chief Complaint:  Chief Complaint  Patient presents with  . Left Knee - Follow-up   Visit Diagnoses:  1. Status post total left knee replacement     Plan: Mark Farley is a patient who is about 6 weeks out left total knee replacement.  Still having some pain with the knee and some difficulty sleeping but no fevers and chills.  On exam he is got flexion to about 80 degrees.  Extension lacks about 7 degrees.  Plan is manipulation under anesthesia at this time risk benefits are discussed.  No calf tenderness today.  Negative Homan.  I like him to start physical therapy 5 days a week also on Monday the day of surgery.  I think we can get him past 90 with manipulation.  His other knee has better patellar mobility than this left knee does currently.  Follow-Up Instructions: Return in about 1 week (around 08/11/2019).   Orders:  No orders of the defined types were placed in this encounter.  No orders of the defined types were placed in this encounter.   Imaging: No results found.  PMFS History: Patient Active Problem List   Diagnosis Date Noted  . Arthritis of left knee 06/22/2019  . S/P total knee arthroplasty, right 03/14/2019  . Educated about COVID-19 virus infection 09/25/2018  . Erectile dysfunction 09/25/2018  . Intention tremor 03/09/2017  . Tingling of left upper extremity 03/09/2017  . Runner's knee, right 03/09/2017  . Hepatic steatosis 03/09/2017  . Hearing loss due to cerumen impaction 03/08/2017  . Encounter for preventive health examination 05/26/2015  . SVT (supraventricular tachycardia) ([facility]) 02/07/2015  . History of syncope 03/06/2014  . PVC's (premature ventricular contractions) 03/06/2014  . Osteoarthritis 02/08/2012  . Prediabetes 08/06/2011  . Overweight (BMI 25.0-29.9)  02/06/2011  . Family history of colon cancer requiring screening colonoscopy 02/05/2011  . Mixed hyperlipidemia 08/14/2008  . HYPERTENSION, BENIGN 08/14/2008   Past Medical History:  Diagnosis Date  . Arthritis    ? Gout  . Bilateral carpal tunnel syndrome    managed with nocturnal braces x 3 years  . COPD (chronic obstructive pulmonary disease) ([facility])   . Dysrhythmia    Tachycardia, PVCs, SVTs  . GERD (gastroesophageal reflux disease)   . Glucose intolerance (impaired glucose tolerance)   . Heart murmur    as a child  . History of syncope 2005   Unexplained, myoview negative  . Hyperlipidemia   . Hypertension   . Iron deficiency anemia   . Osteoarthritis of multiple joints   . Palpitations 2010   Life Watch with single PVC  . Pre-diabetes     Family History  Problem Relation Age of Onset  . Coronary artery disease Mother   . Heart attack Mother        MI  . Heart failure Father        CHF  . Colon cancer Brother   . Colon cancer Paternal Grandmother     Past Surgical History:  Procedure Laterality Date  . APPENDECTOMY     1957  . COLONOSCOPY    . JOINT REPLACEMENT    . TONSILLECTOMY     1952  . TOTAL KNEE ARTHROPLASTY Right 03/14/2019  Procedure: RIGHT TOTAL KNEE ARTHROPLASTY;  Surgeon: Cammy Copa, MD;  Location: [facility] OR;  Service: Orthopedics;  Laterality: Right;  . TOTAL KNEE ARTHROPLASTY Left 06/22/2019   Procedure: LEFT TOTAL KNEE ARTHROPLASTY;  Surgeon: Cammy Copa, MD;  [facility];  Service: Orthopedics;  Laterality: Left;   Social History   Occupational History  . Not on file  Tobacco Use  . Smoking status: Never Smoker  . Smokeless tobacco: Never Used  Substance and Sexual Activity  . Alcohol use: Yes    Alcohol/week: 14.0 standard drinks    Types: 14 Standard drinks or equivalent per week    Comment: 2 glasses of wine a day  . Drug use: No  . Sexual activity: Yes

## 2019-08-04 NOTE — Telephone Encounter (Signed)
Kim with [facility] called regarding the notes from the pts last visit. Selena Batten states that it says begin PT on 08-07-19 for 5 days in a row and she needed some clarification on that order.   [phone]

## 2019-08-04 NOTE — Telephone Encounter (Signed)
I talked with therapist. I read her what OV note states about starting PT after MUA on Monday. She stated that patient advised her he would not be coming on Monday but they will do daily PT visits for next week after Monday. She would like to know if you plan to resume normal treatment plan of PT 2-3 x/wk after next week? Please advise. Thanks.

## 2019-08-07 ENCOUNTER — Other Ambulatory Visit: Payer: Self-pay | Admitting: Surgical

## 2019-08-07 ENCOUNTER — Encounter: Payer: Self-pay | Admitting: Orthopedic Surgery

## 2019-08-07 DIAGNOSIS — G8918 Other acute postprocedural pain: Secondary | ICD-10-CM | POA: Diagnosis not present

## 2019-08-07 DIAGNOSIS — M24662 Ankylosis, left knee: Secondary | ICD-10-CM | POA: Diagnosis not present

## 2019-08-07 DIAGNOSIS — Z96652 Presence of left artificial knee joint: Secondary | ICD-10-CM | POA: Diagnosis not present

## 2019-08-07 MED ORDER — METHOCARBAMOL 500 MG PO TABS: 500.0000 mg | ORAL_TABLET | Freq: Three times a day (TID) | ORAL | 0 refills | Status: DC | PRN

## 2019-08-07 MED ORDER — HYDROCODONE-ACETAMINOPHEN 10-325 MG PO TABS: 1.0000 | ORAL_TABLET | Freq: Four times a day (QID) | ORAL | 0 refills | Status: DC | PRN

## 2019-08-07 MED ORDER — ASPIRIN 81 MG PO CHEW: 81.0000 mg | CHEWABLE_TABLET | Freq: Every day | ORAL | 0 refills | Status: DC

## 2019-08-07 NOTE — Telephone Encounter (Signed)
PT 5 days a week to start and then 2 times a week thereafter I discussed this with Mark Farley his wife also

## 2019-08-07 NOTE — Telephone Encounter (Signed)
Yes.  Basically need CPM machine today.  Okay to start therapy tomorrow.  Thanks

## 2019-08-07 NOTE — Telephone Encounter (Signed)
IC s/w Olegario Messier at [facility] and advised per Dr August Saucer.

## 2019-08-08 DIAGNOSIS — M25562 Pain in left knee: Secondary | ICD-10-CM | POA: Diagnosis not present

## 2019-08-08 DIAGNOSIS — Z96652 Presence of left artificial knee joint: Secondary | ICD-10-CM | POA: Diagnosis not present

## 2019-08-08 DIAGNOSIS — R2689 Other abnormalities of gait and mobility: Secondary | ICD-10-CM | POA: Diagnosis not present

## 2019-08-09 DIAGNOSIS — Z96652 Presence of left artificial knee joint: Secondary | ICD-10-CM | POA: Diagnosis not present

## 2019-08-09 DIAGNOSIS — R2689 Other abnormalities of gait and mobility: Secondary | ICD-10-CM | POA: Diagnosis not present

## 2019-08-09 DIAGNOSIS — M25562 Pain in left knee: Secondary | ICD-10-CM | POA: Diagnosis not present

## 2019-08-10 DIAGNOSIS — M25562 Pain in left knee: Secondary | ICD-10-CM | POA: Diagnosis not present

## 2019-08-10 DIAGNOSIS — Z96652 Presence of left artificial knee joint: Secondary | ICD-10-CM | POA: Diagnosis not present

## 2019-08-10 DIAGNOSIS — R2689 Other abnormalities of gait and mobility: Secondary | ICD-10-CM | POA: Diagnosis not present

## 2019-08-11 DIAGNOSIS — R2689 Other abnormalities of gait and mobility: Secondary | ICD-10-CM | POA: Diagnosis not present

## 2019-08-11 DIAGNOSIS — M25562 Pain in left knee: Secondary | ICD-10-CM | POA: Diagnosis not present

## 2019-08-11 DIAGNOSIS — Z96652 Presence of left artificial knee joint: Secondary | ICD-10-CM | POA: Diagnosis not present

## 2019-08-14 ENCOUNTER — Other Ambulatory Visit: Payer: Self-pay

## 2019-08-14 ENCOUNTER — Encounter: Payer: Self-pay | Admitting: Orthopedic Surgery

## 2019-08-14 ENCOUNTER — Ambulatory Visit (INDEPENDENT_AMBULATORY_CARE_PROVIDER_SITE_OTHER): Payer: Medicare PPO | Admitting: Orthopedic Surgery

## 2019-08-14 DIAGNOSIS — Z96652 Presence of left artificial knee joint: Secondary | ICD-10-CM

## 2019-08-14 DIAGNOSIS — M25562 Pain in left knee: Secondary | ICD-10-CM | POA: Diagnosis not present

## 2019-08-14 DIAGNOSIS — R2689 Other abnormalities of gait and mobility: Secondary | ICD-10-CM | POA: Diagnosis not present

## 2019-08-14 NOTE — Progress Notes (Signed)
Post-Op Visit Note   Patient: Mark Farley.           Date of Birth: 07-01-1945           MRN: [id number] Visit Date: 08/14/2019 PCP: Sherlene Shams, MD   Assessment & Plan:  Chief Complaint:  Chief Complaint  Patient presents with  . Follow-up   Visit Diagnoses: No diagnosis found.  Plan: [name] is a patient is now a week out left knee manipulation under anesthesia.  On exam he is got about 90 of flexion 0 of extension on Celebrex.  Continue therapy 3 times a week for the next 3 weeks.  Continue CPM through this week.  Work on stationary bike and stairs.  No calf tenderness negative Homans today.  Follow-Up Instructions: Return in about 3 weeks (around 09/04/2019).   Orders:  No orders of the defined types were placed in this encounter.  No orders of the defined types were placed in this encounter.   Imaging: No results found.  PMFS History: Patient Active Problem List   Diagnosis Date Noted  . Arthritis of left knee 06/22/2019  . S/P total knee arthroplasty, right 03/14/2019  . Educated about COVID-19 virus infection 09/25/2018  . Erectile dysfunction 09/25/2018  . Intention tremor 03/09/2017  . Tingling of left upper extremity 03/09/2017  . Runner's knee, right 03/09/2017  . Hepatic steatosis 03/09/2017  . Hearing loss due to cerumen impaction 03/08/2017  . Encounter for preventive health examination 05/26/2015  . SVT (supraventricular tachycardia) (HCC) 02/07/2015  . History of syncope 03/06/2014  . PVC's (premature ventricular contractions) 03/06/2014  . Osteoarthritis 02/08/2012  . Prediabetes 08/06/2011  . Overweight (BMI 25.0-29.9) 02/06/2011  . Family history of colon cancer requiring screening colonoscopy 02/05/2011  . Mixed hyperlipidemia 08/14/2008  . HYPERTENSION, BENIGN 08/14/2008   Past Medical History:  Diagnosis Date  . Arthritis    ? Gout  . Bilateral carpal tunnel syndrome    managed with nocturnal braces x 3 years  . COPD (chronic  obstructive pulmonary disease) (HCC)   . Dysrhythmia    Tachycardia, PVCs, SVTs  . GERD (gastroesophageal reflux disease)   . Glucose intolerance (impaired glucose tolerance)   . Heart murmur    as a child  . History of syncope 2005   Unexplained, myoview negative  . Hyperlipidemia   . Hypertension   . Iron deficiency anemia   . Osteoarthritis of multiple joints   . Palpitations 2010   Life Watch with single PVC  . Pre-diabetes     Family History  Problem Relation Age of Onset  . Coronary artery disease Mother   . Heart attack Mother        MI  . Heart failure Father        CHF  . Colon cancer Brother   . Colon cancer Paternal Grandmother     Past Surgical History:  Procedure Laterality Date  . APPENDECTOMY     1957  . COLONOSCOPY    . JOINT REPLACEMENT    . TONSILLECTOMY     1952  . TOTAL KNEE ARTHROPLASTY Right 03/14/2019   Procedure: RIGHT TOTAL KNEE ARTHROPLASTY;  Surgeon: Cammy Copa, MD;  Location: [facility] OR;  Service: Orthopedics;  Laterality: Right;  . TOTAL KNEE ARTHROPLASTY Left 06/22/2019   Procedure: LEFT TOTAL KNEE ARTHROPLASTY;  Surgeon: Cammy Copa, MD;  Location: [facility] OR;  Service: Orthopedics;  Laterality: Left;   Social History   Occupational History  . Not  on file  Tobacco Use  . Smoking status: Never Smoker  . Smokeless tobacco: Never Used  Substance and Sexual Activity  . Alcohol use: Yes    Alcohol/week: 14.0 standard drinks    Types: 14 Standard drinks or equivalent per week    Comment: 2 glasses of wine a day  . Drug use: No  . Sexual activity: Yes

## 2019-08-16 ENCOUNTER — Other Ambulatory Visit: Payer: Self-pay

## 2019-08-16 DIAGNOSIS — R2689 Other abnormalities of gait and mobility: Secondary | ICD-10-CM | POA: Diagnosis not present

## 2019-08-16 DIAGNOSIS — Z96652 Presence of left artificial knee joint: Secondary | ICD-10-CM | POA: Diagnosis not present

## 2019-08-16 DIAGNOSIS — M25562 Pain in left knee: Secondary | ICD-10-CM | POA: Diagnosis not present

## 2019-08-16 MED ORDER — TRAZODONE HCL 50 MG PO TABS: 25.0000 mg | ORAL_TABLET | Freq: Every evening | ORAL | 3 refills | Status: DC | PRN

## 2019-08-18 DIAGNOSIS — M25562 Pain in left knee: Secondary | ICD-10-CM | POA: Diagnosis not present

## 2019-08-18 DIAGNOSIS — Z96652 Presence of left artificial knee joint: Secondary | ICD-10-CM | POA: Diagnosis not present

## 2019-08-18 DIAGNOSIS — R2689 Other abnormalities of gait and mobility: Secondary | ICD-10-CM | POA: Diagnosis not present

## 2019-08-21 DIAGNOSIS — Z96652 Presence of left artificial knee joint: Secondary | ICD-10-CM | POA: Diagnosis not present

## 2019-08-21 DIAGNOSIS — R2689 Other abnormalities of gait and mobility: Secondary | ICD-10-CM | POA: Diagnosis not present

## 2019-08-21 DIAGNOSIS — M25562 Pain in left knee: Secondary | ICD-10-CM | POA: Diagnosis not present

## 2019-08-23 DIAGNOSIS — R2689 Other abnormalities of gait and mobility: Secondary | ICD-10-CM | POA: Diagnosis not present

## 2019-08-23 DIAGNOSIS — M25562 Pain in left knee: Secondary | ICD-10-CM | POA: Diagnosis not present

## 2019-08-23 DIAGNOSIS — Z96652 Presence of left artificial knee joint: Secondary | ICD-10-CM | POA: Diagnosis not present

## 2019-08-25 DIAGNOSIS — R2689 Other abnormalities of gait and mobility: Secondary | ICD-10-CM | POA: Diagnosis not present

## 2019-08-25 DIAGNOSIS — Z96652 Presence of left artificial knee joint: Secondary | ICD-10-CM | POA: Diagnosis not present

## 2019-08-25 DIAGNOSIS — M25562 Pain in left knee: Secondary | ICD-10-CM | POA: Diagnosis not present

## 2019-09-04 DIAGNOSIS — R2689 Other abnormalities of gait and mobility: Secondary | ICD-10-CM | POA: Diagnosis not present

## 2019-09-04 DIAGNOSIS — M25562 Pain in left knee: Secondary | ICD-10-CM | POA: Diagnosis not present

## 2019-09-04 DIAGNOSIS — Z96652 Presence of left artificial knee joint: Secondary | ICD-10-CM | POA: Diagnosis not present

## 2019-09-06 ENCOUNTER — Telehealth: Payer: Self-pay

## 2019-09-06 ENCOUNTER — Encounter: Payer: Self-pay | Admitting: Orthopedic Surgery

## 2019-09-06 ENCOUNTER — Other Ambulatory Visit: Payer: Self-pay

## 2019-09-06 ENCOUNTER — Ambulatory Visit (INDEPENDENT_AMBULATORY_CARE_PROVIDER_SITE_OTHER): Payer: Medicare PPO | Admitting: Orthopedic Surgery

## 2019-09-06 ENCOUNTER — Other Ambulatory Visit: Payer: Self-pay | Admitting: Surgical

## 2019-09-06 DIAGNOSIS — M25562 Pain in left knee: Secondary | ICD-10-CM | POA: Diagnosis not present

## 2019-09-06 DIAGNOSIS — Z96652 Presence of left artificial knee joint: Secondary | ICD-10-CM | POA: Diagnosis not present

## 2019-09-06 DIAGNOSIS — R2689 Other abnormalities of gait and mobility: Secondary | ICD-10-CM | POA: Diagnosis not present

## 2019-09-06 MED ORDER — HYDROCODONE-ACETAMINOPHEN 5-325 MG PO TABS: 1.0000 | ORAL_TABLET | Freq: Every evening | ORAL | 0 refills | Status: DC | PRN

## 2019-09-06 NOTE — Progress Notes (Signed)
Post-Op Visit Note   Patient: Mark Farley.           Date of Birth: 12-12-1945           MRN: [id number] Visit Date: 09/06/2019 PCP: Sherlene Shams, MD   Assessment & Plan:  Chief Complaint:  Chief Complaint  Patient presents with  . Left Knee - Follow-up   Visit Diagnoses:  1. Status post total left knee replacement     Plan: Mark Farley is a patient who is now about a month out left knee manipulation under anesthesia.  On exam he is got about 95 degrees of flexion which is an improvement.  I think we could push him a little bit more to 100-1 05.  He is having little bit of night pain.  Taking hydrocodone but not every night.  That is refilled today.  No calf tenderness negative Homans today in the left leg.  No effusion in the knee.  I we will have him continue with steroid therapy 2-3 times a week for the next 6 weeks and I will see him back in 6 weeks for clinical recheck.  I think he is going to get the left knee comparable to what the right knee is currently.  Follow-Up Instructions: Return in about 6 weeks (around 10/18/2019).   Orders:  No orders of the defined types were placed in this encounter.  No orders of the defined types were placed in this encounter.   Imaging: No results found.  PMFS History: Patient Active Problem List   Diagnosis Date Noted  . Arthritis of left knee 06/22/2019  . S/P total knee arthroplasty, right 03/14/2019  . Educated about COVID-19 virus infection 09/25/2018  . Erectile dysfunction 09/25/2018  . Intention tremor 03/09/2017  . Tingling of left upper extremity 03/09/2017  . Runner's knee, right 03/09/2017  . Hepatic steatosis 03/09/2017  . Hearing loss due to cerumen impaction 03/08/2017  . Encounter for preventive health examination 05/26/2015  . SVT (supraventricular tachycardia) (HCC) 02/07/2015  . History of syncope 03/06/2014  . PVC's (premature ventricular contractions) 03/06/2014  . Osteoarthritis 02/08/2012  .  Prediabetes 08/06/2011  . Overweight (BMI 25.0-29.9) 02/06/2011  . Family history of colon cancer requiring screening colonoscopy 02/05/2011  . Mixed hyperlipidemia 08/14/2008  . HYPERTENSION, BENIGN 08/14/2008   Past Medical History:  Diagnosis Date  . Arthritis    ? Gout  . Bilateral carpal tunnel syndrome    managed with nocturnal braces x 3 years  . COPD (chronic obstructive pulmonary disease) (HCC)   . Dysrhythmia    Tachycardia, PVCs, SVTs  . GERD (gastroesophageal reflux disease)   . Glucose intolerance (impaired glucose tolerance)   . Heart murmur    as a child  . History of syncope 2005   Unexplained, myoview negative  . Hyperlipidemia   . Hypertension   . Iron deficiency anemia   . Osteoarthritis of multiple joints   . Palpitations 2010   Life Watch with single PVC  . Pre-diabetes     Family History  Problem Relation Age of Onset  . Coronary artery disease Mother   . Heart attack Mother        MI  . Heart failure Father        CHF  . Colon cancer Brother   . Colon cancer Paternal Grandmother     Past Surgical History:  Procedure Laterality Date  . APPENDECTOMY     1957  . COLONOSCOPY    .  JOINT REPLACEMENT    . TONSILLECTOMY     1952  . TOTAL KNEE ARTHROPLASTY Right 03/14/2019   Procedure: RIGHT TOTAL KNEE ARTHROPLASTY;  Surgeon: Meredith Pel, MD;  Location: [facility];  Service: Orthopedics;  Laterality: Right;  . TOTAL KNEE ARTHROPLASTY Left 06/22/2019   Procedure: LEFT TOTAL KNEE ARTHROPLASTY;  Surgeon: Meredith Pel, MD;  Location: [facility];  Service: Orthopedics;  Laterality: Left;   Social History   Occupational History  . Not on file  Tobacco Use  . Smoking status: Never Smoker  . Smokeless tobacco: Never Used  Substance and Sexual Activity  . Alcohol use: Yes    Alcohol/week: 14.0 standard drinks    Types: 14 Standard drinks or equivalent per week    Comment: 2 glasses of wine a day  . Drug use: No  . Sexual activity: Yes

## 2019-09-06 NOTE — Telephone Encounter (Signed)
Per Dr August Saucer can you please submit Norco refill for patient 1 po q hs prn #30 no RF

## 2019-09-08 DIAGNOSIS — R2689 Other abnormalities of gait and mobility: Secondary | ICD-10-CM | POA: Diagnosis not present

## 2019-09-08 DIAGNOSIS — Z96652 Presence of left artificial knee joint: Secondary | ICD-10-CM | POA: Diagnosis not present

## 2019-09-08 DIAGNOSIS — M25562 Pain in left knee: Secondary | ICD-10-CM | POA: Diagnosis not present

## 2019-09-11 DIAGNOSIS — R2689 Other abnormalities of gait and mobility: Secondary | ICD-10-CM | POA: Diagnosis not present

## 2019-09-11 DIAGNOSIS — Z96652 Presence of left artificial knee joint: Secondary | ICD-10-CM | POA: Diagnosis not present

## 2019-09-11 DIAGNOSIS — M25562 Pain in left knee: Secondary | ICD-10-CM | POA: Diagnosis not present

## 2019-09-13 DIAGNOSIS — M25562 Pain in left knee: Secondary | ICD-10-CM | POA: Diagnosis not present

## 2019-09-13 DIAGNOSIS — R2689 Other abnormalities of gait and mobility: Secondary | ICD-10-CM | POA: Diagnosis not present

## 2019-09-13 DIAGNOSIS — Z96652 Presence of left artificial knee joint: Secondary | ICD-10-CM | POA: Diagnosis not present

## 2019-09-15 DIAGNOSIS — R2689 Other abnormalities of gait and mobility: Secondary | ICD-10-CM | POA: Diagnosis not present

## 2019-09-15 DIAGNOSIS — M25562 Pain in left knee: Secondary | ICD-10-CM | POA: Diagnosis not present

## 2019-09-15 DIAGNOSIS — Z96652 Presence of left artificial knee joint: Secondary | ICD-10-CM | POA: Diagnosis not present

## 2019-09-18 DIAGNOSIS — Z96652 Presence of left artificial knee joint: Secondary | ICD-10-CM | POA: Diagnosis not present

## 2019-09-18 DIAGNOSIS — R2689 Other abnormalities of gait and mobility: Secondary | ICD-10-CM | POA: Diagnosis not present

## 2019-09-18 DIAGNOSIS — M25562 Pain in left knee: Secondary | ICD-10-CM | POA: Diagnosis not present

## 2019-09-20 DIAGNOSIS — Z96652 Presence of left artificial knee joint: Secondary | ICD-10-CM | POA: Diagnosis not present

## 2019-09-20 DIAGNOSIS — M25562 Pain in left knee: Secondary | ICD-10-CM | POA: Diagnosis not present

## 2019-09-20 DIAGNOSIS — R2689 Other abnormalities of gait and mobility: Secondary | ICD-10-CM | POA: Diagnosis not present

## 2019-09-22 DIAGNOSIS — R2689 Other abnormalities of gait and mobility: Secondary | ICD-10-CM | POA: Diagnosis not present

## 2019-09-22 DIAGNOSIS — M25562 Pain in left knee: Secondary | ICD-10-CM | POA: Diagnosis not present

## 2019-09-22 DIAGNOSIS — Z96652 Presence of left artificial knee joint: Secondary | ICD-10-CM | POA: Diagnosis not present

## 2019-09-29 DIAGNOSIS — M25562 Pain in left knee: Secondary | ICD-10-CM | POA: Diagnosis not present

## 2019-09-29 DIAGNOSIS — Z96652 Presence of left artificial knee joint: Secondary | ICD-10-CM | POA: Diagnosis not present

## 2019-09-29 DIAGNOSIS — R2689 Other abnormalities of gait and mobility: Secondary | ICD-10-CM | POA: Diagnosis not present

## 2019-09-30 NOTE — Progress Notes (Signed)
Evaluation Performed:  Follow-up visit  Date:  10/02/2019   ID:  Mark Farley., DOB [birth year], MRN [id number]  Patient Location:  [address] [place] [address]   Provider location:   Mark Farley, [place] office  PCP:  Mark Mc, MD  Cardiologist:  Mark Farley   Chief Complaint  Patient presents with  . Other    Follow up    History of Present Illness:    Mark Malina. is a 74 y.o. male past medical history of HTN,  HL   previous syncope with negative Myoview in 2005.  h/o palpitations and had a Life Watch monitor for several weeks but no events arthritis,  anemia from iron deficiency Previous Holter monitor showing runs of SVT, rate up to 150 bpm Elevated LFTS on crestor 10 He presents today for follow-up of his palpitations, hypertension  On his last clinic visit had weight loss from 201 down to 172, and changed his diet Completed surgery on his knee for arthritis Denied further tachycardia  CT coronary calcium scoring July 2020 Score 638, 73rd percentile Aorta with mild atherosclerosis We recommended he start Crestor 5 with Zetia Instead he restarted crestor 10  Recheck of cholesterol November 2020 Total cholesterol 138 LDL 64 triglycerides 120 Normal LFTs  Rare leg cramps Works in the garden Weight down another 10 pounds since 06/2019  Has not required diltiazem 30 mg pills for tachycardia  EKG personally reviewed by myself on todays visit Shows normal sinus rhythm rate 65 bpm no significant ST-T wave changes  Past medical history reviewed severe series of tachycardia episodes day after Thanksgiving 2016 Reported he was driving in a car back from a trip, tachycardia seem to happen every 5 minutes. He took several diltiazem, possibly up to 3 or 4 and eventually symptoms seem to improve.    Prior CV studies:   The following studies were reviewed today:  Holter monitor showing frequent  episodes of SVT, short-lived total cholesterol less than 150   Past Medical History:  Diagnosis Date  . Arthritis    ? Gout  . Bilateral carpal tunnel syndrome    managed with nocturnal braces x 3 years  . COPD (chronic obstructive pulmonary disease) ([facility])   . Dysrhythmia    Tachycardia, PVCs, SVTs  . GERD (gastroesophageal reflux disease)   . Glucose intolerance (impaired glucose tolerance)   . Heart murmur    as a child  . History of syncope 2005   Unexplained, myoview negative  . Hyperlipidemia   . Hypertension   . Iron deficiency anemia   . Osteoarthritis of multiple joints   . Palpitations 2010   Life Watch with single PVC  . Pre-diabetes    Past Surgical History:  Procedure Laterality Date  . APPENDECTOMY     1957  . COLONOSCOPY    . JOINT REPLACEMENT    . TONSILLECTOMY     1952  . TOTAL KNEE ARTHROPLASTY Right 03/14/2019   Procedure: RIGHT TOTAL KNEE ARTHROPLASTY;  Surgeon: Mark Pel, MD;  Location: [facility];  Service: Orthopedics;  Laterality: Right;  . TOTAL KNEE ARTHROPLASTY Left 06/22/2019   Procedure: LEFT TOTAL KNEE ARTHROPLASTY;  Surgeon: Mark Pel, MD;  Location: [facility];  Service: Orthopedics;  Laterality: Left;     Current Meds  Medication Sig  . amoxicillin (AMOXIL) 500 MG tablet Take 4 tablets by mouth (2,000 mg) 30-60 minutes prior to dental  procedure. (Patient taking differently: Take 2,000 mg by mouth See admin instructions. Take 4 tablets by mouth (2,000 mg) 30-60 minutes prior to dental procedure.)  . aspirin 81 MG chewable tablet Chew 1 tablet (81 mg total) by mouth daily.  . celecoxib (CELEBREX) 100 MG capsule Take 100 mg by mouth daily.  . clotrimazole (LOTRIMIN) 1 % cream Apply 1 application topically 2 (two) times daily as needed (skin irritation.).   Marland Kitchen diltiazem (CARDIZEM) 30 MG tablet Take 1 tablet (30 mg total) by mouth 4 (four) times daily as needed. (Patient taking differently: Take 30 mg by mouth 4 (four) times daily as  needed (palpitations.). )  . HYDROcodone-acetaminophen (NORCO/VICODIN) 5-325 MG tablet Take 1 tablet by mouth at bedtime as needed for moderate pain.  . hydrocortisone cream 0.5 % Apply 1 application topically 2 (two) times daily as needed for itching (skin irritation/rash.).  Marland Kitchen methocarbamol (ROBAXIN) 500 MG tablet Take 1 tablet (500 mg total) by mouth every 8 (eight) hours as needed for muscle spasms.  . metoprolol succinate (TOPROL-XL) 50 MG 24 hr tablet Take 1 tablet (50 mg total) by mouth daily. (Patient taking differently: Take 50 mg by mouth every evening. )  . Omega-3 Fatty Acids (FISH OIL) 1000 MG CAPS Take 1,000 mg by mouth daily.  Marland Kitchen omeprazole (PRILOSEC) 20 MG capsule Take 1 capsule (20 mg total) by mouth daily.  . rosuvastatin (CRESTOR) 20 MG tablet Take 10 mg by mouth at bedtime.  . sildenafil (REVATIO) 20 MG tablet Take 20-60 mg by mouth daily as needed (erectile dysfunction.).   Marland Kitchen telmisartan-hydrochlorothiazide (MICARDIS HCT) 80-25 MG tablet Take 1 tablet by mouth daily.  Marland Kitchen tiZANidine (ZANAFLEX) 4 MG tablet Take 1 tablet (4 mg total) by mouth every 8 (eight) hours as needed for muscle spasms.  . traZODone (DESYREL) 50 MG tablet Take 0.5-1 tablets (25-50 mg total) by mouth at bedtime as needed for sleep.  . valACYclovir (VALTREX) 1000 MG tablet Take 1-2 g by mouth daily as needed (cold sores/fever blisters).      Allergies:   Bee venom   Social History   Tobacco Use  . Smoking status: Never Smoker  . Smokeless tobacco: Never Used  Substance Use Topics  . Alcohol use: Yes    Alcohol/week: 14.0 standard drinks    Types: 14 Standard drinks or equivalent per week    Comment: 2 glasses of wine a day  . Drug use: No     Current Outpatient Medications on File Prior to Visit  Medication Sig Dispense Refill  . amoxicillin (AMOXIL) 500 MG tablet Take 4 tablets by mouth (2,000 mg) 30-60 minutes prior to dental procedure. (Patient taking differently: Take 2,000 mg by mouth See  admin instructions. Take 4 tablets by mouth (2,000 mg) 30-60 minutes prior to dental procedure.) 10 tablet 0  . aspirin 81 MG chewable tablet Chew 1 tablet (81 mg total) by mouth daily. 30 tablet 0  . celecoxib (CELEBREX) 100 MG capsule Take 100 mg by mouth daily.    . clotrimazole (LOTRIMIN) 1 % cream Apply 1 application topically 2 (two) times daily as needed (skin irritation.).     Marland Kitchen diltiazem (CARDIZEM) 30 MG tablet Take 1 tablet (30 mg total) by mouth 4 (four) times daily as needed. (Patient taking differently: Take 30 mg by mouth 4 (four) times daily as needed (palpitations.). ) 90 tablet 3  . HYDROcodone-acetaminophen (NORCO/VICODIN) 5-325 MG tablet Take 1 tablet by mouth at bedtime as needed for moderate pain. 30 tablet  0  . hydrocortisone cream 0.5 % Apply 1 application topically 2 (two) times daily as needed for itching (skin irritation/rash.).    Marland Kitchen methocarbamol (ROBAXIN) 500 MG tablet Take 1 tablet (500 mg total) by mouth every 8 (eight) hours as needed for muscle spasms. 30 tablet 0  . metoprolol succinate (TOPROL-XL) 50 MG 24 hr tablet Take 1 tablet (50 mg total) by mouth daily. (Patient taking differently: Take 50 mg by mouth every evening. ) 90 tablet 3  . Omega-3 Fatty Acids (FISH OIL) 1000 MG CAPS Take 1,000 mg by mouth daily.    Marland Kitchen omeprazole (PRILOSEC) 20 MG capsule Take 1 capsule (20 mg total) by mouth daily. 90 capsule 3  . rosuvastatin (CRESTOR) 20 MG tablet Take 10 mg by mouth at bedtime.    . sildenafil (REVATIO) 20 MG tablet Take 20-60 mg by mouth daily as needed (erectile dysfunction.).     Marland Kitchen telmisartan-hydrochlorothiazide (MICARDIS HCT) 80-25 MG tablet Take 1 tablet by mouth daily. 90 tablet 1  . tiZANidine (ZANAFLEX) 4 MG tablet Take 1 tablet (4 mg total) by mouth every 8 (eight) hours as needed for muscle spasms. 30 tablet 0  . traZODone (DESYREL) 50 MG tablet Take 0.5-1 tablets (25-50 mg total) by mouth at bedtime as needed for sleep. 30 tablet 3  . valACYclovir  (VALTREX) 1000 MG tablet Take 1-2 g by mouth daily as needed (cold sores/fever blisters).     Marland Kitchen sulfamethoxazole-trimethoprim (BACTRIM DS) 800-160 MG tablet Take 1 tablet by mouth 2 (two) times daily. (Patient not taking: Reported on 10/02/2019) 14 tablet 0   No current facility-administered medications on file prior to visit.     Family Hx: The patient's family history includes Colon cancer in his brother and paternal grandmother; Coronary artery disease in his mother; Heart attack in his mother; Heart failure in his father.  ROS:   Please see the history of present illness.    Review of Systems  Constitutional: Positive for weight loss.  Respiratory: Negative.   Cardiovascular: Negative.   Gastrointestinal: Negative.   Musculoskeletal: Positive for joint pain.  Neurological: Negative.   Psychiatric/Behavioral: Negative.   All other systems reviewed and are negative.    Labs/Other Tests and Data Reviewed:    Recent Labs: 04/10/2019: ALT 13 06/19/2019: BUN 13; Creatinine, Ser 0.84; Hemoglobin 14.9; Platelets 192; Potassium 4.0; Sodium 133   Recent Lipid Panel Lab Results  Component Value Date/Time   CHOL 138 04/10/2019 08:41 AM   TRIG 120.0 04/10/2019 08:41 AM   TRIG 108 05/01/2009 12:00 AM   HDL 49.50 04/10/2019 08:41 AM   CHOLHDL 3 04/10/2019 08:41 AM   LDLCALC 64 04/10/2019 08:41 AM   LDLDIRECT 81.0 03/08/2017 10:06 AM    Wt Readings from Last 3 Encounters:  10/02/19 177 lb 2 oz (80.3 kg)  06/22/19 182 lb (82.6 kg)  06/19/19 188 lb 12.8 oz (85.6 kg)     Exam:    BP (!) 145/90 (BP Location: Left Arm, Patient Position: Sitting, Cuff Size: Normal)   Pulse 67   Ht 5\' 7"  (1.702 m)   Wt 177 lb 2 oz (80.3 kg)   SpO2 96%   BMI 27.74 kg/m   Constitutional:  oriented to person, place, and time. No distress.  HENT:  Head: Grossly normal Eyes:  no discharge. No scleral icterus.  Neck: No JVD, no carotid bruits  Cardiovascular: Regular rate and rhythm, no murmurs  appreciated Pulmonary/Chest: Clear to auscultation bilaterally, no wheezes or rails Abdominal: Soft.  no distension.  no tenderness.  Musculoskeletal: Normal range of motion Neurological:  normal muscle tone. Coordination normal. No atrophy Skin: Skin warm and dry Psychiatric: normal affect, pleasant   ASSESSMENT & PLAN:    SVT (supraventricular tachycardia) (HCC) Denies having significant arrhythmia Has has diltiazem 30 mg pills PRN  HYPERTENSION, BENIGN Blood pressure is well controlled on today's visit. No changes made to the medications.  PVC's (premature ventricular contractions) Denies having ectopy, previously seen on monitor, asymptomatic  Mixed hyperlipidemia Prior transaminitis, seen by GI, has had weight loss since then Unable to exclude Crestor Suspect secondary to fatty liver,  Back on crestor 10 mg daily  CAD with stable angina Coronary calcification score 638 on scan in 2020 Cholesterol at goal, no anginal symptoms  History of syncope No further episodes of near syncope or syncope   Total encounter time more than 25 minutes  Greater than 50% was spent in counseling and coordination of care with the patient  Signed, Julien Nordmann, MD  10/02/2019 8:57 AM    [facility] [address], [place], [place] [address]

## 2019-10-02 ENCOUNTER — Encounter: Payer: Self-pay | Admitting: Cardiovascular Disease

## 2019-10-02 ENCOUNTER — Ambulatory Visit (INDEPENDENT_AMBULATORY_CARE_PROVIDER_SITE_OTHER): Payer: Medicare PPO | Admitting: Cardiovascular Disease

## 2019-10-02 ENCOUNTER — Other Ambulatory Visit: Payer: Self-pay

## 2019-10-02 VITALS — BP 145/90 | HR 65 | Ht 67.0 in | Wt 177.1 lb

## 2019-10-02 DIAGNOSIS — R2689 Other abnormalities of gait and mobility: Secondary | ICD-10-CM | POA: Diagnosis not present

## 2019-10-02 DIAGNOSIS — Z96652 Presence of left artificial knee joint: Secondary | ICD-10-CM | POA: Diagnosis not present

## 2019-10-02 DIAGNOSIS — Z87898 Personal history of other specified conditions: Secondary | ICD-10-CM | POA: Diagnosis not present

## 2019-10-02 DIAGNOSIS — E782 Mixed hyperlipidemia: Secondary | ICD-10-CM | POA: Diagnosis not present

## 2019-10-02 DIAGNOSIS — I471 Supraventricular tachycardia: Secondary | ICD-10-CM

## 2019-10-02 DIAGNOSIS — I493 Ventricular premature depolarization: Secondary | ICD-10-CM

## 2019-10-02 DIAGNOSIS — M25562 Pain in left knee: Secondary | ICD-10-CM | POA: Diagnosis not present

## 2019-10-02 DIAGNOSIS — I10 Essential (primary) hypertension: Secondary | ICD-10-CM

## 2019-10-02 MED ORDER — TELMISARTAN-HCTZ 80-25 MG PO TABS: 1.0000 | ORAL_TABLET | Freq: Every day | ORAL | 1 refills | Status: DC

## 2019-10-02 NOTE — Patient Instructions (Signed)

## 2019-10-04 DIAGNOSIS — M25562 Pain in left knee: Secondary | ICD-10-CM | POA: Diagnosis not present

## 2019-10-04 DIAGNOSIS — R2689 Other abnormalities of gait and mobility: Secondary | ICD-10-CM | POA: Diagnosis not present

## 2019-10-04 DIAGNOSIS — Z96652 Presence of left artificial knee joint: Secondary | ICD-10-CM | POA: Diagnosis not present

## 2019-10-06 DIAGNOSIS — R2689 Other abnormalities of gait and mobility: Secondary | ICD-10-CM | POA: Diagnosis not present

## 2019-10-06 DIAGNOSIS — Z96652 Presence of left artificial knee joint: Secondary | ICD-10-CM | POA: Diagnosis not present

## 2019-10-06 DIAGNOSIS — M25562 Pain in left knee: Secondary | ICD-10-CM | POA: Diagnosis not present

## 2019-10-09 DIAGNOSIS — M25562 Pain in left knee: Secondary | ICD-10-CM | POA: Diagnosis not present

## 2019-10-09 DIAGNOSIS — Z96652 Presence of left artificial knee joint: Secondary | ICD-10-CM | POA: Diagnosis not present

## 2019-10-09 DIAGNOSIS — R2689 Other abnormalities of gait and mobility: Secondary | ICD-10-CM | POA: Diagnosis not present

## 2019-10-11 DIAGNOSIS — R2689 Other abnormalities of gait and mobility: Secondary | ICD-10-CM | POA: Diagnosis not present

## 2019-10-11 DIAGNOSIS — Z96652 Presence of left artificial knee joint: Secondary | ICD-10-CM | POA: Diagnosis not present

## 2019-10-11 DIAGNOSIS — M25562 Pain in left knee: Secondary | ICD-10-CM | POA: Diagnosis not present

## 2019-10-13 DIAGNOSIS — M25562 Pain in left knee: Secondary | ICD-10-CM | POA: Diagnosis not present

## 2019-10-13 DIAGNOSIS — Z96652 Presence of left artificial knee joint: Secondary | ICD-10-CM | POA: Diagnosis not present

## 2019-10-13 DIAGNOSIS — R2689 Other abnormalities of gait and mobility: Secondary | ICD-10-CM | POA: Diagnosis not present

## 2019-10-25 ENCOUNTER — Encounter: Payer: Self-pay | Admitting: Orthopedic Surgery

## 2019-10-25 ENCOUNTER — Other Ambulatory Visit: Payer: Self-pay

## 2019-10-25 ENCOUNTER — Ambulatory Visit (INDEPENDENT_AMBULATORY_CARE_PROVIDER_SITE_OTHER): Payer: Medicare PPO | Admitting: Orthopedic Surgery

## 2019-10-25 DIAGNOSIS — Z96652 Presence of left artificial knee joint: Secondary | ICD-10-CM | POA: Diagnosis not present

## 2019-10-25 DIAGNOSIS — M25562 Pain in left knee: Secondary | ICD-10-CM | POA: Diagnosis not present

## 2019-10-25 DIAGNOSIS — R2689 Other abnormalities of gait and mobility: Secondary | ICD-10-CM | POA: Diagnosis not present

## 2019-10-25 NOTE — Progress Notes (Signed)
Post-Op Visit Note   Patient: Mark Farley.           Date of Birth: 1946-05-12           MRN: [id number] Visit Date: 10/25/2019 PCP: Sherlene Shams, MD   Assessment & Plan:  Chief Complaint:  Chief Complaint  Patient presents with  . Left Knee - Pain, Follow-up   Visit Diagnoses:  1. Status post total left knee replacement     Plan: Mark Farley is now about 2 and half months out left knee manipulation under anesthesia for stiffness following total knee replacement. Is been doing well. He has finished physical therapy. That is going to finish up in June. He is doing walking and working in the garden. Overall he is pleased. Taking only Advil and tramadol for pain. Has some spinal stenosis type symptoms which go down both legs when he walks but he does not want to work that up at this time. No red flag symptoms today. Range of motion wise he is at about 105 in both knees. No effusion. Quad strength is excellent. Plan at this time is for him to continue to work on his own on range of motion exercises. Follow-up with me as needed. Overall he is happy with his knee replacements.  Follow-Up Instructions: Return if symptoms worsen or fail to improve.   Orders:  No orders of the defined types were placed in this encounter.  No orders of the defined types were placed in this encounter.   Imaging: No results found.  PMFS History: Patient Active Problem List   Diagnosis Date Noted  . Arthritis of left knee 06/22/2019  . S/P total knee arthroplasty, right 03/14/2019  . Educated about COVID-19 virus infection 09/25/2018  . Erectile dysfunction 09/25/2018  . Intention tremor 03/09/2017  . Tingling of left upper extremity 03/09/2017  . Runner's knee, right 03/09/2017  . Hepatic steatosis 03/09/2017  . Hearing loss due to cerumen impaction 03/08/2017  . Encounter for preventive health examination 05/26/2015  . SVT (supraventricular tachycardia) (HCC) 02/07/2015  . History of  syncope 03/06/2014  . PVC's (premature ventricular contractions) 03/06/2014  . Osteoarthritis 02/08/2012  . Prediabetes 08/06/2011  . Overweight (BMI 25.0-29.9) 02/06/2011  . Family history of colon cancer requiring screening colonoscopy 02/05/2011  . Mixed hyperlipidemia 08/14/2008  . HYPERTENSION, BENIGN 08/14/2008   Past Medical History:  Diagnosis Date  . Arthritis    ? Gout  . Bilateral carpal tunnel syndrome    managed with nocturnal braces x 3 years  . COPD (chronic obstructive pulmonary disease) (HCC)   . Dysrhythmia    Tachycardia, PVCs, SVTs  . GERD (gastroesophageal reflux disease)   . Glucose intolerance (impaired glucose tolerance)   . Heart murmur    as a child  . History of syncope 2005   Unexplained, myoview negative  . Hyperlipidemia   . Hypertension   . Iron deficiency anemia   . Osteoarthritis of multiple joints   . Palpitations 2010   Life Watch with single PVC  . Pre-diabetes     Family History  Problem Relation Age of Onset  . Coronary artery disease Mother   . Heart attack Mother        MI  . Heart failure Father        CHF  . Colon cancer Brother   . Colon cancer Paternal Grandmother     Past Surgical History:  Procedure Laterality Date  . APPENDECTOMY     1957  .  COLONOSCOPY    . JOINT REPLACEMENT    . TONSILLECTOMY     1952  . TOTAL KNEE ARTHROPLASTY Right 03/14/2019   Procedure: RIGHT TOTAL KNEE ARTHROPLASTY;  Surgeon: Meredith Pel, MD;  Location: [facility];  Service: Orthopedics;  Laterality: Right;  . TOTAL KNEE ARTHROPLASTY Left 06/22/2019   Procedure: LEFT TOTAL KNEE ARTHROPLASTY;  Surgeon: Meredith Pel, MD;  Location: [facility];  Service: Orthopedics;  Laterality: Left;   Social History   Occupational History  . Not on file  Tobacco Use  . Smoking status: Never Smoker  . Smokeless tobacco: Never Used  Substance and Sexual Activity  . Alcohol use: Yes    Alcohol/week: 14.0 standard drinks    Types: 14 Standard drinks  or equivalent per week    Comment: 2 glasses of wine a day  . Drug use: No  . Sexual activity: Yes

## 2019-10-27 DIAGNOSIS — M25562 Pain in left knee: Secondary | ICD-10-CM | POA: Diagnosis not present

## 2019-10-27 DIAGNOSIS — Z96652 Presence of left artificial knee joint: Secondary | ICD-10-CM | POA: Diagnosis not present

## 2019-10-27 DIAGNOSIS — R2689 Other abnormalities of gait and mobility: Secondary | ICD-10-CM | POA: Diagnosis not present

## 2019-10-31 IMAGING — US US ABDOMEN LIMITED
1 series · 14 of 25 positions shown · non-contrast
Comparison: None.

CLINICAL DATA: 72-year-old male with abnormal LFTs.

EXAM:
ULTRASOUND ABDOMEN LIMITED RIGHT UPPER QUADRANT

[Series 1: us abdomen limited · 0.28mm/px · 14 of 48 slices shown]
[im 1/48]
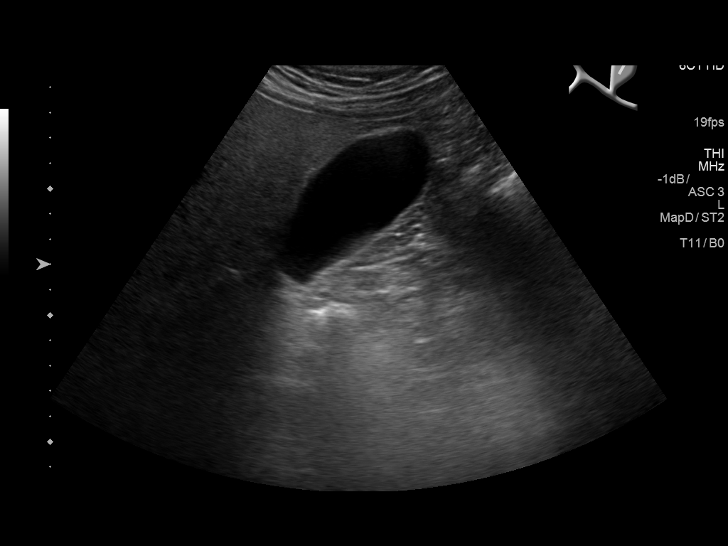
[im 4/48]
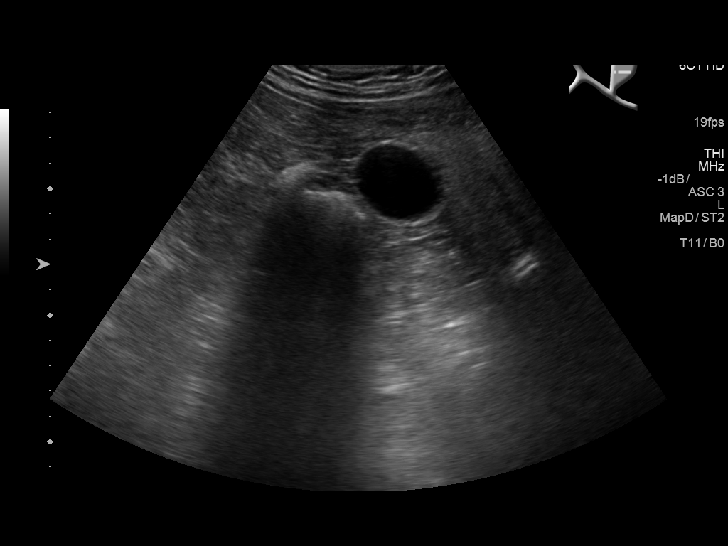
[im 8/48]
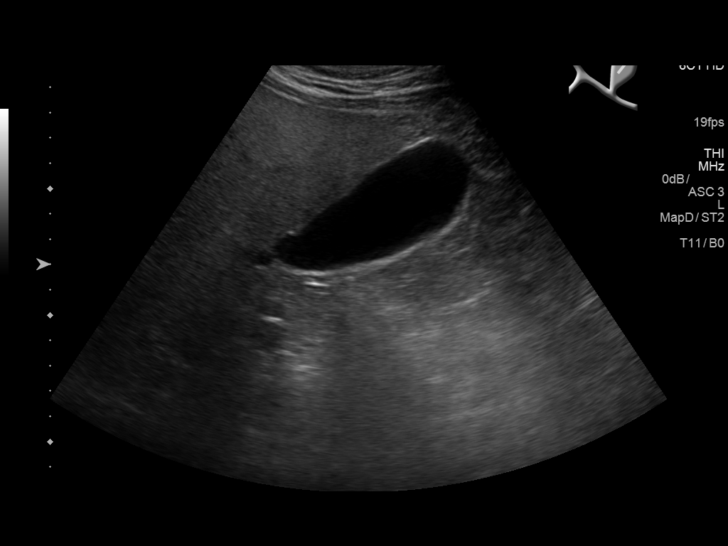
[im 12/48]
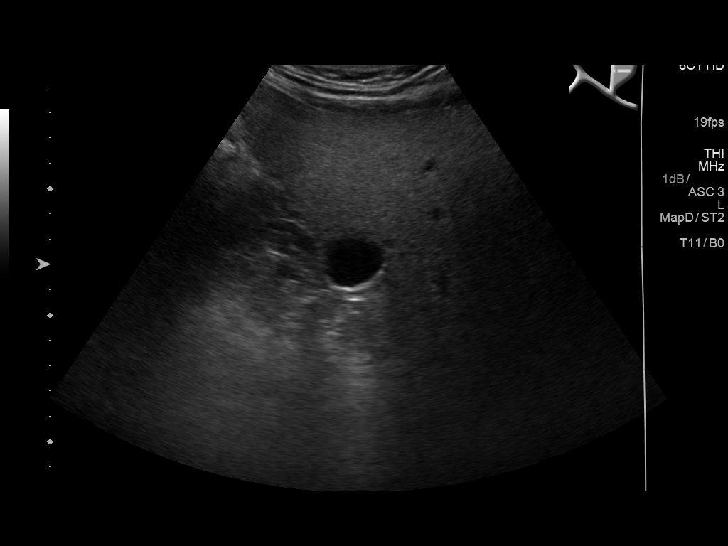
[im 16/48]
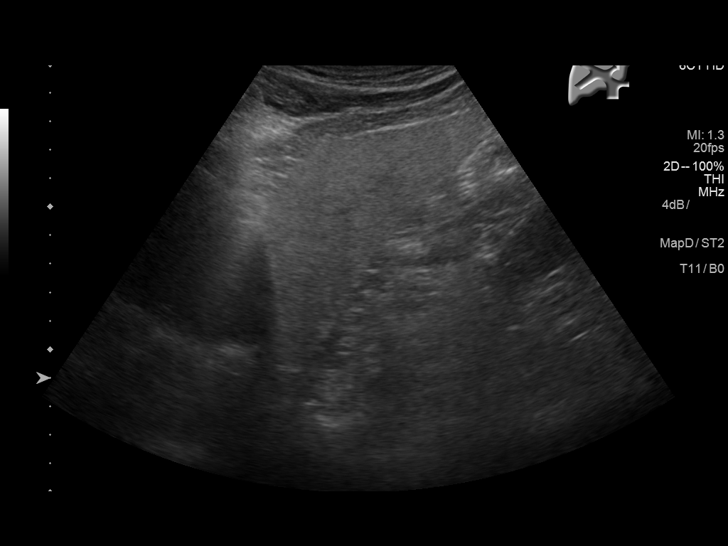
[im 18/48]
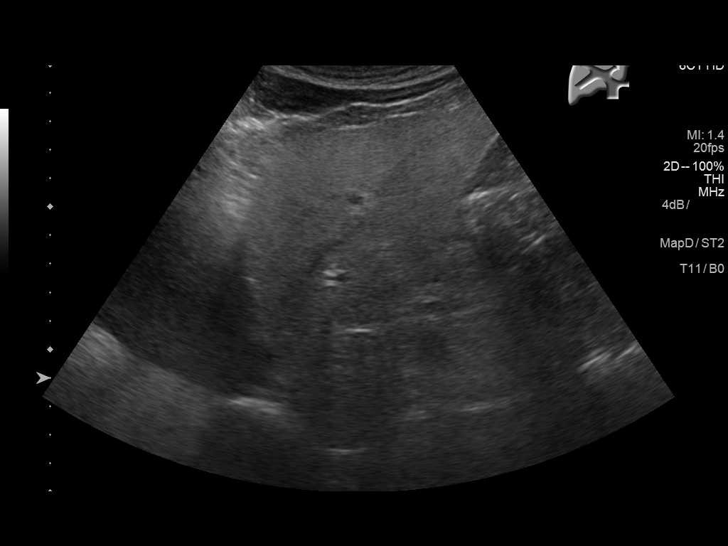
[im 22/48]
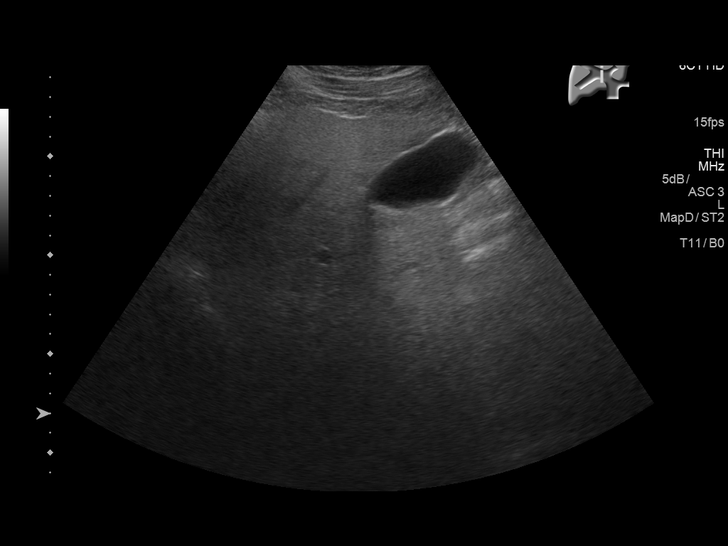
[im 26/48]
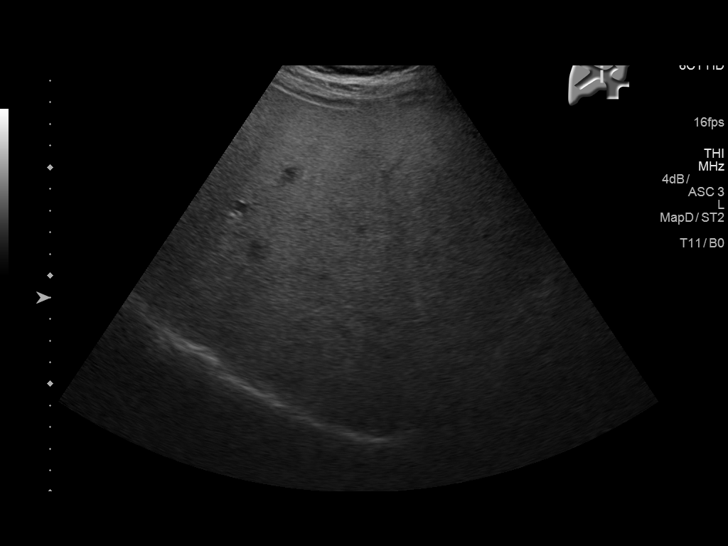
[im 30/48]
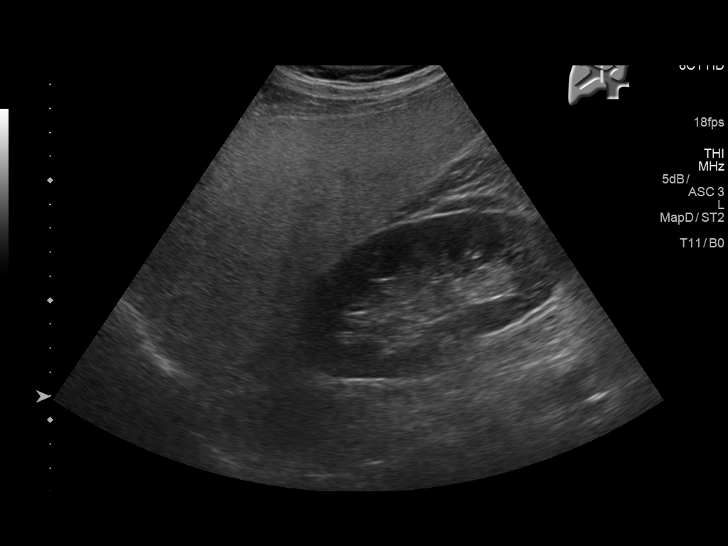
[im 32/48]
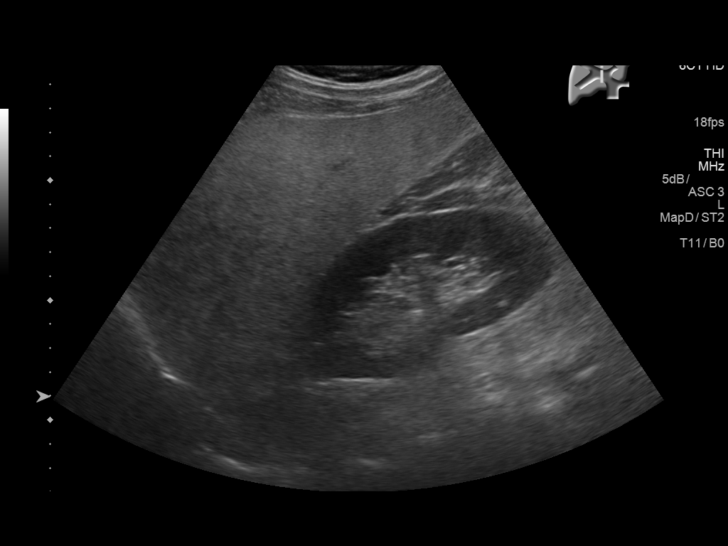
[im 36/48]
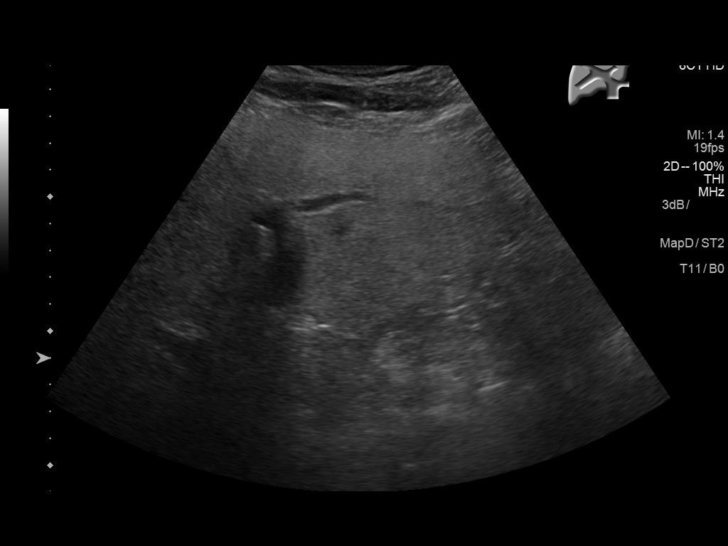
[im 40/48]
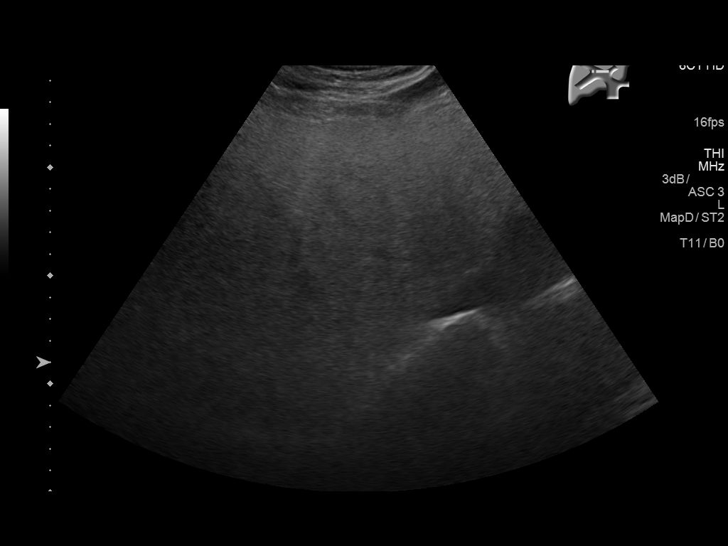
[im 44/48]
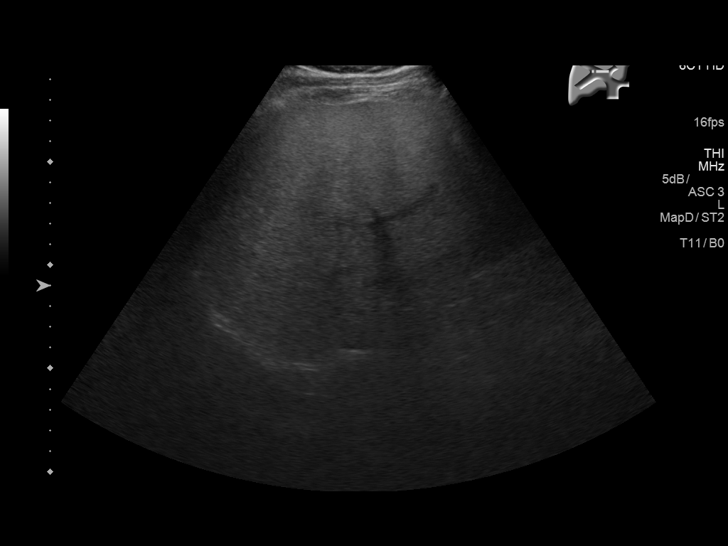
[im 48/48]
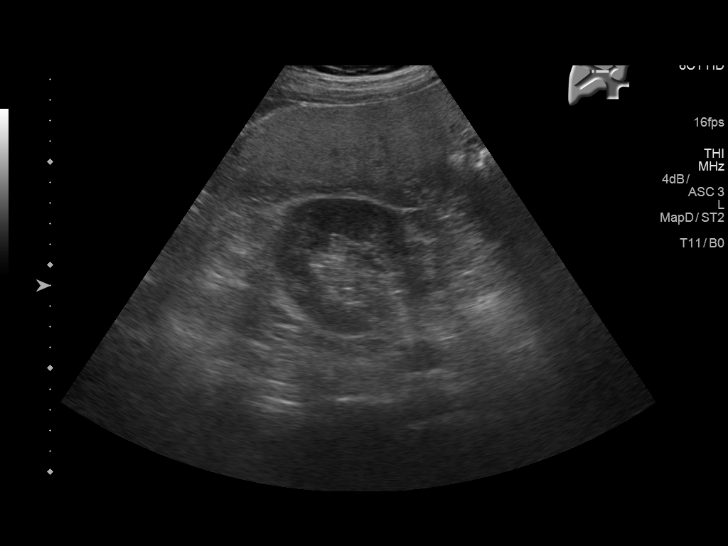

[14 of 25 positions shown; findings below may reference images not displayed]

FINDINGS: Gallbladder:

No gallstones or wall thickening visualized. No sonographic Murphy
sign noted by sonographer.

Common bile duct:

Diameter: 5 millimeters, normal.

Liver:

Echogenic liver (image 31). No discrete liver lesion. No
intrahepatic biliary ductal dilatation. Portal vein is patent on
color Doppler imaging with normal direction of blood flow towards
the liver.

Other findings: Negative visible right kidney.
IMPRESSION: Fatty liver disease, but otherwise negative.

## 2019-11-01 DIAGNOSIS — M25562 Pain in left knee: Secondary | ICD-10-CM | POA: Diagnosis not present

## 2019-11-01 DIAGNOSIS — R2689 Other abnormalities of gait and mobility: Secondary | ICD-10-CM | POA: Diagnosis not present

## 2019-11-01 DIAGNOSIS — Z96652 Presence of left artificial knee joint: Secondary | ICD-10-CM | POA: Diagnosis not present

## 2019-11-18 DIAGNOSIS — Z800 Family history of malignant neoplasm of digestive organs: Secondary | ICD-10-CM

## 2019-12-11 ENCOUNTER — Telehealth (INDEPENDENT_AMBULATORY_CARE_PROVIDER_SITE_OTHER): Payer: Self-pay | Admitting: Gastroenterology

## 2019-12-11 ENCOUNTER — Other Ambulatory Visit: Payer: Self-pay

## 2019-12-11 DIAGNOSIS — Z800 Family history of malignant neoplasm of digestive organs: Secondary | ICD-10-CM

## 2019-12-11 DIAGNOSIS — Z86010 Personal history of colonic polyps: Secondary | ICD-10-CM

## 2019-12-11 DIAGNOSIS — Z1211 Encounter for screening for malignant neoplasm of colon: Secondary | ICD-10-CM

## 2019-12-11 MED ORDER — NA SULFATE-K SULFATE-MG SULF 17.5-3.13-1.6 GM/177ML PO SOLN: 354.0000 mL | Freq: Once | ORAL | 0 refills | Status: AC

## 2019-12-11 NOTE — Progress Notes (Signed)
Gastroenterology Pre-Procedure Review    PATIENT REVIEW QUESTIONS: The patient responded to the following health history questions as indicated:    1. Are you having any GI issues? no 2. Do you have a personal history of Polyps? YES 2016 AT [facility] 3. Do you have a family history of Colon Cancer or Polyps?Brother colon cancer 4. Diabetes Mellitus? no 5. Joint replacements in the past 12 months?Yes In January Left Knee October 2020 right knee replace  6. Major health problems in the past 3 months?no 7. Any artificial heart valves, MVP, or defibrillator?no    MEDICATIONS & ALLERGIES:    Patient reports the following regarding taking any anticoagulation/antiplatelet therapy:   Plavix, Coumadin, Eliquis, Xarelto, Lovenox, Pradaxa, Brilinta, or Effient? no Aspirin? Yes Asprin 81mg    Patient confirms/reports the following medications:  Current Outpatient Medications  Medication Sig Dispense Refill  . amoxicillin (AMOXIL) 500 MG tablet Take 4 tablets by mouth (2,000 mg) 30-60 minutes prior to dental procedure. (Patient taking differently: Take 2,000 mg by mouth See admin instructions. Take 4 tablets by mouth (2,000 mg) 30-60 minutes prior to dental procedure.) 10 tablet 0  . aspirin 81 MG chewable tablet Chew 1 tablet (81 mg total) by mouth daily. 30 tablet 0  . celecoxib (CELEBREX) 100 MG capsule Take 100 mg by mouth daily.    . clotrimazole (LOTRIMIN) 1 % cream Apply 1 application topically 2 (two) times daily as needed (skin irritation.).     diltiazem (CARDIZEM) 30 MG tablet Take 1 tablet (30 mg total) by mouth 4 (four) times daily as needed. (Patient taking differently: Take 30 mg by mouth 4 (four) times daily as needed (palpitations.). ) 90 tablet 3  . HYDROcodone-acetaminophen (NORCO/VICODIN) 5-325 MG tablet Take 1 tablet by mouth at bedtime as needed for moderate pain. 30 tablet 0  . hydrocortisone cream 0.5 % Apply 1 application topically 2 (two) times daily as needed for itching  (skin irritation/rash.).    Marland Kitchen methocarbamol (ROBAXIN) 500 MG tablet Take 1 tablet (500 mg total) by mouth every 8 (eight) hours as needed for muscle spasms. 30 tablet 0  . metoprolol succinate (TOPROL-XL) 50 MG 24 hr tablet Take 1 tablet (50 mg total) by mouth daily. (Patient taking differently: Take 50 mg by mouth every evening. ) 90 tablet 3  . Omega-3 Fatty Acids (FISH OIL) 1000 MG CAPS Take 1,000 mg by mouth daily.    Marland Kitchen omeprazole (PRILOSEC) 20 MG capsule Take 1 capsule (20 mg total) by mouth daily. 90 capsule 3  . rosuvastatin (CRESTOR) 10 MG tablet Take 1 tablet (10 mg total) by mouth daily. 90 tablet 3  . sildenafil (REVATIO) 20 MG tablet Take 20-60 mg by mouth daily as needed (erectile dysfunction.).     Marland Kitchen sulfamethoxazole-trimethoprim (BACTRIM DS) 800-160 MG tablet Take 1 tablet by mouth 2 (two) times daily. (Patient not taking: Reported on 10/02/2019) 14 tablet 0  . telmisartan-hydrochlorothiazide (MICARDIS HCT) 80-25 MG tablet Take 1 tablet by mouth daily. 90 tablet 1  . tiZANidine (ZANAFLEX) 4 MG tablet Take 1 tablet (4 mg total) by mouth every 8 (eight) hours as needed for muscle spasms. 30 tablet 0  . traZODone (DESYREL) 50 MG tablet Take 0.5-1 tablets (25-50 mg total) by mouth at bedtime as needed for sleep. 30 tablet 3  . valACYclovir (VALTREX) 1000 MG tablet Take 1-2 g by mouth daily as needed (cold sores/fever blisters).      No current facility-administered medications for this visit.    Patient  confirms/reports the following allergies:  Allergies  Allergen Reactions  . Bee Venom Swelling    No orders of the defined types were placed in this encounter.   AUTHORIZATION INFORMATION Primary Insurance: 1D#: Group #:  Secondary Insurance: 1D#: Group #:  SCHEDULE INFORMATION: Date: 01/24/2020 Time: Location:[facility]

## 2020-01-04 LAB — COMPREHENSIVE METABOLIC PANEL
Albumin: 4.3 (ref 3.5–5.0)
Calcium: 9 (ref 8.7–10.7)

## 2020-01-04 LAB — CBC AND DIFFERENTIAL
HCT: 42 (ref 41–53)
Hemoglobin: 13.7 (ref 13.5–17.5)
Platelets: 185 (ref 150–399)
WBC: 5.4

## 2020-01-04 LAB — TSH: TSH: 0.72 (ref 0.41–5.90)

## 2020-01-04 LAB — BASIC METABOLIC PANEL
BUN: 20 (ref 4–21)
Creatinine: 0.8 (ref 0.6–1.3)
Glucose: 124
Potassium: 4.4 (ref 3.4–5.3)
Sodium: 134 — AB (ref 137–147)

## 2020-01-04 LAB — PSA: PSA: 0.6

## 2020-01-04 LAB — HEPATIC FUNCTION PANEL
ALT: 28 (ref 10–40)
AST: 16 (ref 14–40)
Alkaline Phosphatase: 68 (ref 25–125)
Bilirubin, Total: 1.4

## 2020-01-04 LAB — LIPID PANEL
Cholesterol: 124 (ref 0–200)
HDL: 51 (ref 35–70)
LDL Cholesterol: 56
Triglycerides: 168 — AB (ref 40–160)

## 2020-01-04 LAB — HEMOGLOBIN A1C: Hemoglobin A1C: 5.8

## 2020-01-10 ENCOUNTER — Telehealth: Payer: Self-pay | Admitting: Internal Medicine

## 2020-01-10 NOTE — Telephone Encounter (Signed)
Placed in yellow results folder.  °

## 2020-01-10 NOTE — Telephone Encounter (Signed)
Pt dropped off envelope with lab work from [place]. Placed in folder up front

## 2020-01-11 NOTE — Telephone Encounter (Signed)
Mychart message has been read by pt.

## 2020-01-11 NOTE — Telephone Encounter (Signed)
Labs reviewed.  All fine,  Including cholesterol and PSA,  Except your fasting glucose was 124 and a1c 5.8 so continue to work on following a low glycemic index diet and daily aerobic exercise   (sent via mychart)  Please abstract  (returned to you)

## 2020-01-22 ENCOUNTER — Other Ambulatory Visit
Admission: RE | Admit: 2020-01-22 | Discharge: 2020-01-22 | Disposition: A | Discharge status: Home / Self Care | Payer: Medicare PPO | Source: Ambulatory Visit | Attending: Internal Medicine | Admitting: Internal Medicine

## 2020-01-22 ENCOUNTER — Other Ambulatory Visit: Payer: Self-pay

## 2020-01-22 DIAGNOSIS — Z20822 Contact with and (suspected) exposure to covid-19: Secondary | ICD-10-CM | POA: Insufficient documentation

## 2020-01-22 DIAGNOSIS — Z01812 Encounter for preprocedural laboratory examination: Secondary | ICD-10-CM | POA: Insufficient documentation

## 2020-01-22 LAB — SARS CORONAVIRUS 2 (TAT 6-24 HRS): SARS Coronavirus 2: NEGATIVE

## 2020-01-23 ENCOUNTER — Encounter: Payer: Self-pay | Admitting: Gastroenterology

## 2020-01-24 ENCOUNTER — Ambulatory Visit: Payer: Medicare PPO | Admitting: Anesthesiology

## 2020-01-24 ENCOUNTER — Ambulatory Visit
Admission: RE | Admit: 2020-01-24 | Discharge: 2020-01-24 | Disposition: A | Discharge status: Home / Self Care | Payer: Medicare PPO | Attending: Gastroenterology | Admitting: Gastroenterology

## 2020-01-24 ENCOUNTER — Encounter
Admission: RE | Disposition: A | Discharge status: Home / Self Care | Payer: Self-pay | Source: Home / Self Care | Attending: Gastroenterology

## 2020-01-24 DIAGNOSIS — J449 Chronic obstructive pulmonary disease, unspecified: Secondary | ICD-10-CM | POA: Insufficient documentation

## 2020-01-24 DIAGNOSIS — Z7982 Long term (current) use of aspirin: Secondary | ICD-10-CM | POA: Insufficient documentation

## 2020-01-24 DIAGNOSIS — R7303 Prediabetes: Secondary | ICD-10-CM | POA: Insufficient documentation

## 2020-01-24 DIAGNOSIS — Z96653 Presence of artificial knee joint, bilateral: Secondary | ICD-10-CM | POA: Insufficient documentation

## 2020-01-24 DIAGNOSIS — M1990 Unspecified osteoarthritis, unspecified site: Secondary | ICD-10-CM | POA: Insufficient documentation

## 2020-01-24 DIAGNOSIS — K219 Gastro-esophageal reflux disease without esophagitis: Secondary | ICD-10-CM | POA: Diagnosis not present

## 2020-01-24 DIAGNOSIS — I10 Essential (primary) hypertension: Secondary | ICD-10-CM | POA: Diagnosis not present

## 2020-01-24 DIAGNOSIS — Z86010 Personal history of colonic polyps: Secondary | ICD-10-CM | POA: Insufficient documentation

## 2020-01-24 DIAGNOSIS — Z79899 Other long term (current) drug therapy: Secondary | ICD-10-CM | POA: Diagnosis not present

## 2020-01-24 DIAGNOSIS — Z800 Family history of malignant neoplasm of digestive organs: Secondary | ICD-10-CM | POA: Diagnosis not present

## 2020-01-24 DIAGNOSIS — Z791 Long term (current) use of non-steroidal anti-inflammatories (NSAID): Secondary | ICD-10-CM | POA: Insufficient documentation

## 2020-01-24 DIAGNOSIS — E785 Hyperlipidemia, unspecified: Secondary | ICD-10-CM | POA: Insufficient documentation

## 2020-01-24 DIAGNOSIS — Z1211 Encounter for screening for malignant neoplasm of colon: Secondary | ICD-10-CM | POA: Diagnosis not present

## 2020-01-24 HISTORY — PX: COLONOSCOPY WITH PROPOFOL: SHX5780

## 2020-01-24 SURGERY — COLONOSCOPY WITH PROPOFOL
Anesthesia: General

## 2020-01-24 MED ORDER — SODIUM CHLORIDE 0.9 % IV SOLN
INTRAVENOUS | Status: DC
  Administered 2020-01-24: 20 mL/h via INTRAVENOUS

## 2020-01-24 MED ORDER — PROPOFOL 10 MG/ML IV BOLUS
INTRAVENOUS | Status: AC
  Filled 2020-01-24: qty 20

## 2020-01-24 MED ORDER — FENTANYL CITRATE (PF) 100 MCG/2ML IJ SOLN
INTRAMUSCULAR | Status: AC
  Filled 2020-01-24: qty 2

## 2020-01-24 MED ORDER — PHENYLEPHRINE HCL (PRESSORS) 10 MG/ML IV SOLN
INTRAVENOUS | Status: DC | PRN
  Administered 2020-01-24: 50 ug via INTRAVENOUS

## 2020-01-24 MED ORDER — ONDANSETRON HCL 4 MG/2ML IJ SOLN
INTRAMUSCULAR | Status: DC | PRN
  Administered 2020-01-24: 4 mg via INTRAVENOUS

## 2020-01-24 MED ORDER — MIDAZOLAM HCL 2 MG/2ML IJ SOLN
INTRAMUSCULAR | Status: DC | PRN
  Administered 2020-01-24 (×2): 1 mg via INTRAVENOUS

## 2020-01-24 MED ORDER — PROPOFOL 500 MG/50ML IV EMUL
INTRAVENOUS | Status: AC
  Filled 2020-01-24: qty 50

## 2020-01-24 MED ORDER — PROPOFOL 500 MG/50ML IV EMUL
INTRAVENOUS | Status: DC | PRN
  Administered 2020-01-24: 75 ug/kg/min via INTRAVENOUS

## 2020-01-24 MED ORDER — PROPOFOL 10 MG/ML IV BOLUS
INTRAVENOUS | Status: DC | PRN
  Administered 2020-01-24: 40 mg via INTRAVENOUS

## 2020-01-24 MED ORDER — MIDAZOLAM HCL 2 MG/2ML IJ SOLN
INTRAMUSCULAR | Status: AC
  Filled 2020-01-24: qty 2

## 2020-01-24 MED ORDER — FENTANYL CITRATE (PF) 100 MCG/2ML IJ SOLN
INTRAMUSCULAR | Status: DC | PRN
Start: 2020-01-24 — End: 2020-01-24
  Administered 2020-01-24: 50 ug via INTRAVENOUS
  Administered 2020-01-24 (×2): 25 ug via INTRAVENOUS

## 2020-01-24 NOTE — Transfer of Care (Signed)
Immediate Anesthesia Transfer of Care Note  Patient: Mark Farley.  Procedure(s) Performed: COLONOSCOPY WITH PROPOFOL (N/A )  Patient Location: PACU  Anesthesia Type:General  Level of Consciousness: awake, alert  and oriented  Airway & Oxygen Therapy: Patient Spontanous Breathing and Patient connected to nasal cannula oxygen  Post-op Assessment: Report given to RN and Post -op Vital signs reviewed and stable  Post vital signs: Reviewed and stable  Last Vitals:  Vitals Value Taken Time  BP    Temp    Pulse    Resp    SpO2      Last Pain:  Vitals:   01/24/20 0840  TempSrc: Temporal  PainSc:          Complications: No complications documented.

## 2020-01-24 NOTE — Op Note (Signed)
[facility] Gastroenterology Patient Name: Mark Farley Procedure Date: 01/24/2020 7:28 AM MRN: [id number] Account #: [id number] Date of Birth: 1945/12/16 Admit Type: Outpatient Age: 74 Room: [facility] ENDO ROOM 4 Gender: Male Note Status: Finalized Procedure:             Colonoscopy Indications:           Screening in patient at increased risk: Colorectal                         cancer in brother before age 75, Surveillance:                         Personal history of colonic polyps (unknown histology)                         on last colonoscopy more than 5 years ago Providers:             Lin Landsman MD, MD Medicines:             Monitored Anesthesia Care Complications:         No immediate complications. Estimated blood loss: None. Procedure:             Pre-Anesthesia Assessment:                        - Prior to the procedure, a History and Physical was                         performed, and patient medications and allergies were                         reviewed. The patient is competent. The risks and                         benefits of the procedure and the sedation options and                         risks were discussed with the patient. All questions                         were answered and informed consent was obtained.                         Patient identification and proposed procedure were                         verified by the physician, the nurse, the                         anesthesiologist, the anesthetist and the technician                         in the pre-procedure area in the procedure room in the                         endoscopy suite. Mental Status Examination: alert and  oriented. Airway Examination: normal oropharyngeal                         airway and neck mobility. Respiratory Examination:                         clear to auscultation. CV Examination: normal.                         Prophylactic  Antibiotics: The patient does not require                         prophylactic antibiotics. Prior Anticoagulants: The                         patient has taken no previous anticoagulant or                         antiplatelet agents. ASA Grade Assessment: II - A                         patient with mild systemic disease. After reviewing                         the risks and benefits, the patient was deemed in                         satisfactory condition to undergo the procedure. The                         anesthesia plan was to use monitored anesthesia care                         (MAC). Immediately prior to administration of                         medications, the patient was re-assessed for adequacy                         to receive sedatives. The heart rate, respiratory                         rate, oxygen saturations, blood pressure, adequacy of                         pulmonary ventilation, and response to care were                         monitored throughout the procedure. The physical                         status of the patient was re-assessed after the                         procedure.                        After obtaining informed consent, the colonoscope was  passed under direct vision. Throughout the procedure,                         the patient's blood pressure, pulse, and oxygen                         saturations were monitored continuously. The                         Colonoscope was introduced through the anus and                         advanced to the the terminal ileum, with                         identification of the appendiceal orifice and IC                         valve. The colonoscopy was performed without                         difficulty. The patient tolerated the procedure well.                         The quality of the bowel preparation was evaluated                         using the BBPS [facility] Bowel Preparation Scale)  with                         scores of: Right Colon = 3, Transverse Colon = 3 and                         Left Colon = 3 (entire mucosa seen well with no                         residual staining, small fragments of stool or opaque                         liquid). The total BBPS score equals 9. Findings:      The perianal and digital rectal examinations were normal. Pertinent       negatives include normal sphincter tone and no palpable rectal lesions.      The terminal ileum appeared normal.      The entire examined colon appeared normal.      The retroflexed view of the distal rectum and anal verge was normal and       showed no anal or rectal abnormalities. Impression:            - The examined portion of the ileum was normal.                        - The entire examined colon is normal.                        - The distal rectum and anal verge are normal on  retroflexion view.                        - No specimens collected. Recommendation:        - Discharge patient to home (with escort).                        - Resume previous diet today.                        - Continue present medications.                        - Repeat colonoscopy in 5 years for screening purposes. Procedure Code(s):     --- Professional ---                        V7858, Colorectal cancer screening; colonoscopy on                         individual at high risk Diagnosis Code(s):     --- Professional ---                        Z80.0, Family history of malignant neoplasm of                         digestive organs                        Z86.010, Personal history of colonic polyps CPT copyright 2019 American Medical Association. All rights reserved. The codes documented in this report are preliminary and upon coder review may  be revised to meet current compliance requirements. Dr. Ulyess Mort Lin Landsman MD, MD 01/24/2020 8:41:49 AM This report has been signed  electronically. Number of Addenda: 0 Note Initiated On: 01/24/2020 7:28 AM Scope Withdrawal Time: 0 hours 7 minutes 10 seconds  Total Procedure Duration: 0 hours 9 minutes 26 seconds  Estimated Blood Loss:  Estimated blood loss: none.      [facility]

## 2020-01-24 NOTE — H&P (Signed)
Mark Repress, MD [address]  [address]  [place], [place] [address]  Main: [phone]  Fax: [phone] Pager: [phone]  Primary Care Physician:  Sherlene Shams, MD Primary Gastroenterologist:  Dr. Arlyss Farley  Pre-Procedure History & Physical: HPI:  Mark Farley. is a 74 y.o. male is here for an colonoscopy.   Past Medical History:  Diagnosis Date  . Arthritis    ? Gout  . Bilateral carpal tunnel syndrome    managed with nocturnal braces x 3 years  . COPD (chronic obstructive pulmonary disease) (HCC)   . Dysrhythmia    Tachycardia, PVCs, SVTs  . GERD (gastroesophageal reflux disease)   . Glucose intolerance (impaired glucose tolerance)   . Heart murmur    as a child  . History of syncope 2005   Unexplained, myoview negative  . Hyperlipidemia   . Hypertension   . Iron deficiency anemia   . Osteoarthritis of multiple joints   . Palpitations 2010   Life Watch with single PVC  . Pre-diabetes     Past Surgical History:  Procedure Laterality Date  . APPENDECTOMY     1957  . COLONOSCOPY    . JOINT REPLACEMENT    . TONSILLECTOMY     1952  . TOTAL KNEE ARTHROPLASTY Right 03/14/2019   Procedure: RIGHT TOTAL KNEE ARTHROPLASTY;  Surgeon: Cammy Copa, MD;  Location: [facility] OR;  Service: Orthopedics;  Laterality: Right;  . TOTAL KNEE ARTHROPLASTY Left 06/22/2019   Procedure: LEFT TOTAL KNEE ARTHROPLASTY;  Surgeon: Cammy Copa, MD;  Location: [facility] OR;  Service: Orthopedics;  Laterality: Left;    Prior to Admission medications   Medication Sig Start Date End Date Taking? Authorizing Provider  amoxicillin (AMOXIL) 500 MG tablet Take 4 tablets by mouth (2,000 mg) 30-60 minutes prior to dental procedure. Patient taking differently: Take 2,000 mg by mouth See admin instructions. Take 4 tablets by mouth (2,000 mg) 30-60 minutes prior to dental procedure. 06/08/19  Yes Magnant, Joycie Peek, PA-C  aspirin 81 MG chewable tablet Chew 1 tablet (81 mg  total) by mouth daily. 08/07/19  Yes Magnant, Charles L, PA-C  celecoxib (CELEBREX) 100 MG capsule Take 100 mg by mouth daily. 01/19/19  Yes [provider]  clotrimazole (LOTRIMIN) 1 % cream Apply 1 application topically 2 (two) times daily as needed (skin irritation.).    Yes [provider]  diltiazem (CARDIZEM) 30 MG tablet Take 1 tablet (30 mg total) by mouth 4 (four) times daily as needed. Patient taking differently: Take 30 mg by mouth 4 (four) times daily as needed (palpitations.).  02/07/15  Yes Gollan, Tollie Pizza, MD  HYDROcodone-acetaminophen (NORCO/VICODIN) 5-325 MG tablet Take 1 tablet by mouth at bedtime as needed for moderate pain. 09/06/19 09/05/20 Yes Magnant, Joycie Peek, PA-C  hydrocortisone cream 0.5 % Apply 1 application topically 2 (two) times daily as needed for itching (skin irritation/rash.).   Yes [provider]  methocarbamol (ROBAXIN) 500 MG tablet Take 1 tablet (500 mg total) by mouth every 8 (eight) hours as needed for muscle spasms. 08/07/19  Yes Magnant, Charles L, PA-C  metoprolol succinate (TOPROL-XL) 50 MG 24 hr tablet Take 1 tablet (50 mg total) by mouth daily. Patient taking differently: Take 50 mg by mouth every evening.  02/02/17  Yes Gollan, Tollie Pizza, MD  Omega-3 Fatty Acids (FISH OIL) 1000 MG CAPS Take 1,000 mg by mouth daily.   Yes [provider]  omeprazole (PRILOSEC) 20 MG capsule Take  1 capsule (20 mg total) by mouth daily. 11/06/14  Yes Sherlene Shams, MD  rosuvastatin (CRESTOR) 10 MG tablet Take 1 tablet (10 mg total) by mouth daily. 10/02/19  Yes Antonieta Iba, MD  sildenafil (REVATIO) 20 MG tablet Take 20-60 mg by mouth daily as needed (erectile dysfunction.).  12/21/18  Yes [provider]  sulfamethoxazole-trimethoprim (BACTRIM DS) 800-160 MG tablet Take 1 tablet by mouth 2 (two) times daily. 07/03/19  Yes Magnant, Joycie Peek, PA-C  telmisartan-hydrochlorothiazide (MICARDIS HCT) 80-25 MG tablet Take 1 tablet by mouth  daily. 10/02/19  Yes Gollan, Tollie Pizza, MD  tiZANidine (ZANAFLEX) 4 MG tablet Take 1 tablet (4 mg total) by mouth every 8 (eight) hours as needed for muscle spasms. 06/08/19 06/07/20 Yes Magnant, Charles L, PA-C  traZODone (DESYREL) 50 MG tablet Take 0.5-1 tablets (25-50 mg total) by mouth at bedtime as needed for sleep. 08/16/19  Yes Sherlene Shams, MD  valACYclovir (VALTREX) 1000 MG tablet Take 1-2 g by mouth daily as needed (cold sores/fever blisters).  02/07/19  Yes [provider]    Allergies as of 12/11/2019 - Review Complete 10/25/2019  Allergen Reaction Noted  . Bee venom Swelling 03/14/2019    Family History  Problem Relation Age of Onset  . Coronary artery disease Mother   . Heart attack Mother        MI  . Heart failure Father        CHF  . Colon cancer Brother   . Colon cancer Paternal Grandmother     Social History   Socioeconomic History  . Marital status: Married    Spouse name: Not on file  . Number of children: Not on file  . Years of education: Not on file  . Highest education level: Not on file  Occupational History  . Not on file  Tobacco Use  . Smoking status: Never Smoker  . Smokeless tobacco: Never Used  Vaping Use  . Vaping Use: Never used  Substance and Sexual Activity  . Alcohol use: Yes    Alcohol/week: 14.0 standard drinks    Types: 14 Standard drinks or equivalent per week    Comment: 2 glasses of wine a day  . Drug use: No  . Sexual activity: Yes  Other Topics Concern  . Not on file  Social History Narrative   Married   Very active   Social Determinants of Health   Financial Resource Strain:   . Difficulty of Paying Living Expenses: Not on file  Food Insecurity:   . Worried About Programme researcher, broadcasting/film/video in the Last Year: Not on file  . Ran Out of Food in the Last Year: Not on file  Transportation Needs:   . Lack of Transportation (Medical): Not on file  . Lack of Transportation (Non-Medical): Not on file  Physical Activity:   .  Days of Exercise per Week: Not on file  . Minutes of Exercise per Session: Not on file  Stress:   . Feeling of Stress : Not on file  Social Connections:   . Frequency of Communication with Friends and Family: Not on file  . Frequency of Social Gatherings with Friends and Family: Not on file  . Attends Religious Services: Not on file  . Active Member of Clubs or Organizations: Not on file  . Attends Banker Meetings: Not on file  . Marital Status: Not on file  Intimate Partner Violence:   . Fear of Current or Ex-Partner: Not  on file  . Emotionally Abused: Not on file  . Physically Abused: Not on file  . Sexually Abused: Not on file    Review of Systems: See HPI, otherwise negative ROS  Physical Exam: Pulse 69   Temp (!) 96.9 F (36.1 C) (Temporal)   Resp 20   Ht 5\' 6"  (1.676 m)   Wt 78.6 kg   SpO2 99%   BMI 27.97 kg/m  General:   Alert,  pleasant and cooperative in NAD Head:  Normocephalic and atraumatic. Neck:  Supple; no masses or thyromegaly. Lungs:  Clear throughout to auscultation.    Heart:  Regular rate and rhythm. Abdomen:  Soft, nontender and nondistended. Normal bowel sounds, without guarding, and without rebound.   Neurologic:  Alert and  oriented x4;  grossly normal neurologically.  Impression/Plan: . is here for an colonoscopy to be performed for h/o colon polyps  Risks, benefits, limitations, and alternatives regarding  colonoscopy have been reviewed with the patient.  Questions have been answered.  All parties agreeable.   Benay Pillow, MD  01/24/2020, 8:23 AM

## 2020-01-24 NOTE — Anesthesia Preprocedure Evaluation (Signed)
Anesthesia Evaluation  Patient identified by MRN, date of birth, ID band Patient awake    Reviewed: Allergy & Precautions, H&P , NPO status , Patient's Chart, lab work & pertinent test results  History of Anesthesia Complications Negative for: history of anesthetic complications  Airway Mallampati: II  TM Distance: >3 FB Neck ROM: full    Dental no notable dental hx. (+) Teeth Intact   Pulmonary neg pulmonary ROS, neg sleep apnea, neg COPD, Patient abstained from smoking.Not current smoker,  COPD listed in chart, but patient denies, never smoker   Pulmonary exam normal breath sounds clear to auscultation       Cardiovascular Exercise Tolerance: Good METShypertension, (-) CAD and (-) Past MI + dysrhythmias Supra Ventricular Tachycardia  Rhythm:regular Rate:Normal - Systolic murmurs    Neuro/Psych  Neuromuscular disease negative neurological ROS  negative psych ROS   GI/Hepatic GERD  ,(+)     (-) substance abuse  ,   Endo/Other  neg diabetes  Renal/GU negative Renal ROS     Musculoskeletal  (+) Arthritis ,   Abdominal   Peds  Hematology   Anesthesia Other Findings Past Medical History: No date: Arthritis     Comment:  ? Gout No date: Bilateral carpal tunnel syndrome     Comment:  managed with nocturnal braces x 3 years No date: COPD (chronic obstructive pulmonary disease) (HCC) No date: Dysrhythmia     Comment:  Tachycardia, PVCs, SVTs No date: GERD (gastroesophageal reflux disease) No date: Glucose intolerance (impaired glucose tolerance) No date: Heart murmur     Comment:  as a child 2005: History of syncope     Comment:  Unexplained, myoview negative No date: Hyperlipidemia No date: Hypertension No date: Iron deficiency anemia No date: Osteoarthritis of multiple joints 2010: Palpitations     Comment:  Life Watch with single PVC No date: Pre-diabetes  Reproductive/Obstetrics                              Anesthesia Physical  Anesthesia Plan  ASA: II  Anesthesia Plan: General   Post-op Pain Management:    Induction: Intravenous  PONV Risk Score and Plan: 2 and Propofol infusion, Ondansetron and Treatment may vary due to age or medical condition  Airway Management Planned: Simple Face Mask  Additional Equipment: None  Intra-op Plan:   Post-operative Plan:   Informed Consent: I have reviewed the patients History and Physical, chart, labs and discussed the procedure including the risks, benefits and alternatives for the proposed anesthesia with the patient or authorized representative who has indicated his/her understanding and acceptance.     Dental advisory given  Plan Discussed with: CRNA and Surgeon  Anesthesia Plan Comments: (Discussed risks of anesthesia with patient, including possibility of difficulty with spontaneous ventilation under anesthesia necessitating airway intervention, PONV, and rare risks such as cardiac or respiratory or neurological events. Patient understands.)       Anesthesia Quick Evaluation

## 2020-01-24 NOTE — Anesthesia Postprocedure Evaluation (Signed)
Anesthesia Post Note  Patient: Mark Farley.  Procedure(s) Performed: COLONOSCOPY WITH PROPOFOL (N/A )  Patient location during evaluation: Endoscopy Anesthesia Type: General Level of consciousness: awake and alert Pain management: pain level controlled Vital Signs Assessment: post-procedure vital signs reviewed and stable Respiratory status: spontaneous breathing, nonlabored ventilation, respiratory function stable and patient connected to nasal cannula oxygen Cardiovascular status: blood pressure returned to baseline and stable Postop Assessment: no apparent nausea or vomiting Anesthetic complications: no   No complications documented.   Last Vitals:  Vitals:   01/24/20 0850 01/24/20 0900  BP: 111/62 117/73  Pulse: (!) 58 (!) 52  Resp: 16 11  Temp:    SpO2: 97% 95%    Last Pain:  Vitals:   01/24/20 0840  TempSrc: Temporal  PainSc:                  Corinda Gubler

## 2020-01-25 ENCOUNTER — Encounter: Payer: Self-pay | Admitting: Gastroenterology

## 2020-01-25 NOTE — Progress Notes (Signed)
°   01/24/20 0745  Clinical Encounter Type  Visited With Family  Visit Type Initial  Referral From Chaplain  Consult/Referral To Chaplain  While rounding SDS waiting area, chaplain stopped and spoke with pt's wife, [name], checking to see how she was doing. Pt's wife said she was doing fine. Chaplain wished her well and left.

## 2020-04-08 ENCOUNTER — Telehealth: Payer: Self-pay

## 2020-04-08 MED ORDER — TELMISARTAN-HCTZ 80-25 MG PO TABS: 1.0000 | ORAL_TABLET | Freq: Every day | ORAL | 2 refills | Status: DC

## 2020-04-08 NOTE — Telephone Encounter (Signed)
Incoming fax from [employer] stating patients plan does not cover Telmisartan/HCTZ  Preferred alternative is Losartan Potassium, Losartan/ HCTZ , Or Valsartan.

## 2020-04-10 NOTE — Telephone Encounter (Signed)
Left voicemail message to call back  

## 2020-04-10 NOTE — Telephone Encounter (Signed)
Spoke to patient. He has already picked up his prescription from hi pharmacy at [facility]. He had no issues.  Closing encounter.   Spoke to [employer] and they do not have this medication on patient's list at this time.

## 2020-04-10 NOTE — Telephone Encounter (Signed)
Patient is returning your call.  

## 2020-04-16 ENCOUNTER — Telehealth: Payer: Self-pay | Admitting: Internal Medicine

## 2020-04-16 DIAGNOSIS — M19042 Primary osteoarthritis, left hand: Secondary | ICD-10-CM | POA: Insufficient documentation

## 2020-04-16 DIAGNOSIS — M19041 Primary osteoarthritis, right hand: Secondary | ICD-10-CM | POA: Insufficient documentation

## 2020-04-16 NOTE — Telephone Encounter (Signed)
Patient had CT CardiacScoring on 12/06/2018 showing aortic atherosclerosis; I have flagged this in the appointment for 04/18/2020 in appointment notes. Other Gaps need , Flu Vaccine    [facility] is asking [place] to do this to close this GAP.  70 Aortic Atherosclerosis Report - Aortic Atherosclerosis is visible evidence that a patient has vascular disease.  The attached is a list of patients in your practice who have had Aortic Atherosclerosis (I70.0) identified on a radiological exam and have an appointment coming up  but this has not been billed out on a medical claim from a PCP or specialist.  We ask that you do so on the next office visit your office has with this patient so that the patient may be appropriately risk stratified. Also remember that vascular disease patients fall into the Quality Metric for needing Statin therapy for cardiovascular disease (SPC.)

## 2020-04-18 ENCOUNTER — Other Ambulatory Visit: Payer: Self-pay

## 2020-04-18 ENCOUNTER — Ambulatory Visit (INDEPENDENT_AMBULATORY_CARE_PROVIDER_SITE_OTHER): Payer: Medicare PPO | Admitting: Internal Medicine

## 2020-04-18 ENCOUNTER — Encounter: Payer: Self-pay | Admitting: Internal Medicine

## 2020-04-18 VITALS — BP 132/68 | HR 59 | Temp 98.4°F | Resp 15 | Ht 66.0 in | Wt 185.6 lb

## 2020-04-18 DIAGNOSIS — R635 Abnormal weight gain: Secondary | ICD-10-CM | POA: Diagnosis not present

## 2020-04-18 DIAGNOSIS — Z0000 Encounter for general adult medical examination without abnormal findings: Secondary | ICD-10-CM

## 2020-04-18 DIAGNOSIS — E782 Mixed hyperlipidemia: Secondary | ICD-10-CM

## 2020-04-18 DIAGNOSIS — K760 Fatty (change of) liver, not elsewhere classified: Secondary | ICD-10-CM

## 2020-04-18 DIAGNOSIS — I10 Essential (primary) hypertension: Secondary | ICD-10-CM

## 2020-04-18 DIAGNOSIS — Z7184 Encounter for health counseling related to travel: Secondary | ICD-10-CM | POA: Diagnosis not present

## 2020-04-18 DIAGNOSIS — R7303 Prediabetes: Secondary | ICD-10-CM

## 2020-04-18 LAB — COMPREHENSIVE METABOLIC PANEL
ALT: 20 U/L (ref 0–53)
AST: 22 U/L (ref 0–37)
Albumin: 4.3 g/dL (ref 3.5–5.2)
Alkaline Phosphatase: 57 U/L (ref 39–117)
BUN: 11 mg/dL (ref 6–23)
CO2: 30 mEq/L (ref 19–32)
Calcium: 9.1 mg/dL (ref 8.4–10.5)
Chloride: 96 mEq/L (ref 96–112)
Creatinine, Ser: 0.85 mg/dL (ref 0.40–1.50)
GFR: 85.53 mL/min (ref >60.00)
Glucose, Bld: 96 mg/dL (ref 70–99)
Potassium: 4.2 mEq/L (ref 3.5–5.1)
Sodium: 133 mEq/L — ABNORMAL LOW (ref 135–145)
Total Bilirubin: 1.6 mg/dL — ABNORMAL HIGH (ref 0.2–1.2)
Total Protein: 6.6 g/dL (ref 6.0–8.3)

## 2020-04-18 LAB — LIPID PANEL
Cholesterol: 110 mg/dL (ref 0–200)
HDL: 45.1 mg/dL (ref >39.00)
LDL Cholesterol: 34 mg/dL (ref 0–99)
NonHDL: 64.59
Total CHOL/HDL Ratio: 2
Triglycerides: 155 mg/dL — ABNORMAL HIGH (ref 0.0–149.0)
VLDL: 31 mg/dL (ref 0.0–40.0)

## 2020-04-18 LAB — HEMOGLOBIN A1C: Hgb A1c MFr Bld: 6 % (ref 4.6–6.5)

## 2020-04-18 LAB — TSH: TSH: 0.73 u[IU]/mL (ref 0.35–4.50)

## 2020-04-18 MED ORDER — CIPROFLOXACIN HCL 500 MG PO TABS: 500.0000 mg | ORAL_TABLET | Freq: Two times a day (BID) | ORAL | 0 refills | Status: DC

## 2020-04-18 MED ORDER — AZITHROMYCIN 250 MG PO TABS: ORAL_TABLET | ORAL | 0 refills | Status: DC

## 2020-04-18 MED ORDER — OSELTAMIVIR PHOSPHATE 75 MG PO CAPS: 75.0000 mg | ORAL_CAPSULE | Freq: Two times a day (BID) | ORAL | 0 refills | Status: DC

## 2020-04-18 NOTE — Progress Notes (Signed)
Patient ID: Mark Empson., male    DOB: [birth year]  Age: 74 y.o. MRN: [id number]  The patient is here for annual preventive examination and management of other chronic and acute problems.  This visit occurred during the SARS-CoV-2 public health emergency.  Safety protocols were in place, including screening questions prior to the visit, additional usage of staff PPE, and extensive cleaning of exam room while observing appropriate contact time as indicated for disinfecting solutions.      The risk factors are reflected in the social history.  The roster of all physicians providing medical care to patient - is listed in the Snapshot section of the chart.  Activities of daily living:  The patient is 100% independent in all ADLs: dressing, toileting, feeding as well as independent mobility  Home safety : The patient has smoke detectors in the home. They wear seatbelts.  There are no firearms at home. There is no violence in the home.   There is no risks for hepatitis, STDs or HIV. There is no   history of blood transfusion. They have no travel history to infectious disease endemic areas of the world.  The patient has seen their dentist in the last six month. They have seen their eye doctor in the last year. They admit to slight hearing difficulty with regard to whispered voices and some television programs.  They have deferred audiologic testing in the last year.  They do not  have excessive sun exposure. Discussed the need for sun protection: hats, long sleeves and use of sunscreen if there is significant sun exposure.   Diet: the importance of a healthy diet is discussed. They do have a healthy diet.  The benefits of regular aerobic exercise were discussed. She walks 4 times per week ,  20 minutes.   Depression screen: there are no signs or vegative symptoms of depression- irritability, change in appetite, anhedonia, sadness/tearfullness.  Cognitive assessment: the patient manages all  their financial and personal affairs and is actively engaged. They could relate day,date,year and events; recalled 2/3 objects at 3 minutes; performed clock-face test normally.  The following portions of the patient's history were reviewed and updated as appropriate: allergies, current medications, past family history, past medical history,  past surgical history, past social history  and problem list.  Visual acuity was not assessed per patient preference since she has regular follow up with her ophthalmologist. Hearing and body mass index were assessed and reviewed.   During the course of the visit the patient was educated and counseled about appropriate screening and preventive services including : fall prevention , diabetes screening, nutrition counseling, colorectal cancer screening, and recommended immunizations.    CC: The primary encounter diagnosis was Encounter for preventive health examination. Diagnoses of Hepatic steatosis, Prediabetes, HYPERTENSION, BENIGN, Weight gain, Travel advice encounter, and Mixed hyperlipidemia were also pertinent to this visit.   1) weight gain and difficulty getting it off.  Has been less active since his knee replacement. Feels that it has slowed him down considerably.  2) glaucoma now diagnosed in both eyes  3) Hypertension: patient checks blood pressure twice weekly at home.  Readings have been for the most part < 140/80 at rest . Patient is following a reduced salt diet and is taking medications as prescribed  History Mark Farley has a past medical history of Arthritis, Bilateral carpal tunnel syndrome, COPD (chronic obstructive pulmonary disease) (HCC), Dysrhythmia, GERD (gastroesophageal reflux disease), Glucose intolerance (impaired glucose tolerance), Heart murmur, History of syncope (2005),  Hyperlipidemia, Hypertension, Iron deficiency anemia, Osteoarthritis of multiple joints, Palpitations (2010), and Pre-diabetes.   He has a past surgical history  that includes Colonoscopy; Tonsillectomy; Appendectomy; Total knee arthroplasty (Right, 03/14/2019); Joint replacement; Total knee arthroplasty (Left, 06/22/2019); and Colonoscopy with propofol (N/A, 01/24/2020).   His family history includes Colon cancer in his brother and paternal grandmother; Coronary artery disease in his mother; Heart attack in his mother; Heart failure in his father.He reports that he has never smoked. He has never used smokeless tobacco. He reports current alcohol use of about 14.0 standard drinks of alcohol per week. He reports that he does not use drugs.  Outpatient Medications Prior to Visit  Medication Sig Dispense Refill  . amoxicillin (AMOXIL) 500 MG tablet Take 4 tablets by mouth (2,000 mg) 30-60 minutes prior to dental procedure. (Patient taking differently: Take 2,000 mg by mouth See admin instructions. Take 4 tablets by mouth (2,000 mg) 30-60 minutes prior to dental procedure.) 10 tablet 0  . aspirin EC 81 MG tablet Take 81 mg by mouth daily. Swallow whole.    . celecoxib (CELEBREX) 100 MG capsule Take 100 mg by mouth daily.    Marland Kitchen. diltiazem (CARDIZEM) 30 MG tablet Take 1 tablet (30 mg total) by mouth 4 (four) times daily as needed. (Patient taking differently: Take 30 mg by mouth 4 (four) times daily as needed (palpitations.). ) 90 tablet 3  . metoprolol succinate (TOPROL-XL) 50 MG 24 hr tablet Take 1 tablet (50 mg total) by mouth daily. (Patient taking differently: Take 50 mg by mouth every evening. ) 90 tablet 3  . omeprazole (PRILOSEC) 20 MG capsule Take 1 capsule (20 mg total) by mouth daily. 90 capsule 3  . rosuvastatin (CRESTOR) 20 MG tablet Take by mouth.    . sildenafil (REVATIO) 20 MG tablet Take 20-60 mg by mouth daily as needed (erectile dysfunction.).     Marland Kitchen. telmisartan-hydrochlorothiazide (MICARDIS HCT) 80-25 MG tablet Take 1 tablet by mouth daily. 90 tablet 2  . timolol (TIMOPTIC) 0.5 % ophthalmic solution 1 drop 2 (two) times daily.    . traMADol (ULTRAM)  50 MG tablet Take 50 mg by mouth 2 (two) times daily as needed.    . valACYclovir (VALTREX) 1000 MG tablet Take 1-2 g by mouth daily as needed (cold sores/fever blisters).     Marland Kitchen. aspirin 81 MG chewable tablet Chew 1 tablet (81 mg total) by mouth daily. (Patient not taking: Reported on 04/18/2020) 30 tablet 0  . clotrimazole (LOTRIMIN) 1 % cream Apply 1 application topically 2 (two) times daily as needed (skin irritation.).  (Patient not taking: Reported on 04/18/2020)    . hydrocortisone cream 0.5 % Apply 1 application topically 2 (two) times daily as needed for itching (skin irritation/rash.). (Patient not taking: Reported on 04/18/2020)    . methocarbamol (ROBAXIN) 500 MG tablet Take 1 tablet (500 mg total) by mouth every 8 (eight) hours as needed for muscle spasms. (Patient not taking: Reported on 04/18/2020) 30 tablet 0  . Omega-3 Fatty Acids (FISH OIL) 1000 MG CAPS Take 1,000 mg by mouth daily. (Patient not taking: Reported on 04/18/2020)    . sulfamethoxazole-trimethoprim (BACTRIM DS) 800-160 MG tablet Take 1 tablet by mouth 2 (two) times daily. (Patient not taking: Reported on 04/18/2020) 14 tablet 0  . tiZANidine (ZANAFLEX) 4 MG tablet Take 1 tablet (4 mg total) by mouth every 8 (eight) hours as needed for muscle spasms. (Patient not taking: Reported on 04/18/2020) 30 tablet 0  . traZODone (DESYREL) 50  MG tablet Take 0.5-1 tablets (25-50 mg total) by mouth at bedtime as needed for sleep. (Patient not taking: Reported on 04/18/2020) 30 tablet 3   No facility-administered medications prior to visit.    Review of Systems   Patient denies headache, fevers, malaise, unintentional weight loss, skin rash, eye pain, sinus congestion and sinus pain, sore throat, dysphagia,  hemoptysis , cough, dyspnea, wheezing, chest pain, palpitations, orthopnea, edema, abdominal pain, nausea, melena, diarrhea, constipation, flank pain, dysuria, hematuria, urinary  Frequency, nocturia, numbness, tingling, seizures,   Focal weakness, Loss of consciousness,  Tremor, insomnia, depression, anxiety, and suicidal ideation.     Objective:  BP 132/68 (BP Location: Left Arm, Patient Position: Sitting, Cuff Size: Normal)   Pulse (!) 59   Temp 98.4 F (36.9 C) (Oral)   Resp 15   Ht 5\' 6"  (1.676 m)   Wt 185 lb 9.6 oz (84.2 kg)   SpO2 97%   BMI 29.96 kg/m   Physical Exam  General appearance: alert, cooperative and appears stated age Ears: normal TM's and external ear canals both ears Throat: lips, mucosa, and tongue normal; teeth and gums normal Neck: no adenopathy, no carotid bruit, supple, symmetrical, trachea midline and thyroid not enlarged, symmetric, no tenderness/mass/nodules Back: symmetric, no curvature. ROM normal. No CVA tenderness. Lungs: clear to auscultation bilaterally Heart: regular rate and rhythm, S1, S2 normal, no murmur, click, rub or gallop Abdomen: soft, non-tender; bowel sounds normal; no masses,  no organomegaly Pulses: 2+ and symmetric Skin: Skin color, texture, turgor normal. No rashes or lesions Lymph nodes: Cervical, supraclavicular, and axillary nodes normal.   Assessment & Plan:   Problem List Items Addressed This Visit      Unprioritized   Travel advice encounter    and wife [name]  Are going on a cruise to the [name].  Prescriptions for tamiful, azithromycin and ciprofloxacin given with instructions on when to use.       Prediabetes    Managed with low GI diet, weight loss and exercise.  a1c is stable .  He is  taking a statin     Lab Results  Component Value Date   HGBA1C 6.0 04/18/2020         Relevant Orders   Hemoglobin A1c (Completed)   Mixed hyperlipidemia    Managed with Crestor for elevated risk for CAD of 33% using the [facility]  Lab Results  Component Value Date   CHOL 110 04/18/2020   HDL 45.10 04/18/2020   LDLCALC 34 04/18/2020   LDLDIRECT 81.0 03/08/2017   TRIG 155.0 (H) 04/18/2020   CHOLHDL 2 04/18/2020         Relevant  Medications   aspirin EC 81 MG tablet   HYPERTENSION, BENIGN    Well controlled on current regimen of telmisartan/hctz 80/25 and metoprolol 50 mg daily  Renal function stable, no changes today.      Relevant Medications   aspirin EC 81 MG tablet   Other Relevant Orders   Comprehensive metabolic panel (Completed)   Hepatic steatosis    , but Previously seen ALT elevation is now resolved.  Serologies for iron overload and other causes were negative.  Ultrasound suggestive of hepatic steatosis.  He is managing his prediabetes and initially lost 23 lbs , over  10% of his body weight since last visit , but has regained 8 lbs due to decreased activity.    Lab Results  Component Value Date   ALT 20 04/18/2020   AST 22  04/18/2020   ALKPHOS 57 04/18/2020   BILITOT 1.6 (H) 04/18/2020         Relevant Orders   Lipid panel (Completed)   Encounter for preventive health examination - Primary    age appropriate education and counseling updated, referrals for preventative services and immunizations addressed, dietary and smoking counseling addressed, most recent labs reviewed.  I have personally reviewed and have noted:  1) the patient's medical and social history 2) The pt's use of alcohol, tobacco, and illicit drugs 3) The patient's current medications and supplements 4) Functional ability including ADL's, fall risk, home safety risk, hearing and visual impairment 5) Diet and physical activities 6) Evidence for depression or mood disorder 7) The patient's height, weight, and BMI have been recorded in the chart  I have made referrals, and provided counseling and education based on review of the above       Other Visit Diagnoses    Weight gain       Relevant Orders   TSH (Completed)      I have discontinued Benay Pillow. "Tony"'s tiZANidine, Fish Oil, clotrimazole, hydrocortisone cream, sulfamethoxazole-trimethoprim, aspirin, methocarbamol, and traZODone. I am also having  him start on azithromycin, oseltamivir, and ciprofloxacin. Additionally, I am having him maintain his omeprazole, diltiazem, metoprolol succinate, celecoxib, valACYclovir, sildenafil, amoxicillin, telmisartan-hydrochlorothiazide, timolol, traMADol, rosuvastatin, and aspirin EC.  Meds ordered this encounter  Medications  . azithromycin (ZITHROMAX) 250 MG tablet    Sig: 2 tablets one day 1,  One tablet daily until gone    Dispense:  6 tablet    Refill:  0  . oseltamivir (TAMIFLU) 75 MG capsule    Sig: Take 1 capsule (75 mg total) by mouth 2 (two) times daily.    Dispense:  10 capsule    Refill:  0  . ciprofloxacin (CIPRO) 500 MG tablet    Sig: Take 1 tablet (500 mg total) by mouth 2 (two) times daily.    Dispense:  20 tablet    Refill:  0    Medications Discontinued During This Encounter  Medication Reason  . aspirin 81 MG chewable tablet   . clotrimazole (LOTRIMIN) 1 % cream   . hydrocortisone cream 0.5 %   . methocarbamol (ROBAXIN) 500 MG tablet   . Omega-3 Fatty Acids (FISH OIL) 1000 MG CAPS   . sulfamethoxazole-trimethoprim (BACTRIM DS) 800-160 MG tablet   . tiZANidine (ZANAFLEX) 4 MG tablet   . traZODone (DESYREL) 50 MG tablet     Follow-up: No follow-ups on file.   Sherlene Shams, MD

## 2020-04-18 NOTE — Patient Instructions (Addendum)
Check to see if you had the Shingrix vaccine at the [place] (2 shot series)  I have sent prescriptions for you and [name] to take on your cruise to [employer]  Here is how you use them:  cipro 250 mg twice daily for 3 days for NANCY'S UTI cipro 500 mg twice daily for 7 days for  Diarrhea  (or for YOUR UTI,  But you will need a 2nd week of abx to finish treatment for a man's UTI)  Azithromycin plus tamiflu for flu like illness    Health Maintenance After Age 23 After age 89, you are at a higher risk for certain long-term diseases and infections as well as injuries from falls. Falls are a major cause of broken bones and head injuries in people who are older than age 67. Getting regular preventive care can help to keep you healthy and well. Preventive care includes getting regular testing and making lifestyle changes as recommended by your health care provider. Talk with your health care provider about:  Which screenings and tests you should have. A screening is a test that checks for a disease when you have no symptoms.  A diet and exercise plan that is right for you. What should I know about screenings and tests to prevent falls? Screening and testing are the best ways to find a health problem early. Early diagnosis and treatment give you the best chance of managing medical conditions that are common after age 32. Certain conditions and lifestyle choices may make you more likely to have a fall. Your health care provider may recommend:  Regular vision checks. Poor vision and conditions such as cataracts can make you more likely to have a fall. If you wear glasses, make sure to get your prescription updated if your vision changes.  Medicine review. Work with your health care provider to regularly review all of the medicines you are taking, including over-the-counter medicines. Ask your health care provider about any side effects that may make you more likely to have a fall. Tell your health care  provider if any medicines that you take make you feel dizzy or sleepy.  Osteoporosis screening. Osteoporosis is a condition that causes the bones to get weaker. This can make the bones weak and cause them to break more easily.  Blood pressure screening. Blood pressure changes and medicines to control blood pressure can make you feel dizzy.  Strength and balance checks. Your health care provider may recommend certain tests to check your strength and balance while standing, walking, or changing positions.  Foot health exam. Foot pain and numbness, as well as not wearing proper footwear, can make you more likely to have a fall.  Depression screening. You may be more likely to have a fall if you have a fear of falling, feel emotionally low, or feel unable to do activities that you used to do.  Alcohol use screening. Using too much alcohol can affect your balance and may make you more likely to have a fall. What actions can I take to lower my risk of falls? General instructions  Talk with your health care provider about your risks for falling. Tell your health care provider if: ? You fall. Be sure to tell your health care provider about all falls, even ones that seem minor. ? You feel dizzy, sleepy, or off-balance.  Take over-the-counter and prescription medicines only as told by your health care provider. These include any supplements.  Eat a healthy diet and maintain a healthy weight.  A healthy diet includes low-fat dairy products, low-fat (lean) meats, and fiber from whole grains, beans, and lots of fruits and vegetables. Home safety  Remove any tripping hazards, such as rugs, cords, and clutter.  Install safety equipment such as grab bars in bathrooms and safety rails on stairs.  Keep rooms and walkways well-lit. Activity   Follow a regular exercise program to stay fit. This will help you maintain your balance. Ask your health care provider what types of exercise are appropriate for  you.  If you need a cane or walker, use it as recommended by your health care provider.  Wear supportive shoes that have nonskid soles. Lifestyle  Do not drink alcohol if your health care provider tells you not to drink.  If you drink alcohol, limit how much you have: ? 0-1 drink a day for women. ? 0-2 drinks a day for men.  Be aware of how much alcohol is in your drink. In the [place] one drink equals one typical bottle of beer (12 oz), one-half glass of wine (5 oz), or one shot of hard liquor (1 oz).  Do not use any products that contain nicotine or tobacco, such as cigarettes and e-cigarettes. If you need help quitting, ask your health care provider. Summary  Having a healthy lifestyle and getting preventive care can help to protect your health and wellness after age 20.  Screening and testing are the best way to find a health problem early and help you avoid having a fall. Early diagnosis and treatment give you the best chance for managing medical conditions that are more common for people who are older than age 41.  Falls are a major cause of broken bones and head injuries in people who are older than age 42. Take precautions to prevent a fall at home.  Work with your health care provider to learn what changes you can make to improve your health and wellness and to prevent falls. This information is not intended to replace advice given to you by your health care provider. Make sure you discuss any questions you have with your health care provider. Document Revised: 09/08/2018 Document Reviewed: 03/31/2017 Elsevier Patient Education  2020 [employer].

## 2020-04-20 DIAGNOSIS — Z7184 Encounter for health counseling related to travel: Secondary | ICD-10-CM | POA: Insufficient documentation

## 2020-04-20 NOTE — Assessment & Plan Note (Signed)
,   but Previously seen ALT elevation is now resolved.  Serologies for iron overload and other causes were negative.  Ultrasound suggestive of hepatic steatosis.  He is managing his prediabetes and initially lost 23 lbs , over  10% of his body weight since last visit , but has regained 8 lbs due to decreased activity.    Lab Results  Component Value Date   ALT 20 04/18/2020   AST 22 04/18/2020   ALKPHOS 57 04/18/2020   BILITOT 1.6 (H) 04/18/2020

## 2020-04-20 NOTE — Assessment & Plan Note (Addendum)
Managed with low GI diet, weight loss and exercise.  a1c is stable .  He is  taking a statin     Lab Results  Component Value Date   HGBA1C 6.0 04/18/2020

## 2020-04-20 NOTE — Assessment & Plan Note (Addendum)
Well controlled on current regimen of telmisartan/hctz 80/25 and metoprolol 50 mg daily  Renal function stable, no changes today.

## 2020-04-20 NOTE — Assessment & Plan Note (Signed)

## 2020-04-20 NOTE — Assessment & Plan Note (Signed)
Managed with Crestor for elevated risk for CAD of 33% using the [facility]  Lab Results  Component Value Date   CHOL 110 04/18/2020   HDL 45.10 04/18/2020   LDLCALC 34 04/18/2020   LDLDIRECT 81.0 03/08/2017   TRIG 155.0 (H) 04/18/2020   CHOLHDL 2 04/18/2020

## 2020-04-20 NOTE — Assessment & Plan Note (Signed)
Mark Farley and wife Mark Farley  Are going on a cruise to the [place].  Prescriptions for tamiful, azithromycin and ciprofloxacin given with instructions on when to use.

## 2020-04-22 ENCOUNTER — Other Ambulatory Visit: Payer: Self-pay | Admitting: Internal Medicine

## 2020-04-22 DIAGNOSIS — K760 Fatty (change of) liver, not elsewhere classified: Secondary | ICD-10-CM

## 2020-04-24 DIAGNOSIS — D2271 Melanocytic nevi of right lower limb, including hip: Secondary | ICD-10-CM | POA: Diagnosis not present

## 2020-04-24 DIAGNOSIS — B078 Other viral warts: Secondary | ICD-10-CM | POA: Diagnosis not present

## 2020-04-24 DIAGNOSIS — L304 Erythema intertrigo: Secondary | ICD-10-CM | POA: Diagnosis not present

## 2020-04-24 DIAGNOSIS — D2261 Melanocytic nevi of right upper limb, including shoulder: Secondary | ICD-10-CM | POA: Diagnosis not present

## 2020-04-24 DIAGNOSIS — D225 Melanocytic nevi of trunk: Secondary | ICD-10-CM | POA: Diagnosis not present

## 2020-04-24 DIAGNOSIS — D2262 Melanocytic nevi of left upper limb, including shoulder: Secondary | ICD-10-CM | POA: Diagnosis not present

## 2020-05-13 ENCOUNTER — Ambulatory Visit: Payer: Self-pay

## 2020-05-13 ENCOUNTER — Ambulatory Visit (INDEPENDENT_AMBULATORY_CARE_PROVIDER_SITE_OTHER): Payer: Medicare PPO | Admitting: Orthopedic Surgery

## 2020-05-13 ENCOUNTER — Other Ambulatory Visit: Payer: Self-pay

## 2020-05-13 DIAGNOSIS — Z96653 Presence of artificial knee joint, bilateral: Secondary | ICD-10-CM | POA: Diagnosis not present

## 2020-05-26 ENCOUNTER — Encounter: Payer: Self-pay | Admitting: Orthopedic Surgery

## 2020-05-26 NOTE — Progress Notes (Signed)
Office Visit Note   Patient: Mark Farley.           Date of Birth: 1946-03-05           MRN: [id number] Visit Date: 05/13/2020 Requested by: Sherlene Shams, MD [address] [place],  [place] [address] PCP: Sherlene Shams, MD  Subjective: Chief Complaint  Patient presents with  . Left Knee - Pain  . Right Knee - Pain    HPI: Mark Farley. is a 74 y.o. male who presents to the office complaining of bilateral knee swelling.  Patient has history of left total knee arthroplasty on 06/22/2019 as well as right total knee arthroplasty on 03/14/2019.  He has overall been doing well since the procedures.  He does note occasional swelling of the bilateral knees.  He denies any injury.  Denies any fevers, chills, night sweats, malaise.  He is concerned about the mechanical feeling to his knees following replacement. .                ROS: All systems reviewed are negative as they relate to the chief complaint within the history of present illness.  Patient denies fevers or chills.  Assessment & Plan: Visit Diagnoses:  1. History of bilateral knee arthroplasty     Plan: Patient is a 74 year old male who presents for evaluation of bilateral knee pain following bilateral knee replacement in late 2020 and early 2021.  He is doing well overall and denies any recent injury.  He has excellent range of motion excellent strength of his knees on exam today and there are no signs that are concerning for infection.  Radiographs of the bilateral knees were taken today and show no sign of loosening or fracture.  Today's radiographs are comparable to the last set of radiographs for each knee.  Overall he seems to be doing well.  Assured him that the mechanical feel that he feels when walking around sometimes is about par for the course for knee replacement.  He understands and plans to follow-up as needed.  Discussed the red flags for a total joint replacement and encourage patient to  receive antibiotics from our office prior to any dental work.  Follow-Up Instructions: No follow-ups on file.   Orders:  Orders Placed This Encounter  Procedures  . XR Knee 1-2 Views Right  . XR Knee 1-2 Views Left   No orders of the defined types were placed in this encounter.     Procedures: No procedures performed   Clinical Data: No additional findings.  Objective: Vital Signs: There were no vitals taken for this visit.  Physical Exam:  Constitutional: Patient appears well-developed HEENT:  Head: Normocephalic Eyes:EOM are normal Neck: Normal range of motion Cardiovascular: Normal rate Pulmonary/chest: Effort normal Neurologic: Patient is alert Skin: Skin is warm Psychiatric: Patient has normal mood and affect  Ortho Exam: Ortho exam demonstrates bilateral knees with well-healed incision from prior knee replacement.  Excellent range of motion with 0 degrees of extension bilaterally and easy flexion of the right knee to about 120 degrees and left knee to about 110 degrees.  No calf tenderness.  Negative Homans' sign.  No significant effusion of the bilateral knees.  No redness noted along the incision.  No significant warmth of the knees.  No gross mid flexion or extension instability.  Excellent strength with patient easily able to perform multiple straight leg raises with both legs.  Specialty Comments:  No specialty comments  available.  Imaging: No results found.   PMFS History: Patient Active Problem List   Diagnosis Date Noted  . Travel advice encounter 04/20/2020  . Arthritis of both hands 04/16/2020  . History of colonic polyps   . Arthritis of left knee 06/22/2019  . S/P total knee arthroplasty, left 03/14/2019  . Educated about COVID-19 virus infection 09/25/2018  . Erectile dysfunction 09/25/2018  . Knee torn cartilage, left 09/16/2017  . Intention tremor 03/09/2017  . Tingling of left upper extremity 03/09/2017  . Runner's knee, right 03/09/2017   . Hepatic steatosis 03/09/2017  . Hearing loss due to cerumen impaction 03/08/2017  . Encounter for preventive health examination 05/26/2015  . SVT (supraventricular tachycardia) (HCC) 02/07/2015  . History of syncope 03/06/2014  . PVC's (premature ventricular contractions) 03/06/2014  . Arthritis of right wrist 06/01/2013  . Osteoarthritis 02/08/2012  . Prediabetes 08/06/2011  . Overweight (BMI 25.0-29.9) 02/06/2011  . Family history of colon cancer 02/05/2011  . Mixed hyperlipidemia 08/14/2008  . HYPERTENSION, BENIGN 08/14/2008   Past Medical History:  Diagnosis Date  . Arthritis    ? Gout  . Bilateral carpal tunnel syndrome    managed with nocturnal braces x 3 years  . COPD (chronic obstructive pulmonary disease) (HCC)   . Dysrhythmia    Tachycardia, PVCs, SVTs  . GERD (gastroesophageal reflux disease)   . Glucose intolerance (impaired glucose tolerance)   . Heart murmur    as a child  . History of syncope 2005   Unexplained, myoview negative  . Hyperlipidemia   . Hypertension   . Iron deficiency anemia   . Osteoarthritis of multiple joints   . Palpitations 2010   Life Watch with single PVC  . Pre-diabetes     Family History  Problem Relation Age of Onset  . Coronary artery disease Mother   . Heart attack Mother        MI  . Heart failure Father        CHF  . Colon cancer Brother   . Colon cancer Paternal Grandmother     Past Surgical History:  Procedure Laterality Date  . APPENDECTOMY     1957  . COLONOSCOPY    . COLONOSCOPY WITH PROPOFOL N/A 01/24/2020   Procedure: COLONOSCOPY WITH PROPOFOL;  Surgeon: Toney Reil, MD;  Location: [facility] ENDOSCOPY;  Service: Gastroenterology;  Laterality: N/A;  . JOINT REPLACEMENT    . TONSILLECTOMY     1952  . TOTAL KNEE ARTHROPLASTY Right 03/14/2019   Procedure: RIGHT TOTAL KNEE ARTHROPLASTY;  Surgeon: Cammy Copa, MD;  Location: [facility] OR;  Service: Orthopedics;  Laterality: Right;  . TOTAL KNEE ARTHROPLASTY  Left 06/22/2019   Procedure: LEFT TOTAL KNEE ARTHROPLASTY;  Surgeon: Cammy Copa, MD;  Location: [facility] OR;  Service: Orthopedics;  Laterality: Left;   Social History   Occupational History  . Not on file  Tobacco Use  . Smoking status: Never Smoker  . Smokeless tobacco: Never Used  Vaping Use  . Vaping Use: Never used  Substance and Sexual Activity  . Alcohol use: Yes    Alcohol/week: 14.0 standard drinks    Types: 14 Standard drinks or equivalent per week    Comment: 2 glasses of wine a day  . Drug use: No  . Sexual activity: Yes

## 2020-06-23 ENCOUNTER — Other Ambulatory Visit: Payer: Self-pay | Admitting: Internal Medicine

## 2020-06-24 ENCOUNTER — Other Ambulatory Visit (INDEPENDENT_AMBULATORY_CARE_PROVIDER_SITE_OTHER): Payer: Medicare PPO

## 2020-06-24 ENCOUNTER — Other Ambulatory Visit: Payer: Self-pay

## 2020-06-24 ENCOUNTER — Telehealth: Payer: Self-pay | Admitting: Internal Medicine

## 2020-06-24 DIAGNOSIS — K760 Fatty (change of) liver, not elsewhere classified: Secondary | ICD-10-CM

## 2020-06-24 LAB — HEPATIC FUNCTION PANEL
ALT: 22 U/L (ref 0–53)
AST: 21 U/L (ref 0–37)
Albumin: 4.6 g/dL (ref 3.5–5.2)
Alkaline Phosphatase: 63 U/L (ref 39–117)
Bilirubin, Direct: 0.3 mg/dL (ref 0.0–0.3)
Total Bilirubin: 1.5 mg/dL — ABNORMAL HIGH (ref 0.2–1.2)
Total Protein: 6.9 g/dL (ref 6.0–8.3)

## 2020-06-24 MED ORDER — COVID-19 HOME COLLECTION TEST VI KIT: 1.0000 "application " | PACK | 1 refills | Status: DC | PRN

## 2020-06-24 MED ORDER — COVID-19 HOME COLLECTION TEST VI KIT: PACK | 1 refills | Status: DC

## 2020-06-24 NOTE — Telephone Encounter (Signed)
Thank you :)

## 2020-06-24 NOTE — Addendum Note (Signed)
Addended by: Sherlene Shams on: 06/24/2020 12:51 PM   Modules accepted: Orders

## 2020-06-24 NOTE — Telephone Encounter (Signed)
I have printed the order for Home  Covid testing order number [id number] placed in quick sign.

## 2020-06-26 NOTE — Progress Notes (Signed)
Your liver enzymes (AST and ALT)  are normal and your total bilirubin is stable. Please continue your current medications  Regards,   Duncan Dull, MD

## 2020-09-17 NOTE — Progress Notes (Signed)
Evaluation Performed:  Follow-up visit  Date:  09/18/2020   ID:  Mark Pillow., DOB 1946/02/04, MRN [id number]  Patient Location:  [address] [place] [address]   Provider location:   [facility], [place] office  PCP:  Mark Shams, MD  Cardiologist:  Mark Farley [facility]   Chief Complaint  Patient presents with  . 12 month follow up     Patient c/o blood pressure fluctuating with having a syncopal spell on July 24, 2020. Medications reviewed by the patient verbally.     History of Present Illness:    Mark Coryell. is a 75 y.o. male past medical history of HTN,  hyperlipidemia previous syncope, negative Myoview in 2005. h/o palpitations and had a Life Watch monitor for several weeks but no events arthritis,  anemia from iron deficiency Previous Holter monitor showing runs of SVT, rate up to 150 bpm Elevated LFTS on crestor 10 He presents today for follow-up of his palpitations, hypertension  LOV 09/2019 At that time had 30 pound weight loss through dietary change Had knee surgery On Crestor 10 Was on high-dose Micardis HCTZ with metoprolol succinate  High blood pressure at home at times 120 to 140s, rare 150-160 Rare 100 systolic Syncope 5/99/35,TSVX heard him fall, got up too quickly Was working on the ground, got up quickly Stays hydrated  One other dizzy spell, turned around quick  Going to [place] Next week Going with his son, does not annual trip  CT coronary calcium scoring July 2020, reviewed Score 638, 73rd percentile Aorta with mild atherosclerosis  EKG personally reviewed by myself on todays visit Shows normal sinus rhythm rate 59 bpm no significant ST-T wave changes  Orthostatics negative on today's visit  Lab Results  Component Value Date   CHOL 110 04/18/2020   HDL 45.10 04/18/2020   LDLCALC 34 04/18/2020   TRIG 155.0 (H) 04/18/2020    Past medical history reviewed severe series  of tachycardia episodes day after Thanksgiving 2016 Reported he was driving in a car back from a trip, tachycardia seem to happen every 5 minutes. He took several diltiazem, possibly up to 3 or 4 and eventually symptoms seem to improve.    Prior CV studies:   The following studies were reviewed today:  Holter monitor showing frequent episodes of SVT, short-lived total cholesterol less than 150   Past Medical History:  Diagnosis Date  . Arthritis    ? Gout  . Bilateral carpal tunnel syndrome    managed with nocturnal braces x 3 years  . COPD (chronic obstructive pulmonary disease) (HCC)   . Dysrhythmia    Tachycardia, PVCs, SVTs  . GERD (gastroesophageal reflux disease)   . Glucose intolerance (impaired glucose tolerance)   . Heart murmur    as a child  . History of syncope 2005   Unexplained, myoview negative  . Hyperlipidemia   . Hypertension   . Iron deficiency anemia   . Osteoarthritis of multiple joints   . Palpitations 2010   Life Watch with single PVC  . Pre-diabetes    Past Surgical History:  Procedure Laterality Date  . APPENDECTOMY     1957  . COLONOSCOPY    . COLONOSCOPY WITH PROPOFOL N/A 01/24/2020   Procedure: COLONOSCOPY WITH PROPOFOL;  Surgeon: Toney Reil, MD;  Location: [facility] ENDOSCOPY;  Service: Gastroenterology;  Laterality: N/A;  . JOINT REPLACEMENT    . TONSILLECTOMY  1952  . TOTAL KNEE ARTHROPLASTY Right 03/14/2019   Procedure: RIGHT TOTAL KNEE ARTHROPLASTY;  Surgeon: Cammy Copa, MD;  Location: [facility] OR;  Service: Orthopedics;  Laterality: Right;  . TOTAL KNEE ARTHROPLASTY Left 06/22/2019   Procedure: LEFT TOTAL KNEE ARTHROPLASTY;  Surgeon: Cammy Copa, MD;  Location: [facility] OR;  Service: Orthopedics;  Laterality: Left;     Current Meds  Medication Sig  . amoxicillin (AMOXIL) 500 MG tablet Take 4 tablets by mouth (2,000 mg) 30-60 minutes prior to dental procedure.  Marland Kitchen aspirin EC 81 MG tablet Take 81 mg by mouth daily.  Swallow whole.  . celecoxib (CELEBREX) 100 MG capsule Take 100 mg by mouth daily.  Marland Kitchen diltiazem (CARDIZEM) 30 MG tablet Take 1 tablet (30 mg total) by mouth 4 (four) times daily as needed.  . metoprolol succinate (TOPROL-XL) 50 MG 24 hr tablet Take 1 tablet (50 mg total) by mouth daily.  Marland Kitchen omeprazole (PRILOSEC) 20 MG capsule Take 1 capsule (20 mg total) by mouth daily.  . rosuvastatin (CRESTOR) 20 MG tablet Take by mouth.  . sildenafil (REVATIO) 20 MG tablet Take 20-60 mg by mouth daily as needed (erectile dysfunction.).   Marland Kitchen telmisartan-hydrochlorothiazide (MICARDIS HCT) 80-25 MG tablet Take 1 tablet by mouth daily.  . timolol (TIMOPTIC) 0.5 % ophthalmic solution 1 drop 2 (two) times daily.  . traMADol (ULTRAM) 50 MG tablet Take 50 mg by mouth 2 (two) times daily as needed.  . valACYclovir (VALTREX) 1000 MG tablet Take 1-2 g by mouth daily as needed (cold sores/fever blisters).      Allergies:   Bee venom   Social History   Tobacco Use  . Smoking status: Never Smoker  . Smokeless tobacco: Never Used  Vaping Use  . Vaping Use: Never used  Substance Use Topics  . Alcohol use: Yes    Alcohol/week: 14.0 standard drinks    Types: 14 Standard drinks or equivalent per week    Comment: 2 glasses of wine a day  . Drug use: No     Family Hx: The patient's family history includes Colon cancer in his brother and paternal grandmother; Coronary artery disease in his mother; Heart attack in his mother; Heart failure in his father.  ROS:   Please see the history of present illness.    Review of Systems  Constitutional: Positive for weight loss.  Respiratory: Negative.   Cardiovascular: Negative.   Gastrointestinal: Negative.   Musculoskeletal: Positive for joint pain.  Neurological: Negative.   Psychiatric/Behavioral: Negative.   All other systems reviewed and are negative.    Labs/Other Tests and Data Reviewed:    Recent Labs: 01/04/2020: Hemoglobin 13.7; Platelets 185 04/18/2020: BUN  11; Creatinine, Ser 0.85; Potassium 4.2; Sodium 133; TSH 0.73 06/24/2020: ALT 22   Recent Lipid Panel Lab Results  Component Value Date/Time   CHOL 110 04/18/2020 09:08 AM   TRIG 155.0 (H) 04/18/2020 09:08 AM   TRIG 108 05/01/2009 12:00 AM   HDL 45.10 04/18/2020 09:08 AM   CHOLHDL 2 04/18/2020 09:08 AM   LDLCALC 34 04/18/2020 09:08 AM   LDLDIRECT 81.0 03/08/2017 10:06 AM    Wt Readings from Last 3 Encounters:  09/18/20 191 lb 4 oz (86.8 kg)  04/18/20 185 lb 9.6 oz (84.2 kg)  01/24/20 173 lb 4.8 oz (78.6 kg)     Exam:    BP (!) 160/90 (BP Location: Left Arm, Patient Position: Sitting, Cuff Size: Normal)   Pulse (!) 59   Ht 5' 6.5" (1.689  m)   Wt 191 lb 4 oz (86.8 kg)   SpO2 98%   BMI 30.41 kg/m  Constitutional:  oriented to person, place, and time. No distress.  HENT:  Head: Grossly normal Eyes:  no discharge. No scleral icterus.  Neck: No JVD, no carotid bruits  Cardiovascular: Regular rate and rhythm, no murmurs appreciated Pulmonary/Chest: Clear to auscultation bilaterally, no wheezes or rails Abdominal: Soft.  no distension.  no tenderness.  Musculoskeletal: Normal range of motion Neurological:  normal muscle tone. Coordination normal. No atrophy Skin: Skin warm and dry Psychiatric: normal affect, pleasant   ASSESSMENT & PLAN:    SVT (supraventricular tachycardia) (HCC) No significant breakthrough arrhythmia, takes diltiazem 30 mg pills as needed  HYPERTENSION, BENIGN Blood pressure is well controlled on today's visit. No changes made to the medications.  PVC's (premature ventricular contractions) Denies having ectopy, previously seen on monitor, asymptomatic  Mixed hyperlipidemia Prior transaminitis, seen by GI, has had weight loss since then Tolerating rosuvastatin  CAD with stable angina Coronary calcification score 638 on scan in 2020 Cholesterol at goal, no anginal symptoms No further ischemic work-up  History of syncope Recent episode of  syncope, likely orthostatic in nature Occurred while standing quickly from a supine position Difficult situation as we need his metoprolol for rhythm control, though bradycardic (asymptomatic otherwise) Has rare episodes, recommend he stay hydrated, stand slowly from a supine position    Total encounter time more than 25 minutes  Greater than 50% was spent in counseling and coordination of care with the patient  Signed, Julien Nordmann, MD  09/18/2020 10:12 AM    [facility] [address], [place], [place] [address]

## 2020-09-18 ENCOUNTER — Encounter: Payer: Self-pay | Admitting: Cardiovascular Disease

## 2020-09-18 ENCOUNTER — Other Ambulatory Visit: Payer: Self-pay

## 2020-09-18 ENCOUNTER — Ambulatory Visit (INDEPENDENT_AMBULATORY_CARE_PROVIDER_SITE_OTHER): Payer: Medicare PPO | Admitting: Cardiovascular Disease

## 2020-09-18 VITALS — BP 160/90 | HR 59 | Ht 66.5 in | Wt 191.2 lb

## 2020-09-18 DIAGNOSIS — E782 Mixed hyperlipidemia: Secondary | ICD-10-CM | POA: Diagnosis not present

## 2020-09-18 DIAGNOSIS — I10 Essential (primary) hypertension: Secondary | ICD-10-CM | POA: Diagnosis not present

## 2020-09-18 DIAGNOSIS — I471 Supraventricular tachycardia: Secondary | ICD-10-CM

## 2020-09-18 MED ORDER — HYDRALAZINE HCL 25 MG PO TABS: 25.0000 mg | ORAL_TABLET | Freq: Three times a day (TID) | ORAL | 3 refills | Status: DC | PRN

## 2020-09-18 NOTE — Patient Instructions (Addendum)
Medication Instructions:  For high blood pressure >150 (sustained) Take hydralazine 25 mg as needed   If you need a refill on your cardiac medications before your next appointment, please call your pharmacy.    Lab work: No new labs needed   If you have labs (blood work) drawn today and your tests are completely normal, you will receive your results only by: Marland Kitchen MyChart Message (if you have MyChart) OR . A paper copy in the mail If you have any lab test that is abnormal or we need to change your treatment, we will call you to review the results.   Testing/Procedures: No new testing needed   Follow-Up: At [facility], you and your health needs are our priority.  As part of our continuing mission to provide you with exceptional heart care, we have created designated Provider Care Teams.  These Care Teams include your primary Cardiologist (physician) and Advanced Practice Providers (APPs -  Physician Assistants and Nurse Practitioners) who all work together to provide you with the care you need, when you need it.  . You will need a follow up appointment in 6 months  . Providers on your designated Care Team:   . Nicolasa Ducking, NP . Eula Listen, PA-C . Marisue Ivan, PA-C  Any Other Special Instructions Will Be Listed Below (If Applicable).  COVID-19 Vaccine Information can be found at: [email or web address] For questions related to vaccine distribution or appointments, please email [email or web address] or call [phone].

## 2020-10-17 ENCOUNTER — Telehealth: Payer: Self-pay | Admitting: Internal Medicine

## 2020-10-17 NOTE — Telephone Encounter (Signed)
Left message for patient to call back and schedule Medicare Annual Wellness Visit (AWV) in office.   If not able to come in office, please offer to do virtually or by telephone.   Last AWV:03/10/2018   Please schedule at anytime with Nurse Health Advisor.

## 2020-10-31 ENCOUNTER — Other Ambulatory Visit: Payer: Self-pay

## 2020-10-31 ENCOUNTER — Other Ambulatory Visit: Payer: Self-pay | Admitting: Internal Medicine

## 2020-10-31 MED ORDER — SILDENAFIL CITRATE 20 MG PO TABS: 20.0000 mg | ORAL_TABLET | Freq: Every day | ORAL | 2 refills | Status: DC | PRN

## 2020-11-04 ENCOUNTER — Other Ambulatory Visit: Payer: Self-pay

## 2020-11-04 ENCOUNTER — Other Ambulatory Visit: Payer: Self-pay | Admitting: Internal Medicine

## 2020-11-04 ENCOUNTER — Telehealth: Payer: Self-pay | Admitting: Internal Medicine

## 2020-11-04 MED ORDER — SILDENAFIL CITRATE 50 MG PO TABS: 75.0000 mg | ORAL_TABLET | Freq: Every day | ORAL | 0 refills | Status: DC | PRN

## 2020-11-04 MED ORDER — TELMISARTAN-HCTZ 80-25 MG PO TABS: 1.0000 | ORAL_TABLET | Freq: Every day | ORAL | 2 refills | Status: DC

## 2020-11-04 MED ORDER — TELMISARTAN-HCTZ 80-25 MG PO TABS: 1.0000 | ORAL_TABLET | Freq: Every day | ORAL | 3 refills | Status: DC

## 2020-11-04 NOTE — Telephone Encounter (Signed)
Pt's wife is aware of the dose and direction change.

## 2020-11-04 NOTE — Telephone Encounter (Signed)
Rx called in to advise that they only have the 25 50 and 100mg  doses of sildenafil (REVATIO) 20 MG tablet available at their location. They do not have the 20 mg available. Would like to see what Dr.Tullo recommends.

## 2020-11-04 NOTE — Telephone Encounter (Signed)
Medication refilled and sent in to pharmacy per pt's request   

## 2020-11-04 NOTE — Telephone Encounter (Signed)
I will send in the 50 mg generic viagra prescription to [facility],  Directions  1.5 tablet daily prn ED

## 2021-02-05 ENCOUNTER — Telehealth: Payer: Self-pay | Admitting: Internal Medicine

## 2021-02-05 NOTE — Telephone Encounter (Signed)
Patients wife is coming in to see how to get paperwork for a living will as well as papers that list the name of the person that can speak for you in medical emergencies.Please advise.

## 2021-02-06 NOTE — Telephone Encounter (Signed)
Pt's wife is aware that she can come by the office and pick up the advance directive packet.

## 2021-02-20 IMAGING — CT CT HEART SCORING
2 series · 16 of 20 positions shown, 18 images · non-contrast
Comparison: None.
COMPARISON: None.

Addendum:
EXAM:
OVER-READ INTERPRETATION  CT CHEST

The following report is an over-read performed by radiologist Dr.
Koniba Chibani [REDACTED] on 12/06/2018. This
over-read does not include interpretation of cardiac or coronary
anatomy or pathology. The coronary calcium score/coronary CTA
interpretation by the cardiologist is attached.
CLINICAL DATA: Risk stratification
Coronary Calcium Score
TECHNIQUE: The patient was scanned on a Siemens Force scanner. Axial
non-contrast 3 mm slices were carried out through the heart. The
data set was analyzed on a dedicated work station and scored using
the Agatson method.

[Series 2: casc 3.0 i36f 2 bestdiast 69 % · axial · 0.42mm/px · z∈[-229,-124]mm · 8 of 46 slices shown, 10 images]
[im 6/46  vessel]
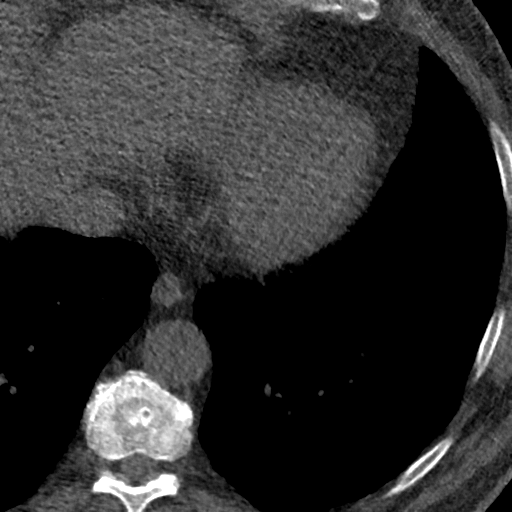
[im 6/46  lung]
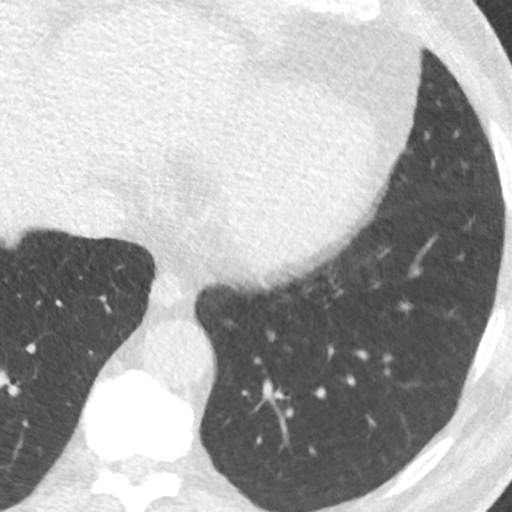
[im 11/46  vessel]
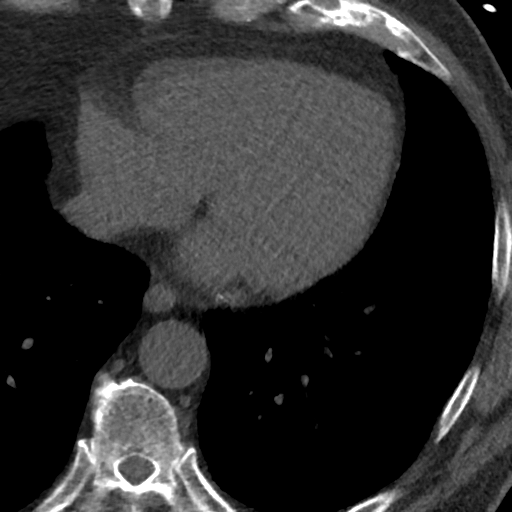
[im 16/46  vessel]
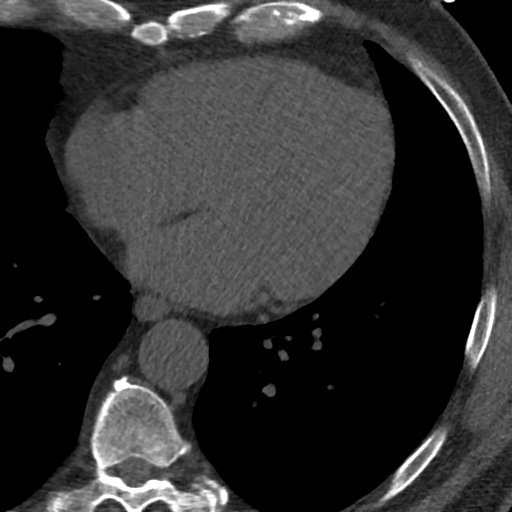
[im 21/46  vessel]
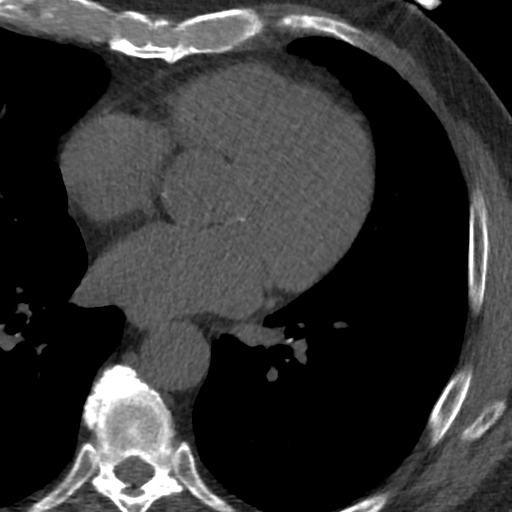
[im 26/46  vessel]
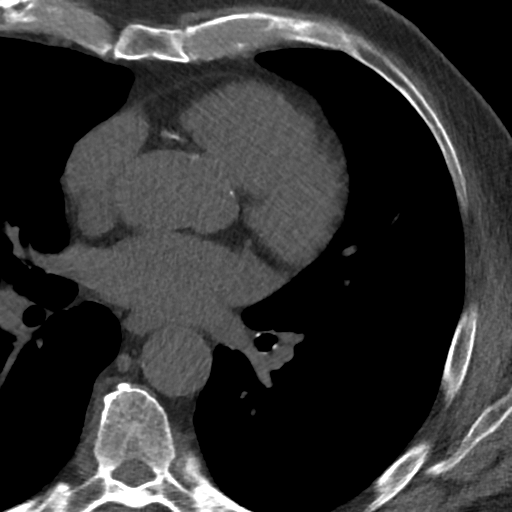
[im 26/46  lung]
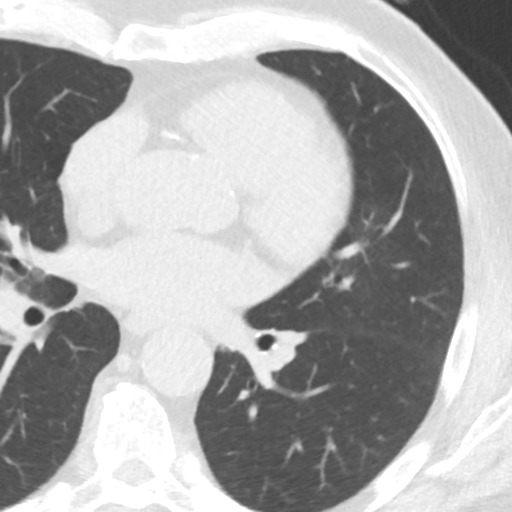
[im 31/46  vessel]
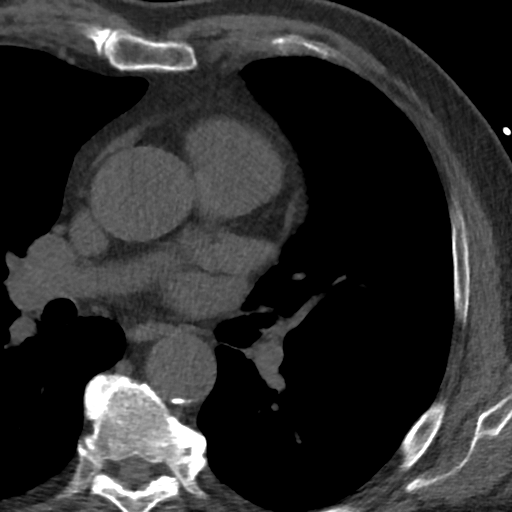
[im 36/46  vessel]
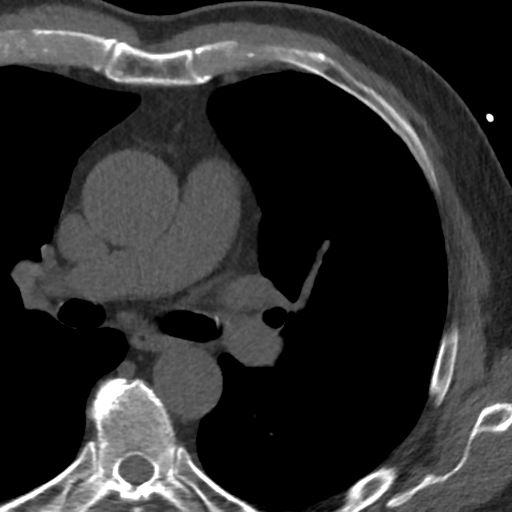
[im 41/46  vessel]
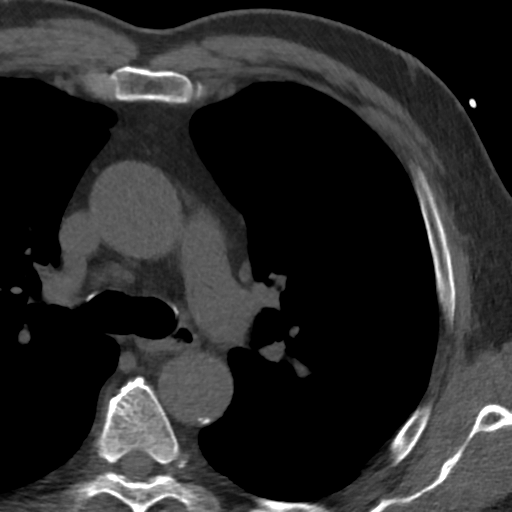

[Series 4: lung st 70 % · axial · 0.77mm/px · z∈[-232,-126]mm · 8 of 47 slices shown]
[im 6/47  lung]
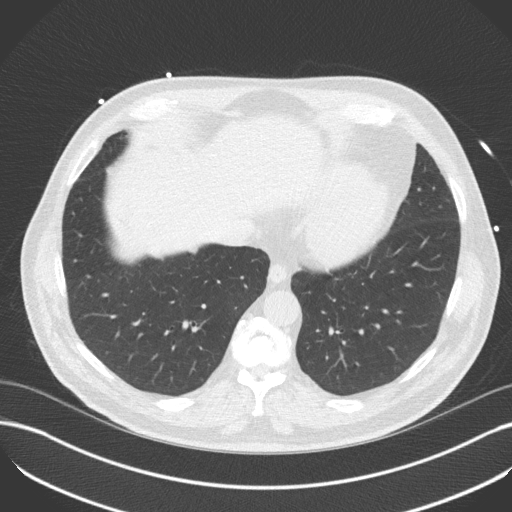
[im 11/47  lung]
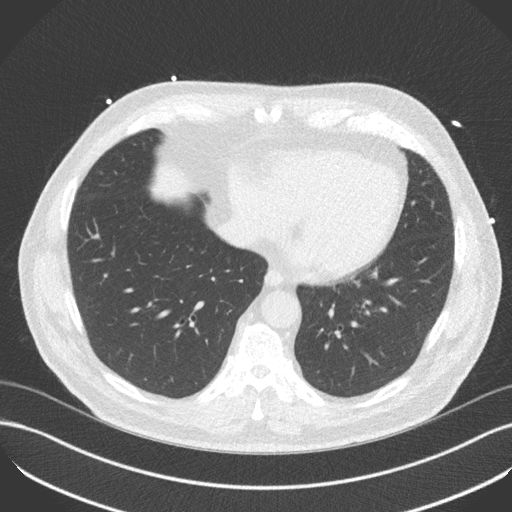
[im 16/47  lung]
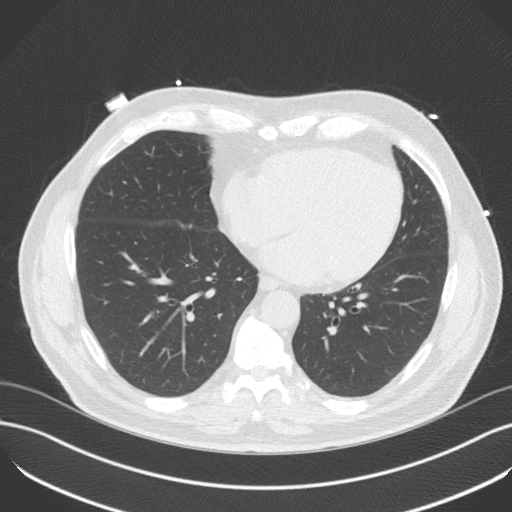
[im 21/47  lung]
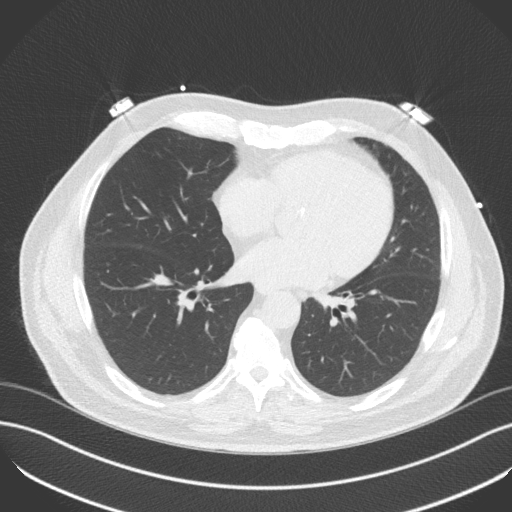
[im 26/47  lung]
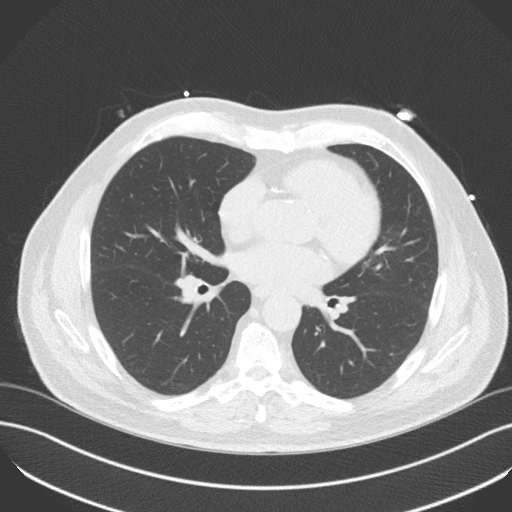
[im 31/47  lung]
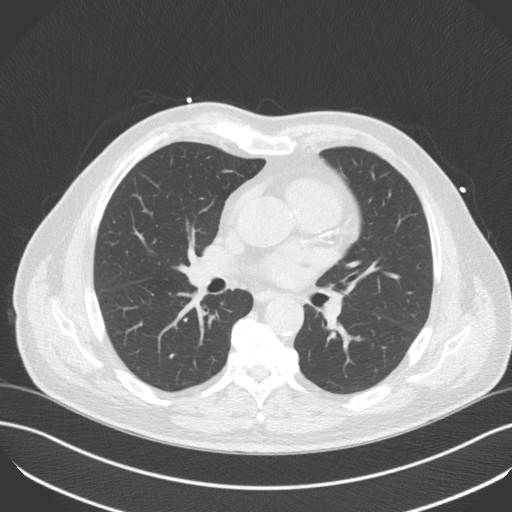
[im 36/47  lung]
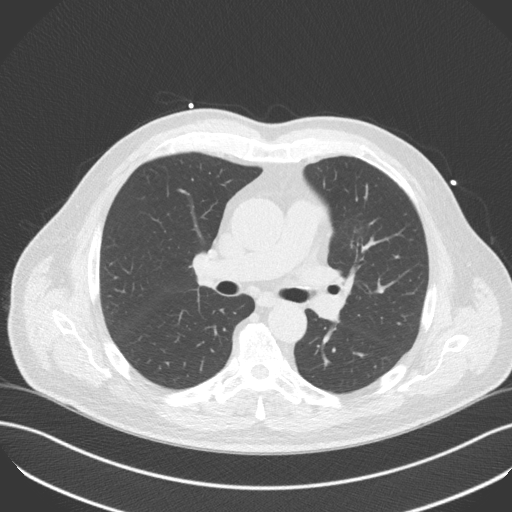
[im 41/47  lung]
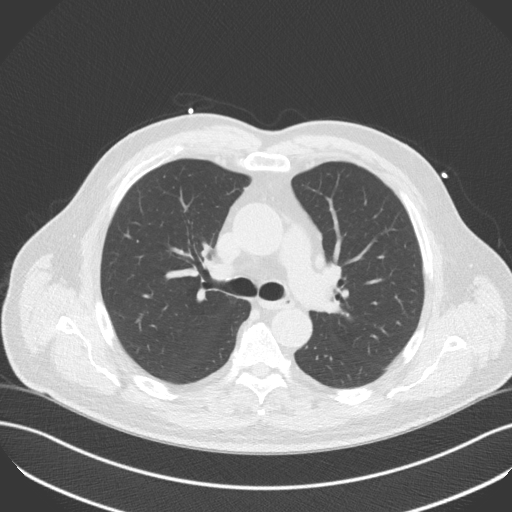

[16 of 20 positions shown; findings below may reference images not displayed]

FINDINGS: Aortic atherosclerosis. Within the visualized portions of the thorax
there are no suspicious appearing pulmonary nodules or masses, there
is no acute consolidative airspace disease, no pleural effusions, no
pneumothorax and no lymphadenopathy. Visualized portions of the
upper abdomen are unremarkable. There are no aggressive appearing
lytic or blastic lesions noted in the visualized portions of the
skeleton.
IMPRESSION: 1.  Aortic Atherosclerosis (3KHYZ-276.6).
FINDINGS: Non-cardiac: See separate report from [REDACTED].

Ascending Aorta: Upper normal size measuring 39 mm with mild diffuse
calcifications.

Pericardium: Normal.

Coronary arteries: Normal origin.
IMPRESSION: Coronary calcium score of 638. This was 73 percentile for age and
sex matched control.

*** End of Addendum ***
EXAM:
OVER-READ INTERPRETATION  CT CHEST

The following report is an over-read performed by radiologist Dr.
Koniba Chibani [REDACTED] on 12/06/2018. This
over-read does not include interpretation of cardiac or coronary
anatomy or pathology. The coronary calcium score/coronary CTA
interpretation by the cardiologist is attached.
FINDINGS: Aortic atherosclerosis. Within the visualized portions of the thorax
there are no suspicious appearing pulmonary nodules or masses, there
is no acute consolidative airspace disease, no pleural effusions, no
pneumothorax and no lymphadenopathy. Visualized portions of the
upper abdomen are unremarkable. There are no aggressive appearing
lytic or blastic lesions noted in the visualized portions of the
skeleton.
IMPRESSION: 1.  Aortic Atherosclerosis (3KHYZ-276.6).

## 2021-03-17 ENCOUNTER — Ambulatory Visit: Payer: Medicare PPO | Admitting: Cardiovascular Disease

## 2021-03-19 ENCOUNTER — Ambulatory Visit: Payer: Medicare PPO

## 2021-03-19 ENCOUNTER — Ambulatory Visit (INDEPENDENT_AMBULATORY_CARE_PROVIDER_SITE_OTHER): Payer: Medicare PPO | Admitting: Cardiovascular Disease

## 2021-03-19 ENCOUNTER — Other Ambulatory Visit: Payer: Self-pay

## 2021-03-19 ENCOUNTER — Encounter: Payer: Self-pay | Admitting: Cardiovascular Disease

## 2021-03-19 VITALS — BP 140/90 | HR 62 | Ht 67.0 in | Wt 195.4 lb

## 2021-03-19 DIAGNOSIS — I471 Supraventricular tachycardia: Secondary | ICD-10-CM

## 2021-03-19 DIAGNOSIS — I10 Essential (primary) hypertension: Secondary | ICD-10-CM

## 2021-03-19 DIAGNOSIS — E782 Mixed hyperlipidemia: Secondary | ICD-10-CM

## 2021-03-19 DIAGNOSIS — Z23 Encounter for immunization: Secondary | ICD-10-CM | POA: Diagnosis not present

## 2021-03-19 MED ORDER — HYDROCHLOROTHIAZIDE 25 MG PO TABS: 25.0000 mg | ORAL_TABLET | Freq: Every day | ORAL | 3 refills | Status: DC

## 2021-03-19 MED ORDER — SILDENAFIL CITRATE 50 MG PO TABS: 50.0000 mg | ORAL_TABLET | Freq: Every day | ORAL | 5 refills | Status: DC | PRN

## 2021-03-19 MED ORDER — TELMISARTAN 80 MG PO TABS: 80.0000 mg | ORAL_TABLET | Freq: Every day | ORAL | 3 refills | Status: DC

## 2021-03-19 NOTE — Progress Notes (Signed)
Evaluation Performed:  Follow-up visit  Date:  03/19/2021   ID:  Mark Farley., DOB 1946-01-28, MRN [id number]  Patient Location:  [address] [place] [address]   Provider location:   [facility], [place] office  PCP:  Sherlene Shams, MD  Cardiologist:  Fonnie Mu   Chief Complaint  Patient presents with   6 month follow up     "Doing well." Medications reviewed by the patient verbally.     History of Present Illness:    Mark Farley. is a 75 y.o. male past medical history of HTN,  hyperlipidemia previous syncope, negative Myoview in 2005. h/o palpitations and had a Life Watch monitor for several weeks but no events arthritis,  anemia from iron deficiency Previous Holter monitor showing runs of SVT, rate up to 150 bpm Elevated LFTS on crestor 10 He presents today for follow-up of his palpitations, hypertension  In follow-up today, recent travel to family, [facility] Long drive, travel, walking, Got out of his car, walked into a store, had near syncope Stood there for some time, slowly recovered  1 or 2 weeks ago, 1 episode was driving, felt dizzy, slow down the car, resolved  Episode of sweating last night while in a sitting position Have been watching TV, had just had a snack  Continues on Micardis HCTZ, also on metoprolol succinate 50 daily  Weight stable Denies anginal symptoms Blood pressure measurements reviewed ranging from 120s to 140, heart rate 60-70  On prior clinic visit we discussed episode of Syncope 07/24/20,wife heard him fall, got up too quickly Was working on the ground, got up quickly Concern for prerenal state  CT coronary calcium scoring July 2020,  Score 638, 73rd percentile Aorta with mild atherosclerosis  EKG personally reviewed by myself on todays visit Shows normal sinus rhythm rate 62 bpm no significant ST-T wave changes   Lab Results  Component Value Date    CHOL 110 04/18/2020   HDL 45.10 04/18/2020   LDLCALC 34 04/18/2020   TRIG 155.0 (H) 04/18/2020    Past medical history reviewed severe series of tachycardia episodes day after Thanksgiving 2016 Reported he was driving in a car back from a trip, tachycardia seem to happen every 5 minutes. He took several diltiazem, possibly up to 3 or 4 and eventually symptoms seem to improve.   Holter monitor showing frequent episodes of SVT, short-lived total cholesterol less than 150   Past Medical History:  Diagnosis Date   Arthritis    ? Gout   Bilateral carpal tunnel syndrome    managed with nocturnal braces x 3 years   COPD (chronic obstructive pulmonary disease) (HCC)    Dysrhythmia    Tachycardia, PVCs, SVTs   GERD (gastroesophageal reflux disease)    Glucose intolerance (impaired glucose tolerance)    Heart murmur    as a child   History of syncope 2005   Unexplained, myoview negative   Hyperlipidemia    Hypertension    Iron deficiency anemia    Osteoarthritis of multiple joints    Palpitations 2010   Life Watch with single PVC   Pre-diabetes    Past Surgical History:  Procedure Laterality Date   APPENDECTOMY     1957   COLONOSCOPY     COLONOSCOPY WITH PROPOFOL N/A 01/24/2020   Procedure: COLONOSCOPY WITH PROPOFOL;  Surgeon: Toney Reil, MD;  Location: [facility] END[facility]COPY;  Service: Gastroenterology;  Laterality:  N/A;   JOINT REPLACEMENT     TONSILLECTOMY     1952   TOTAL KNEE ARTHROPLASTY Right 03/14/2019   Procedure: RIGHT TOTAL KNEE ARTHROPLASTY;  Surgeon: Cammy Copa, MD;  Location: [facility] OR;  Service: Orthopedics;  Laterality: Right;   TOTAL KNEE ARTHROPLASTY Left 06/22/2019   Procedure: LEFT TOTAL KNEE ARTHROPLASTY;  Surgeon: Cammy Copa, MD;  Location: [facility] OR;  Service: Orthopedics;  Laterality: Left;     Current Meds  Medication Sig   amoxicillin (AMOXIL) 500 MG tablet Take 4 tablets by mouth (2,000 mg) 30-60 minutes prior to dental procedure.    aspirin EC 81 MG tablet Take 81 mg by mouth daily. Swallow whole.   celecoxib (CELEBREX) 100 MG capsule Take 100 mg by mouth daily.   diltiazem (CARDIZEM) 30 MG tablet Take 1 tablet (30 mg total) by mouth 4 (four) times daily as needed.   hydrALAZINE (APRESOLINE) 25 MG tablet Take 1 tablet (25 mg total) by mouth 3 (three) times daily as needed (for pressure >150).   metoprolol succinate (TOPROL-XL) 50 MG 24 hr tablet Take 1 tablet (50 mg total) by mouth daily.   omeprazole (PRILOSEC) 20 MG capsule Take 1 capsule (20 mg total) by mouth daily.   rosuvastatin (CRESTOR) 20 MG tablet Take by mouth.   sildenafil (VIAGRA) 50 MG tablet Take 1.5 tablets (75 mg total) by mouth daily as needed for erectile dysfunction.   telmisartan-hydrochlorothiazide (MICARDIS HCT) 80-25 MG tablet Take 1 tablet by mouth daily.   timolol (TIMOPTIC) 0.5 % ophthalmic solution 1 drop 2 (two) times daily.   traMADol (ULTRAM) 50 MG tablet Take 50 mg by mouth 2 (two) times daily as needed.   valACYclovir (VALTREX) 1000 MG tablet Take 1-2 g by mouth daily as needed (cold sores/fever blisters).      Allergies:   Bee venom   Social History   Tobacco Use   Smoking status: Never   Smokeless tobacco: Never  Vaping Use   Vaping Use: Never used  Substance Use Topics   Alcohol use: Yes    Alcohol/week: 14.0 standard drinks    Types: 14 Standard drinks or equivalent per week    Comment: 2 glasses of wine a day   Drug use: No     Family Hx: The patient's family history includes Colon cancer in his brother and paternal grandmother; Coronary artery disease in his mother; Heart attack in his mother; Heart failure in his father.  ROS:   Please see the history of present illness.    Review of Systems  HENT: Negative.    Respiratory: Negative.    Cardiovascular: Negative.   Gastrointestinal: Negative.   Musculoskeletal: Negative.   Neurological:  Positive for dizziness.  Psychiatric/Behavioral: Negative.    All other  systems reviewed and are negative.   Labs/Other Tests and Data Reviewed:    Recent Labs: 04/18/2020: BUN 11; Creatinine, Ser 0.85; Potassium 4.2; Sodium 133; TSH 0.73 06/24/2020: ALT 22   Recent Lipid Panel Lab Results  Component Value Date/Time   CHOL 110 04/18/2020 09:08 AM   TRIG 155.0 (H) 04/18/2020 09:08 AM   TRIG 108 05/01/2009 12:00 AM   HDL 45.10 04/18/2020 09:08 AM   CHOLHDL 2 04/18/2020 09:08 AM   LDLCALC 34 04/18/2020 09:08 AM   LDLDIRECT 81.0 03/08/2017 10:06 AM    Wt Readings from Last 3 Encounters:  03/19/21 195 lb 6 oz (88.6 kg)  09/18/20 191 lb 4 oz (86.8 kg)  04/18/20 185 lb 9.6 oz (  84.2 kg)     Exam:    BP 140/90 (BP Location: Left Arm, Patient Position: Sitting, Cuff Size: Normal)   Pulse 62   Ht 5\' 7"  (1.702 m)   Wt 195 lb 6 oz (88.6 kg)   SpO2 99%   BMI 30.60 kg/m  Constitutional:  oriented to person, place, and time. No distress.  HENT:  Head: Grossly normal Eyes:  no discharge. No scleral icterus.  Neck: No JVD, no carotid bruits  Cardiovascular: Regular rate and rhythm, no murmurs appreciated Pulmonary/Chest: Clear to auscultation bilaterally, no wheezes or rails Abdominal: Soft.  no distension.  no tenderness.  Musculoskeletal: Normal range of motion Neurological:  normal muscle tone. Coordination normal. No atrophy Skin: Skin warm and dry Psychiatric: normal affect, pleasant  ASSESSMENT & PLAN:    SVT (supraventricular tachycardia) (HCC) No significant breakthrough arrhythmia, takes diltiazem 30 mg pills as needed  HYPERTENSION, BENIGN Continued episodes of near syncope concerning for orthostasis Recommended we split the telmisartan and HCTZ Consider holding HCTZ when traveling, busy, 1 less hydrated in effort to avoid orthostasis symptoms If blood pressure runs high with periodically holding HCTZ may need additional alternate blood pressure medication Will avoid increasing beta-blocker  PVC's (premature ventricular  contractions) Previously seen on monitor, continue beta-blocker, asymptomatic  Mixed hyperlipidemia Prior transaminitis, seen by GI, has had weight loss since then Tolerating rosuvastatin Cholesterol at goal  CAD with stable angina Coronary calcification score 638 on scan in 2020 Cholesterol at goal, no anginal symptoms No further ischemic work-up  History of syncope/near syncope Prior episode of syncope early 2022, likely orthostasis Continued episodes of near syncope Recommend we split the HCTZ, hold  HCTZ for days when there is less fluid intake If symptoms persist, may need repeat ZIO monitor   Total encounter time more than 25 minutes  Greater than 50% was spent in counseling and coordination of care with the patient   Signed, 2023, MD  03/19/2021 8:53 AM    [facility] [address], [place], [place]

## 2021-03-19 NOTE — Patient Instructions (Addendum)
Medication Instructions:  We will split the telmisartan and HCTZ into two pills STOP telmisartan-hydrochlorothiazide (HCTZ) Please START Telmisartan 80 mg daily HCTZ 25 mg daily Hold the HCTZ  when busy, hot, not drinking enough, or traveling  If you need a refill on your cardiac medications before your next appointment, please call your pharmacy.    Lab work: No new labs needed  Testing/Procedures: No new testing needed  Follow-Up: At [facility], you and your health needs are our priority.  As part of our continuing mission to provide you with exceptional heart care, we have created designated Provider Care Teams.  These Care Teams include your primary Cardiologist (physician) and Advanced Practice Providers (APPs -  Physician Assistants and Nurse Practitioners) who all work together to provide you with the care you need, when you need it.  You will need a follow up appointment in 12 months  Providers on your designated Care Team:   Nicolasa Ducking, NP Eula Listen, PA-C Marisue Ivan, PA-C Cadence Rupert, [place]  COVID-19 Vaccine Information can be found at: [email or web address] For questions related to vaccine distribution or appointments, please email [email or web address] or call [phone].

## 2021-04-12 DIAGNOSIS — E782 Mixed hyperlipidemia: Secondary | ICD-10-CM

## 2021-04-12 DIAGNOSIS — R7303 Prediabetes: Secondary | ICD-10-CM

## 2021-04-18 ENCOUNTER — Other Ambulatory Visit: Payer: Self-pay

## 2021-04-18 ENCOUNTER — Other Ambulatory Visit (INDEPENDENT_AMBULATORY_CARE_PROVIDER_SITE_OTHER): Payer: Medicare PPO

## 2021-04-18 DIAGNOSIS — R7303 Prediabetes: Secondary | ICD-10-CM

## 2021-04-18 DIAGNOSIS — E782 Mixed hyperlipidemia: Secondary | ICD-10-CM

## 2021-04-18 LAB — HEMOGLOBIN A1C: Hgb A1c MFr Bld: 6.1 % (ref 4.6–6.5)

## 2021-04-18 LAB — COMPREHENSIVE METABOLIC PANEL
ALT: 49 U/L (ref 0–53)
AST: 35 U/L (ref 0–37)
Albumin: 4.5 g/dL (ref 3.5–5.2)
Alkaline Phosphatase: 57 U/L (ref 39–117)
BUN: 23 mg/dL (ref 6–23)
CO2: 29 mEq/L (ref 19–32)
Calcium: 9.5 mg/dL (ref 8.4–10.5)
Chloride: 96 mEq/L (ref 96–112)
Creatinine, Ser: 1.04 mg/dL (ref 0.40–1.50)
GFR: 70.18 mL/min (ref >60.00)
Glucose, Bld: 101 mg/dL — ABNORMAL HIGH (ref 70–99)
Potassium: 3.8 mEq/L (ref 3.5–5.1)
Sodium: 134 mEq/L — ABNORMAL LOW (ref 135–145)
Total Bilirubin: 1.5 mg/dL — ABNORMAL HIGH (ref 0.2–1.2)
Total Protein: 7.3 g/dL (ref 6.0–8.3)

## 2021-04-18 LAB — LIPID PANEL
Cholesterol: 131 mg/dL (ref 0–200)
HDL: 48.4 mg/dL (ref >39.00)
LDL Cholesterol: 49 mg/dL (ref 0–99)
NonHDL: 82.15
Total CHOL/HDL Ratio: 3
Triglycerides: 167 mg/dL — ABNORMAL HIGH (ref 0.0–149.0)
VLDL: 33.4 mg/dL (ref 0.0–40.0)

## 2021-04-21 ENCOUNTER — Encounter: Payer: Self-pay | Admitting: Internal Medicine

## 2021-04-21 ENCOUNTER — Ambulatory Visit (INDEPENDENT_AMBULATORY_CARE_PROVIDER_SITE_OTHER): Payer: Medicare PPO | Admitting: Internal Medicine

## 2021-04-21 ENCOUNTER — Other Ambulatory Visit: Payer: Self-pay

## 2021-04-21 DIAGNOSIS — I700 Atherosclerosis of aorta: Secondary | ICD-10-CM | POA: Diagnosis not present

## 2021-04-21 DIAGNOSIS — I471 Supraventricular tachycardia: Secondary | ICD-10-CM | POA: Diagnosis not present

## 2021-04-21 DIAGNOSIS — R7303 Prediabetes: Secondary | ICD-10-CM | POA: Diagnosis not present

## 2021-04-21 DIAGNOSIS — I10 Essential (primary) hypertension: Secondary | ICD-10-CM

## 2021-04-21 DIAGNOSIS — E782 Mixed hyperlipidemia: Secondary | ICD-10-CM | POA: Diagnosis not present

## 2021-04-21 NOTE — Assessment & Plan Note (Signed)
LDL is at goal on Crestor  Lab Results  Component Value Date   CHOL 131 04/18/2021   HDL 48.40 04/18/2021   LDLCALC 49 04/18/2021   LDLDIRECT 81.0 03/08/2017   TRIG 167.0 (H) 04/18/2021   CHOLHDL 3 04/18/2021

## 2021-04-21 NOTE — Assessment & Plan Note (Signed)
Managed with low dose metoprolol and prn  cardizem.Marland Kitchen  No changes today

## 2021-04-21 NOTE — Assessment & Plan Note (Signed)
Reviewed findings of prior CT scan today..  Patient is tolerating high potency statin therapy with crestor  

## 2021-04-21 NOTE — Progress Notes (Signed)
Patient ID: Mark Pherigo., male    DOB: Jun 02, 1945  Age: 75 y.o. MRN: [id number]  The patient is here for follow up and management of other chronic and acute problems.   The risk factors are reflected in the social history.  The roster of all physicians providing medical care to patient - is listed in the Snapshot section of the chart.  Activities of daily living:  The patient is 100% independent in all ADLs: dressing, toileting, feeding as well as independent mobility  Home safety : The patient has smoke detectors in the home. They wear seatbelts.  There are no firearms at home. There is no violence in the home.   There is no risks for hepatitis, STDs or HIV. There is no   history of blood transfusion. They have no travel history to infectious disease endemic areas of the world.  The patient has seen their dentist in the last six month. They have seen their eye doctor in the last year. They admit to slight hearing difficulty with regard to whispered voices and some television programs.  They have deferred audiologic testing in the last year.  They do not  have excessive sun exposure. Discussed the need for sun protection: hats, long sleeves and use of sunscreen if there is significant sun exposure.   Diet: the importance of a healthy diet is discussed. They do have a healthy diet.  The benefits of regular aerobic exercise were discussed. She walks 4 times per week ,  20 minutes.   Depression screen: there are no signs or vegative symptoms of depression- irritability, change in appetite, anhedonia, sadness/tearfullness.  Cognitive assessment: the patient manages all their financial and personal affairs and is actively engaged. They could relate day,date,year and events; recalled 2/3 objects at 3 minutes; performed clock-face test normally.  The following portions of the patient's history were reviewed and updated as appropriate: allergies, current medications, past family history, past  medical history,  past surgical history, past social history  and problem list.  Visual acuity was not assessed per patient preference since she has regular follow up with her ophthalmologist. Hearing and body mass index were assessed and reviewed.   During the course of the visit the patient was educated and counseled about appropriate screening and preventive services including : fall prevention , diabetes screening, nutrition counseling, colorectal cancer screening, and recommended immunizations.    CC: Diagnoses of Mixed hyperlipidemia, HYPERTENSION, BENIGN, Prediabetes, SVT (supraventricular tachycardia) (HCC), and Aortic atherosclerosis (HCC) were pertinent to this visit.  1) Reviewed findings of prior CT scan today..  Patient is tolerating high potency statin therapy  (Crestor)  2) Hypertension ;  home readings are < 130/80 checked once weekly  3)  Obesity with fatty liver and prediabetes : frustrated at weight gain.  Exercising regularly at [facility] gym   4) bilateral hand and wrist pain.  [facility] never [facility],  wakes up with hands numb.  Not using splints but taking celebrex.  Some decrease in dexterity . Does not want surgery at this point.    History Mark Farley has a past medical history of Arthritis, Bilateral carpal tunnel syndrome, COPD (chronic obstructive pulmonary disease) (HCC), Dysrhythmia, GERD (gastroesophageal reflux disease), Glucose intolerance (impaired glucose tolerance), Heart murmur, History of syncope (2005), Hyperlipidemia, Hypertension, Iron deficiency anemia, Osteoarthritis of multiple joints, Palpitations (2010), and Pre-diabetes.   He has a past surgical history that includes Colonoscopy; Tonsillectomy; Appendectomy; Total knee arthroplasty (Right, 03/14/2019); Joint replacement; Total  knee arthroplasty (Left, 06/22/2019); and Colonoscopy with propofol (N/A, 01/24/2020).   His family history includes Colon cancer in his brother and paternal  grandmother; Coronary artery disease in his mother; Heart attack in his mother; Heart failure in his father.He reports that he has never smoked. He has never used smokeless tobacco. He reports current alcohol use of about 14.0 standard drinks per week. He reports that he does not use drugs.  Outpatient Medications Prior to Visit  Medication Sig Dispense Refill   amoxicillin (AMOXIL) 500 MG tablet Take 4 tablets by mouth (2,000 mg) 30-60 minutes prior to dental procedure. 10 tablet 0   aspirin EC 81 MG tablet Take 81 mg by mouth daily. Swallow whole.     celecoxib (CELEBREX) 100 MG capsule Take 100 mg by mouth daily.     diltiazem (CARDIZEM) 30 MG tablet Take 1 tablet (30 mg total) by mouth 4 (four) times daily as needed. 90 tablet 3   hydrALAZINE (APRESOLINE) 25 MG tablet Take 1 tablet (25 mg total) by mouth 3 (three) times daily as needed (for pressure >150). 90 tablet 3   hydrochlorothiazide (HYDRODIURIL) 25 MG tablet Take 1 tablet (25 mg total) by mouth daily. 90 tablet 3   metoprolol succinate (TOPROL-XL) 50 MG 24 hr tablet Take 1 tablet (50 mg total) by mouth daily. 90 tablet 3   omeprazole (PRILOSEC) 20 MG capsule Take 1 capsule (20 mg total) by mouth daily. 90 capsule 3   rosuvastatin (CRESTOR) 20 MG tablet Take by mouth.     sildenafil (VIAGRA) 50 MG tablet Take 1 tablet (50 mg total) by mouth daily as needed for erectile dysfunction. 30 tablet 5   telmisartan (MICARDIS) 80 MG tablet Take 1 tablet (80 mg total) by mouth daily. 90 tablet 3   timolol (TIMOPTIC) 0.5 % ophthalmic solution 1 drop 2 (two) times daily.     traMADol (ULTRAM) 50 MG tablet Take 50 mg by mouth 2 (two) times daily as needed.     valACYclovir (VALTREX) 1000 MG tablet Take 1-2 g by mouth daily as needed (cold sores/fever blisters).      No facility-administered medications prior to visit.    Review of Systems  Objective:  BP 124/70 (BP Location: Left Arm, Patient Position: Sitting, Cuff Size: Normal)   Pulse 71    Temp (!) 96.4 F (35.8 C) (Temporal)   Ht 5\' 7"  (1.702 m)   Wt 197 lb 12.8 oz (89.7 kg)   SpO2 96%   BMI 30.98 kg/m   Physical Exam  Physical Exam   Assessment & Plan:   Problem List Items Addressed This Visit     Mixed hyperlipidemia    LDL is at goal on Crestor  Lab Results  Component Value Date   CHOL 131 04/18/2021   HDL 48.40 04/18/2021   LDLCALC 49 04/18/2021   LDLDIRECT 81.0 03/08/2017   TRIG 167.0 (H) 04/18/2021   CHOLHDL 3 04/18/2021         HYPERTENSION, BENIGN    Well controlled on current regimen of hctz telmisartan  and metoprolol . Renal function stable, no changes today.      Prediabetes    Managed with low GI diet, portion reduction  and exercise.  a1c is stable .  He is  taking a statin     Lab Results  Component Value Date   HGBA1C 6.1 04/18/2021         SVT (supraventricular tachycardia) (HCC)    Managed with low dose metoprolol  and prn  cardizem.Marland Kitchen  No changes today      Aortic atherosclerosis (HCC)    Reviewed findings of prior CT scan today..  Patient is tolerating high potency statin therapy with crestor        I am having Benay Pillow. "[name]" maintain his omeprazole, diltiazem, metoprolol succinate, celecoxib, valACYclovir, amoxicillin, timolol, traMADol, rosuvastatin, aspirin EC, hydrALAZINE, sildenafil, telmisartan, and hydrochlorothiazide.  No orders of the defined types were placed in this encounter.   There are no discontinued medications.  Follow-up: Return in about 6 months (around 10/19/2021).   Sherlene Shams, MD

## 2021-04-21 NOTE — Assessment & Plan Note (Signed)
Managed with low GI diet, portion reduction  and exercise.  a1c is stable .  He is  taking a statin     Lab Results  Component Value Date   HGBA1C 6.1 04/18/2021

## 2021-04-21 NOTE — Patient Instructions (Addendum)
You're doing fine!  Do not worry about the sodium or the triglycerides  "Moderation  in all things "  is still the best advice  May you feel the blessings of our God, the love of Jesus Christ, and the peace that comes from the [facility] this week and in the weeks to come.  Happy Thanksgiving!  Regards,   Duncan Dull, MD

## 2021-04-21 NOTE — Assessment & Plan Note (Addendum)
Well controlled on current regimen of hctz telmisartan  and metoprolol . Renal function stable, no changes today.

## 2021-04-23 DIAGNOSIS — D2272 Melanocytic nevi of left lower limb, including hip: Secondary | ICD-10-CM | POA: Diagnosis not present

## 2021-04-23 DIAGNOSIS — L820 Inflamed seborrheic keratosis: Secondary | ICD-10-CM | POA: Diagnosis not present

## 2021-04-23 DIAGNOSIS — D225 Melanocytic nevi of trunk: Secondary | ICD-10-CM | POA: Diagnosis not present

## 2021-04-23 DIAGNOSIS — L304 Erythema intertrigo: Secondary | ICD-10-CM | POA: Diagnosis not present

## 2021-04-23 DIAGNOSIS — D2262 Melanocytic nevi of left upper limb, including shoulder: Secondary | ICD-10-CM | POA: Diagnosis not present

## 2021-04-23 DIAGNOSIS — D2261 Melanocytic nevi of right upper limb, including shoulder: Secondary | ICD-10-CM | POA: Diagnosis not present

## 2021-04-23 DIAGNOSIS — L298 Other pruritus: Secondary | ICD-10-CM | POA: Diagnosis not present

## 2021-05-15 ENCOUNTER — Encounter: Payer: Self-pay | Admitting: Cardiovascular Disease

## 2021-05-16 ENCOUNTER — Other Ambulatory Visit: Payer: Self-pay

## 2021-05-16 MED ORDER — DILTIAZEM HCL 30 MG PO TABS: 30.0000 mg | ORAL_TABLET | Freq: Four times a day (QID) | ORAL | 3 refills | Status: DC | PRN

## 2021-06-23 ENCOUNTER — Telehealth: Payer: Self-pay | Admitting: Cardiovascular Disease

## 2021-06-23 DIAGNOSIS — I10 Essential (primary) hypertension: Secondary | ICD-10-CM

## 2021-06-23 NOTE — Telephone Encounter (Signed)
Pt c/o medication issue:  1. Name of Medication: micardis   2. How are you currently taking this medication (dosage and times per day)? Backordered   3. Are you having a reaction (difficulty breathing--STAT)? No   4. What is your medication issue? Per pharmacy back ordered but can get micardis HCT 80/25 .  Please call to advise if ok to combine and send new rx    [phone]

## 2021-06-24 MED ORDER — TELMISARTAN-HCTZ 80-25 MG PO TABS: 1.0000 | ORAL_TABLET | Freq: Every day | ORAL | 3 refills | Status: DC

## 2021-06-24 NOTE — Telephone Encounter (Signed)
Patient is out and pharmacy is asking for answer

## 2021-06-24 NOTE — Telephone Encounter (Signed)
Spoke with Dr. Mariah Milling who stated that it was fine for pt to take the Micardis HCT 8-25.   Sent RX to pharmacy. Called pharmacy to let them know that I had sent rx in and taken the plain micardis and hctz off the patient's med list. Pharmacist stated that earlier in the day they had received the OK from Dr. Mariah Milling to give pt combo pill.   Pharmacist voiced appreciation for the call.

## 2021-07-17 ENCOUNTER — Encounter: Payer: Self-pay | Admitting: Cardiovascular Disease

## 2021-09-24 ENCOUNTER — Encounter: Payer: Self-pay | Admitting: Internal Medicine

## 2021-10-06 ENCOUNTER — Encounter: Payer: Self-pay | Admitting: Internal Medicine

## 2021-10-06 DIAGNOSIS — Z125 Encounter for screening for malignant neoplasm of prostate: Secondary | ICD-10-CM

## 2021-10-06 DIAGNOSIS — R7303 Prediabetes: Secondary | ICD-10-CM

## 2021-10-06 DIAGNOSIS — R5383 Other fatigue: Secondary | ICD-10-CM

## 2021-10-06 DIAGNOSIS — I10 Essential (primary) hypertension: Secondary | ICD-10-CM

## 2021-10-06 DIAGNOSIS — E782 Mixed hyperlipidemia: Secondary | ICD-10-CM

## 2021-10-13 ENCOUNTER — Other Ambulatory Visit (INDEPENDENT_AMBULATORY_CARE_PROVIDER_SITE_OTHER): Payer: Medicare PPO

## 2021-10-13 DIAGNOSIS — Z125 Encounter for screening for malignant neoplasm of prostate: Secondary | ICD-10-CM | POA: Diagnosis not present

## 2021-10-13 DIAGNOSIS — R7303 Prediabetes: Secondary | ICD-10-CM | POA: Diagnosis not present

## 2021-10-13 DIAGNOSIS — E782 Mixed hyperlipidemia: Secondary | ICD-10-CM | POA: Diagnosis not present

## 2021-10-13 DIAGNOSIS — R5383 Other fatigue: Secondary | ICD-10-CM | POA: Diagnosis not present

## 2021-10-13 DIAGNOSIS — I10 Essential (primary) hypertension: Secondary | ICD-10-CM | POA: Diagnosis not present

## 2021-10-13 LAB — CBC WITH DIFFERENTIAL/PLATELET
Basophils Absolute: 0 10*3/uL (ref 0.0–0.1)
Basophils Relative: 0.5 % (ref 0.0–3.0)
Eosinophils Absolute: 0.1 10*3/uL (ref 0.0–0.7)
Eosinophils Relative: 2.1 % (ref 0.0–5.0)
HCT: 41.4 % (ref 39.0–52.0)
Hemoglobin: 14.1 g/dL (ref 13.0–17.0)
Lymphocytes Relative: 39 % (ref 12.0–46.0)
Lymphs Abs: 2 10*3/uL (ref 0.7–4.0)
MCHC: 34.1 g/dL (ref 30.0–36.0)
MCV: 91.3 fl (ref 78.0–100.0)
Monocytes Absolute: 0.5 10*3/uL (ref 0.1–1.0)
Monocytes Relative: 10.6 % (ref 3.0–12.0)
Neutro Abs: 2.5 10*3/uL (ref 1.4–7.7)
Neutrophils Relative %: 47.8 % (ref 43.0–77.0)
Platelets: 149 10*3/uL — ABNORMAL LOW (ref 150.0–400.0)
RBC: 4.53 Mil/uL (ref 4.22–5.81)
RDW: 13.7 % (ref 11.5–15.5)
WBC: 5.1 10*3/uL (ref 4.0–10.5)

## 2021-10-13 LAB — COMPREHENSIVE METABOLIC PANEL
ALT: 42 U/L (ref 0–53)
AST: 31 U/L (ref 0–37)
Albumin: 4.3 g/dL (ref 3.5–5.2)
Alkaline Phosphatase: 55 U/L (ref 39–117)
BUN: 14 mg/dL (ref 6–23)
CO2: 29 mEq/L (ref 19–32)
Calcium: 9.2 mg/dL (ref 8.4–10.5)
Chloride: 98 mEq/L (ref 96–112)
Creatinine, Ser: 0.88 mg/dL (ref 0.40–1.50)
GFR: 83.76 mL/min (ref >60.00)
Glucose, Bld: 105 mg/dL — ABNORMAL HIGH (ref 70–99)
Potassium: 4 mEq/L (ref 3.5–5.1)
Sodium: 135 mEq/L (ref 135–145)
Total Bilirubin: 1 mg/dL (ref 0.2–1.2)
Total Protein: 6.6 g/dL (ref 6.0–8.3)

## 2021-10-13 LAB — LIPID PANEL
Cholesterol: 130 mg/dL (ref 0–200)
HDL: 47.9 mg/dL (ref >39.00)
LDL Cholesterol: 54 mg/dL (ref 0–99)
NonHDL: 81.71
Total CHOL/HDL Ratio: 3
Triglycerides: 139 mg/dL (ref 0.0–149.0)
VLDL: 27.8 mg/dL (ref 0.0–40.0)

## 2021-10-13 LAB — LDL CHOLESTEROL, DIRECT: Direct LDL: 76 mg/dL

## 2021-10-13 LAB — HEMOGLOBIN A1C: Hgb A1c MFr Bld: 6 % (ref 4.6–6.5)

## 2021-10-13 LAB — TSH: TSH: 1.16 u[IU]/mL (ref 0.35–5.50)

## 2021-10-13 LAB — PSA, MEDICARE: PSA: 0.6 ng/ml (ref 0.10–4.00)

## 2021-10-14 LAB — MICROALBUMIN / CREATININE URINE RATIO
Creatinine,U: 103.2 mg/dL
Microalb Creat Ratio: 0.7 mg/g (ref 0.0–30.0)
Microalb, Ur: 0.7 mg/dL (ref 0.0–1.9)

## 2021-10-20 ENCOUNTER — Ambulatory Visit (INDEPENDENT_AMBULATORY_CARE_PROVIDER_SITE_OTHER): Payer: Medicare PPO

## 2021-10-20 ENCOUNTER — Ambulatory Visit (INDEPENDENT_AMBULATORY_CARE_PROVIDER_SITE_OTHER): Payer: Medicare PPO | Admitting: Internal Medicine

## 2021-10-20 ENCOUNTER — Other Ambulatory Visit: Payer: Self-pay | Admitting: Emergency Medicine

## 2021-10-20 ENCOUNTER — Encounter: Payer: Self-pay | Admitting: Internal Medicine

## 2021-10-20 VITALS — BP 126/76 | HR 61 | Temp 98.2°F | Resp 15 | Ht 67.0 in | Wt 192.0 lb

## 2021-10-20 DIAGNOSIS — I493 Ventricular premature depolarization: Secondary | ICD-10-CM

## 2021-10-20 DIAGNOSIS — R7303 Prediabetes: Secondary | ICD-10-CM

## 2021-10-20 DIAGNOSIS — I480 Paroxysmal atrial fibrillation: Secondary | ICD-10-CM | POA: Diagnosis not present

## 2021-10-20 DIAGNOSIS — I10 Essential (primary) hypertension: Secondary | ICD-10-CM

## 2021-10-20 DIAGNOSIS — I700 Atherosclerosis of aorta: Secondary | ICD-10-CM

## 2021-10-20 DIAGNOSIS — I471 Supraventricular tachycardia: Secondary | ICD-10-CM

## 2021-10-20 DIAGNOSIS — I499 Cardiac arrhythmia, unspecified: Secondary | ICD-10-CM | POA: Insufficient documentation

## 2021-10-20 NOTE — Assessment & Plan Note (Signed)
Reviewed findings of prior CT scan today..  Patient is tolerating high potency statin therapy with crestor  

## 2021-10-20 NOTE — Assessment & Plan Note (Signed)
Managed with low GI diet, weight loss and exercise.  a1c is stable .  He is  taking a statin     Lab Results  Component Value Date   HGBA1C 6.0 10/13/2021

## 2021-10-20 NOTE — Assessment & Plan Note (Signed)
Managed with low dose metoprolol and prn  cardizem.Marland Kitchen  No changes today

## 2021-10-20 NOTE — Patient Instructions (Signed)
The abnormal rhythm your watch detected MAY NOT be atrial fibrillation.  Dr Mariah Milling is ordering a heart monitor for you to wear that will confirm or rule out atrial fib.  If you develop chest pain , vertigo,  or shortness of breath, please contact either of [place] OR GO DIRECTLY TO ER for evaluation

## 2021-10-20 NOTE — Assessment & Plan Note (Addendum)
Per patient,  Atrial fib detected by his Apple watch.   He has a history of PVCs and History of SVT.  CHA2DS2VASC score is 4 (Age, male, HTN,  PAD) .Marland Kitchen  Thyroid screen is normal, he is not anemic and lytes are normal.  He denies any history of OSA .  I have ordered and reviewed a 12 lead EKG and find that HE IS NOT CURRENTLY IN ATRIAL FIB, having frequent PVS and atrial bradycardia rate 56  .  Will need ZIO monitor,which has  been ordered by t cardiology follow up.

## 2021-10-20 NOTE — Progress Notes (Signed)
Subjective:  Patient ID: Mark Farley., male    DOB: 22-Apr-1946  Age: 76 y.o. MRN: [id number]  CC: The primary encounter diagnosis was Paroxysmal atrial fibrillation ([place]). Diagnoses of PAF (paroxysmal atrial fibrillation) ([place]), Aortic atherosclerosis ([place]), Prediabetes, HYPERTENSION, BENIGN, and SVT (supraventricular tachycardia) ([place]) were also pertinent to this visit.   HPI Mark Farley. presents for 6 month follow up on hypertension, hyperlipidemia  and prediabetes Chief Complaint  Patient presents with   Follow-up    Follow up for HLD   Hyperlipidemia   1) recent detection of atrial fibrillation via Apple Watch . Paroxsymal , not occurring daily.  Denies dyspnea, chest pain.  History of SVT and PVC's.  Baseline pulse rate is 50-60.   Appt with cardiology next month    2) Hypertension: patient checks blood pressure twice weekly at home.  Readings have been for the most part < 120/70 at rest . Patient is following a reduced salt diet most days and is taking medications as prescribed   3)   Outpatient Medications Prior to Visit  Medication Sig Dispense Refill   amoxicillin (AMOXIL) 500 MG tablet Take 4 tablets by mouth (2,000 mg) 30-60 minutes prior to dental procedure. 10 tablet 0   aspirin EC 81 MG tablet Take 81 mg by mouth daily. Swallow whole.     celecoxib (CELEBREX) 100 MG capsule Take 100 mg by mouth daily.     diltiazem (CARDIZEM) 30 MG tablet Take 1 tablet (30 mg total) by mouth 4 (four) times daily as needed. 90 tablet 3   hydrALAZINE (APRESOLINE) 25 MG tablet Take 1 tablet (25 mg total) by mouth 3 (three) times daily as needed (for pressure >150). 90 tablet 3   metoprolol succinate (TOPROL-XL) 50 MG 24 hr tablet Take 1 tablet (50 mg total) by mouth daily. 90 tablet 3   omeprazole (PRILOSEC) 20 MG capsule Take 1 capsule (20 mg total) by mouth daily. 90 capsule 3   rosuvastatin (CRESTOR) 20 MG tablet Take by mouth.     sildenafil (VIAGRA) 50 MG tablet  Take 1 tablet (50 mg total) by mouth daily as needed for erectile dysfunction. 30 tablet 5   telmisartan-hydrochlorothiazide (MICARDIS HCT) 80-25 MG tablet Take 1 tablet by mouth daily. 90 tablet 3   timolol (TIMOPTIC) 0.5 % ophthalmic solution 1 drop 2 (two) times daily.     traMADol (ULTRAM) 50 MG tablet Take 50 mg by mouth 2 (two) times daily as needed.     valACYclovir (VALTREX) 1000 MG tablet Take 1-2 g by mouth daily as needed (cold sores/fever blisters).      No facility-administered medications prior to visit.    Review of Systems;  Patient denies headache, fevers, malaise, unintentional weight loss, skin rash, eye pain, sinus congestion and sinus pain, sore throat, dysphagia,  hemoptysis , cough, dyspnea, wheezing, chest pain, palpitations, orthopnea, edema, abdominal pain, nausea, melena, diarrhea, constipation, flank pain, dysuria, hematuria, urinary  Frequency, nocturia, numbness, tingling, seizures,  Focal weakness, Loss of consciousness,  Tremor, insomnia, depression, anxiety, and suicidal ideation.      Objective:  BP 126/76 (BP Location: Left Arm, Patient Position: Sitting, Cuff Size: Small)   Pulse 61   Temp 98.2 F (36.8 C) (Temporal)   Resp 15   Ht 5\' 7"  (1.702 m)   Wt 192 lb (87.1 kg)   SpO2 98%   BMI 30.07 kg/m   BP Readings from Last 3 Encounters:  10/20/21 126/76  04/21/21 124/70  03/19/21  140/90    Wt Readings from Last 3 Encounters:  10/20/21 192 lb (87.1 kg)  04/21/21 197 lb 12.8 oz (89.7 kg)  03/19/21 195 lb 6 oz (88.6 kg)    General appearance: alert, cooperative and appears stated age Ears: normal TM's and external ear canals both ears Throat: lips, mucosa, and tongue normal; teeth and gums normal Neck: no adenopathy, no carotid bruit, supple, symmetrical, trachea midline and thyroid not enlarged, symmetric, no tenderness/mass/nodules Back: symmetric, no curvature. ROM normal. No CVA tenderness. Lungs: clear to auscultation bilaterally Heart:  regular rate and rhythm, S1, S2 normal, no murmur, click, rub or gallop Abdomen: soft, non-tender; bowel sounds normal; no masses,  no organomegaly Pulses: 2+ and symmetric Skin: Skin color, texture, turgor normal. No rashes or lesions Lymph nodes: Cervical, supraclavicular, and axillary nodes normal.  Lab Results  Component Value Date   HGBA1C 6.0 10/13/2021   HGBA1C 6.1 04/18/2021   HGBA1C 6.0 04/18/2020    Lab Results  Component Value Date   CREATININE 0.88 10/13/2021   CREATININE 1.04 04/18/2021   CREATININE 0.85 04/18/2020    Lab Results  Component Value Date   WBC 5.1 10/13/2021   HGB 14.1 10/13/2021   HCT 41.4 10/13/2021   PLT 149.0 (L) 10/13/2021   GLUCOSE 105 (H) 10/13/2021   CHOL 130 10/13/2021   TRIG 139.0 10/13/2021   HDL 47.90 10/13/2021   LDLDIRECT 76.0 10/13/2021   LDLCALC 54 10/13/2021   ALT 42 10/13/2021   AST 31 10/13/2021   NA 135 10/13/2021   K 4.0 10/13/2021   CL 98 10/13/2021   CREATININE 0.88 10/13/2021   BUN 14 10/13/2021   CO2 29 10/13/2021   TSH 1.16 10/13/2021   PSA 0.60 10/13/2021   HGBA1C 6.0 10/13/2021   MICROALBUR 0.7 10/13/2021    No results found.  Assessment & Plan:   Problem List Items Addressed This Visit     SVT (supraventricular tachycardia) ([place])    Managed with low dose metoprolol and prn  cardizem.Marland Kitchen  No changes today       Prediabetes    Managed with low GI diet, weight loss and exercise.  a1c is stable .  He is  taking a statin     Lab Results  Component Value Date   HGBA1C 6.0 10/13/2021         HYPERTENSION, BENIGN    Well controlled on current regimen of micardis hct.  Has not had to use hydralazine . Renal function stable, no changes today.       Arrhythmia - Primary    Per patient,  Atrial fib detected by his Apple watch.   He has a history of PVCs and History of SVT.  CHA2DS2VASC score is 4 (Age, male, HTN,  PAD) .Marland Kitchen  Thyroid screen is normal, he is not anemic and lytes are normal.  He denies any  history of OSA .  I have ordered and reviewed a 12 lead EKG and find that HE IS NOT CURRENTLY IN ATRIAL FIB, having frequent PVS and atrial bradycardia rate 56  .  Will need ZIO monitor,which has  been ordered by t cardiology follow up.         Relevant Orders   EKG 12-Lead (Completed)   Aortic atherosclerosis ([place])    Reviewed findings of prior CT scan today..  Patient is tolerating high potency statin therapy with crestor         I spent a total of  45 minutes with this  patient in a face to face visit on the date of this encounter reviewing the last office visit with   Dr. Rockey Situ, prior EKGs.  Recent labs,   patient'sdiet and eating habits, home blood pressure readings , and post visit ordering of testing and therapeutics.    Follow-up: Return in about 6 months (around 04/22/2022).   Crecencio Mc, MD

## 2021-10-20 NOTE — Assessment & Plan Note (Signed)
Well controlled on current regimen of micardis hct.  Has not had to use hydralazine . Renal function stable, no changes today.

## 2021-10-24 ENCOUNTER — Encounter: Payer: Self-pay | Admitting: Cardiovascular Disease

## 2021-10-30 ENCOUNTER — Encounter: Payer: Self-pay | Admitting: Cardiovascular Disease

## 2021-11-02 DIAGNOSIS — I493 Ventricular premature depolarization: Secondary | ICD-10-CM | POA: Diagnosis not present

## 2021-11-10 ENCOUNTER — Encounter: Payer: Self-pay | Admitting: Cardiovascular Disease

## 2021-11-12 NOTE — Progress Notes (Deleted)
Evaluation Performed:  Follow-up visit  Date:  11/12/2021   ID:  Mark Farley., DOB 05-04-1946, MRN [id number]  Patient Location:  [address] [place] [address]   Provider location:   [facility], [place] office  PCP:  Sherlene Shams, MD  Cardiologist:  [facility]   No chief complaint on file.   History of Present Illness:    Mark Farley. is a 76 y.o. male past medical history of HTN,  hyperlipidemia previous syncope, negative Myoview in 2005. h/o palpitations and had a Life Watch monitor for several weeks but no events arthritis,  anemia from iron deficiency Previous Holter monitor showing runs of SVT, rate up to 150 bpm Elevated LFTS on crestor 10 He presents today for follow-up of his palpitations, hypertension  Last seen in clinic by myself October 2022   In follow-up today, recent travel to family, [facility] Long drive, travel, walking, Got out of his car, walked into a store, had near syncope Stood there for some time, slowly recovered  1 or 2 weeks ago, 1 episode was driving, felt dizzy, slow down the car, resolved  Episode of sweating last night while in a sitting position Have been watching TV, had just had a snack  Continues on Micardis HCTZ, also on metoprolol succinate 50 daily  Weight stable Denies anginal symptoms Blood pressure measurements reviewed ranging from 120s to 140, heart rate 60-70  On prior clinic visit we discussed episode of Syncope 07/24/20,wife heard him fall, got up too quickly Was working on the ground, got up quickly Concern for prerenal state  CT coronary calcium scoring July 2020,  Score 638, 73rd percentile Aorta with mild atherosclerosis  EKG personally reviewed by myself on todays visit Shows normal sinus rhythm rate 62 bpm no significant ST-T wave changes   Lab Results  Component Value Date   CHOL 130 10/13/2021   HDL 47.90 10/13/2021   LDLCALC  54 10/13/2021   TRIG 139.0 10/13/2021    Past medical history reviewed severe series of tachycardia episodes day after Thanksgiving 2016 Reported he was driving in a car back from a trip, tachycardia seem to happen every 5 minutes. He took several diltiazem, possibly up to 3 or 4 and eventually symptoms seem to improve.   Holter monitor showing frequent episodes of SVT, short-lived total cholesterol less than 150   Past Medical History:  Diagnosis Date   Arthritis    ? Gout   Bilateral carpal tunnel syndrome    managed with nocturnal braces x 3 years   COPD (chronic obstructive pulmonary disease) (HCC)    Dysrhythmia    Tachycardia, PVCs, SVTs   GERD (gastroesophageal reflux disease)    Glucose intolerance (impaired glucose tolerance)    Heart murmur    as a child   History of syncope 2005   Unexplained, myoview negative   Hyperlipidemia    Hypertension    Iron deficiency anemia    Osteoarthritis of multiple joints    Palpitations 2010   Life Watch with single PVC   Pre-diabetes    Past Surgical History:  Procedure Laterality Date   APPENDECTOMY     1957   COLONOSCOPY     COLONOSCOPY WITH PROPOFOL N/A 01/24/2020   Procedure: COLONOSCOPY WITH PROPOFOL;  Surgeon: Toney Reil, MD;  Location: [facility] ENDOSCOPY;  Service: Gastroenterology;  Laterality: N/A;   JOINT REPLACEMENT     TONSILLECTOMY  1952   TOTAL KNEE ARTHROPLASTY Right 03/14/2019   Procedure: RIGHT TOTAL KNEE ARTHROPLASTY;  Surgeon: Cammy Copa, MD;  [facility];  Service: Orthopedics;  Laterality: Right;   TOTAL KNEE ARTHROPLASTY Left 06/22/2019   Procedure: LEFT TOTAL KNEE ARTHROPLASTY;  Surgeon: Cammy Copa, MD;  Location: [facility] OR;  Service: Orthopedics;  Laterality: Left;     No outpatient medications have been marked as taking for the 11/14/21 encounter (Appointment) with Antonieta Iba, MD.     Allergies:   Bee venom   Social History   Tobacco Use   Smoking status:  Never   Smokeless tobacco: Never  Vaping Use   Vaping Use: Never used  Substance Use Topics   Alcohol use: Yes    Alcohol/week: 14.0 standard drinks of alcohol    Types: 14 Standard drinks or equivalent per week    Comment: 2 glasses of wine a day   Drug use: No     Family Hx: The patient's family history includes Colon cancer in his brother and paternal grandmother; Coronary artery disease in his mother; Heart attack in his mother; Heart failure in his father.  ROS:   Please see the history of present illness.    Review of Systems  HENT: Negative.    Respiratory: Negative.    Cardiovascular: Negative.   Gastrointestinal: Negative.   Musculoskeletal: Negative.   Neurological:  Positive for dizziness.  Psychiatric/Behavioral: Negative.    All other systems reviewed and are negative.    Labs/Other Tests and Data Reviewed:    Recent Labs: 10/13/2021: ALT 42; BUN 14; Creatinine, Ser 0.88; Hemoglobin 14.1; Platelets 149.0; Potassium 4.0; Sodium 135; TSH 1.16   Recent Lipid Panel Lab Results  Component Value Date/Time   CHOL 130 10/13/2021 07:42 AM   TRIG 139.0 10/13/2021 07:42 AM   TRIG 108 05/01/2009 12:00 AM   HDL 47.90 10/13/2021 07:42 AM   CHOLHDL 3 10/13/2021 07:42 AM   LDLCALC 54 10/13/2021 07:42 AM   LDLDIRECT 76.0 10/13/2021 07:42 AM    Wt Readings from Last 3 Encounters:  10/20/21 192 lb (87.1 kg)  04/21/21 197 lb 12.8 oz (89.7 kg)  03/19/21 195 lb 6 oz (88.6 kg)     Exam:    There were no vitals taken for this visit. Constitutional:  oriented to person, place, and time. No distress.  HENT:  Head: Grossly normal Eyes:  no discharge. No scleral icterus.  Neck: No JVD, no carotid bruits  Cardiovascular: Regular rate and rhythm, no murmurs appreciated Pulmonary/Chest: Clear to auscultation bilaterally, no wheezes or rails Abdominal: Soft.  no distension.  no tenderness.  Musculoskeletal: Normal range of motion Neurological:  normal muscle tone.  Coordination normal. No atrophy Skin: Skin warm and dry Psychiatric: normal affect, pleasant  ASSESSMENT & PLAN:    SVT (supraventricular tachycardia) (HCC) No significant breakthrough arrhythmia, takes diltiazem 30 mg pills as needed  HYPERTENSION, BENIGN Continued episodes of near syncope concerning for orthostasis Recommended we split the telmisartan and HCTZ Consider holding HCTZ when traveling, busy, 1 less hydrated in effort to avoid orthostasis symptoms If blood pressure runs high with periodically holding HCTZ may need additional alternate blood pressure medication Will avoid increasing beta-blocker  PVC's (premature ventricular contractions) Previously seen on monitor, continue beta-blocker, asymptomatic  Mixed hyperlipidemia Prior transaminitis, seen by GI, has had weight loss since then Tolerating rosuvastatin Cholesterol at goal  CAD with stable angina Coronary calcification score 638 on scan in 2020 Cholesterol at goal, no anginal  symptoms No further ischemic work-up  History of syncope/near syncope Prior episode of syncope early 2022, likely orthostasis Continued episodes of near syncope Recommend we split the HCTZ, hold  HCTZ for days when there is less fluid intake If symptoms persist, may need repeat ZIO monitor   Total encounter time more than 25 minutes  Greater than 50% was spent in counseling and coordination of care with the patient   Signed, Julien Nordmannimothy Lopez Dentinger, MD  11/12/2021 1:51 PM    [facility]  Office [address], [place], [place] [address]

## 2021-11-14 ENCOUNTER — Ambulatory Visit: Payer: Medicare PPO | Admitting: Cardiovascular Disease

## 2021-11-14 DIAGNOSIS — I700 Atherosclerosis of aorta: Secondary | ICD-10-CM

## 2021-11-14 DIAGNOSIS — I10 Essential (primary) hypertension: Secondary | ICD-10-CM

## 2021-11-14 DIAGNOSIS — I493 Ventricular premature depolarization: Secondary | ICD-10-CM

## 2021-11-14 DIAGNOSIS — I471 Supraventricular tachycardia: Secondary | ICD-10-CM

## 2021-11-14 DIAGNOSIS — E782 Mixed hyperlipidemia: Secondary | ICD-10-CM

## 2021-11-16 ENCOUNTER — Encounter: Payer: Self-pay | Admitting: Cardiovascular Disease

## 2021-11-20 DIAGNOSIS — I493 Ventricular premature depolarization: Secondary | ICD-10-CM | POA: Diagnosis not present

## 2021-11-27 ENCOUNTER — Telehealth: Payer: Self-pay | Admitting: Cardiovascular Disease

## 2021-11-27 NOTE — Telephone Encounter (Signed)
-----   Message from Vergia Alberts, RN sent at 11/26/2021 10:43 AM EDT ----- Regarding: Appt Please call patient to move up 9/6 appt. May use DOD slot.

## 2021-11-27 NOTE — Telephone Encounter (Signed)
Attempted to schedule sooner.  Lmov to call office.     IF PATIENT CALLS BACK OK PER BELOW TO USE A DOD SLOT FOR Mark Farley

## 2021-12-04 NOTE — Progress Notes (Signed)
Evaluation Performed:  Follow-up visit  Date:  12/05/2021   ID:  Mark Pillow., DOB 06/01/1946, MRN [id number]  Patient Location:  [address] [place] [address]   Provider location:   [facility], [place] office  PCP:  Sherlene Shams, MD  Cardiologist:  Fonnie Mu   Chief Complaint  Patient presents with   Tachycardia    Patient c/o tachycardia; thought he was in A-Fib according to his apple watch. He would like to discuss the [facility] monitor. Medications reviewed by the patient verbally.     History of Present Illness:    Mark Frisina. is a 76 y.o. male past medical history of HTN,  hyperlipidemia previous syncope, negative Myoview in 2005. h/o palpitations and had a Life Watch monitor for several weeks but no events arthritis,  anemia from iron deficiency Previous Holter monitor showing runs of SVT, rate up to 150 bpm Elevated LFTS on crestor 10 He presents today for follow-up of his palpitations, hypertension  Last seen in clinic by myself October 2022  On prior clinic visit Apple watch concerning for atrial fibrillation Zio monitor reviewed  Event monitor Normal sinus rhythm Patient had a min HR of 49 bpm, max HR of 184 bpm, and avg HR of 66 bpm.   38 Supraventricular Tachycardia/atrial tachycardia runs occurred, the run with the fastest interval lasting 5 beats with a max rate of 184 bpm, the longest lasting 18.1 secs with an avg rate of 156 bpm.     Idioventricular Rhythm was present.    Isolated SVEs were rare (<1.0%), SVE Couplets were rare (<1.0%), and SVE Triplets were rare (<1.0%). Isolated VEs were occasional (1.5%, [id number]), VE Couplets were rare (<1.0%, 109), and VE Triplets were rare (<1.0%, 43). Ventricular Bigeminy and Trigeminy were present.   4 patient triggered events noted, 2 associated with short run of narrow complex tachycardia, other triggered events with normal rhythm, PVCs  In  follow-up today concerned about elevated blood pressure often running 1 50-1 60 systolic We will periodically spend long hours in the garden, working with his bees in a bee suit, excessive perspiration Has frequent cramping in the legs, drinks pickle juice  Likely symptomatic from his SVT, PACs  EKG personally reviewed by myself on todays visit Normal sinus rhythm rate 73 bpm PAC  Other past medical history reviewed Syncope 07/24/20,wife heard him fall,got up too quickly Was working on the ground, got up quickly Concern for prerenal state  CT coronary calcium scoring July 2020,  Score 638, 73rd percentile Aorta with mild atherosclerosis   Lab Results  Component Value Date   CHOL 130 10/13/2021   HDL 47.90 10/13/2021   LDLCALC 54 10/13/2021   TRIG 139.0 10/13/2021    Past medical history reviewed severe series of tachycardia episodes day after Thanksgiving 2016 Reported he was driving in a car back from a trip, tachycardia seem to happen every 5 minutes. He took several diltiazem, possibly up to 3 or 4 and eventually symptoms seem to improve.   Holter monitor showing frequent episodes of SVT, short-lived total cholesterol less than 150   Past Medical History:  Diagnosis Date   Arthritis    ? Gout   Bilateral carpal tunnel syndrome    managed with nocturnal braces x 3 years   COPD (chronic obstructive pulmonary disease) (HCC)    Dysrhythmia    Tachycardia, PVCs, SVTs   GERD (gastroesophageal reflux disease)  Glucose intolerance (impaired glucose tolerance)    Heart murmur    as a child   History of syncope 2005   Unexplained, myoview negative   Hyperlipidemia    Hypertension    Iron deficiency anemia    Osteoarthritis of multiple joints    Palpitations 2010   Life Watch with single PVC   Pre-diabetes    Past Surgical History:  Procedure Laterality Date   APPENDECTOMY     1957   COLONOSCOPY     COLONOSCOPY WITH PROPOFOL N/A 01/24/2020   Procedure:  COLONOSCOPY WITH PROPOFOL;  Surgeon: Toney Reil, MD;  Location: [facility] ENDOSCOPY;  Service: Gastroenterology;  Laterality: N/A;   JOINT REPLACEMENT     TONSILLECTOMY     1952   TOTAL KNEE ARTHROPLASTY Right 03/14/2019   Procedure: RIGHT TOTAL KNEE ARTHROPLASTY;  Surgeon: Cammy Copa, MD;  Location: [facility] OR;  Service: Orthopedics;  Laterality: Right;   TOTAL KNEE ARTHROPLASTY Left 06/22/2019   Procedure: LEFT TOTAL KNEE ARTHROPLASTY;  Surgeon: Cammy Copa, MD;  Location: [facility] OR;  Service: Orthopedics;  Laterality: Left;     Current Meds  Medication Sig   amoxicillin (AMOXIL) 500 MG tablet Take 4 tablets by mouth (2,000 mg) 30-60 minutes prior to dental procedure.   aspirin EC 81 MG tablet Take 81 mg by mouth daily. Swallow whole.   celecoxib (CELEBREX) 100 MG capsule Take 100 mg by mouth daily.   Cyanocobalamin (VITAMIN B 12) 100 MCG LOZG Take by mouth daily.   diltiazem (CARDIZEM) 30 MG tablet Take 1 tablet (30 mg total) by mouth 4 (four) times daily as needed.   hydrALAZINE (APRESOLINE) 25 MG tablet Take 1 tablet (25 mg total) by mouth 3 (three) times daily as needed (for pressure >150).   metoprolol succinate (TOPROL-XL) 50 MG 24 hr tablet Take 1 tablet (50 mg total) by mouth daily.   omeprazole (PRILOSEC) 20 MG capsule Take 1 capsule (20 mg total) by mouth daily.   rosuvastatin (CRESTOR) 10 MG tablet Take 10 mg by mouth daily.   sildenafil (VIAGRA) 50 MG tablet Take 1 tablet (50 mg total) by mouth daily as needed for erectile dysfunction.   telmisartan-hydrochlorothiazide (MICARDIS HCT) 80-25 MG tablet Take 1 tablet by mouth daily.   timolol (TIMOPTIC) 0.5 % ophthalmic solution 1 drop 2 (two) times daily.   traMADol (ULTRAM) 50 MG tablet Take 50 mg by mouth 2 (two) times daily as needed.   valACYclovir (VALTREX) 1000 MG tablet Take 1-2 g by mouth daily as needed (cold sores/fever blisters).      Allergies:   Bee venom and Other   Social History   Tobacco Use    Smoking status: Never   Smokeless tobacco: Never  Vaping Use   Vaping Use: Never used  Substance Use Topics   Alcohol use: Yes    Alcohol/week: 14.0 standard drinks of alcohol    Types: 14 Standard drinks or equivalent per week    Comment: 2 glasses of wine a day   Drug use: No     Family Hx: The patient's family history includes Colon cancer in his brother and paternal grandmother; Coronary artery disease in his mother; Heart attack in his mother; Heart failure in his father.  ROS:   Please see the history of present illness.    Review of Systems  HENT: Negative.    Respiratory: Negative.    Cardiovascular: Negative.   Gastrointestinal: Negative.   Musculoskeletal: Negative.   Neurological:  Positive for  dizziness.  Psychiatric/Behavioral: Negative.    All other systems reviewed and are negative.    Labs/Other Tests and Data Reviewed:    Recent Labs: 10/13/2021: ALT 42; BUN 14; Creatinine, Ser 0.88; Hemoglobin 14.1; Platelets 149.0; Potassium 4.0; Sodium 135; TSH 1.16   Recent Lipid Panel Lab Results  Component Value Date/Time   CHOL 130 10/13/2021 07:42 AM   TRIG 139.0 10/13/2021 07:42 AM   TRIG 108 05/01/2009 12:00 AM   HDL 47.90 10/13/2021 07:42 AM   CHOLHDL 3 10/13/2021 07:42 AM   LDLCALC 54 10/13/2021 07:42 AM   LDLDIRECT 76.0 10/13/2021 07:42 AM    Wt Readings from Last 3 Encounters:  12/05/21 201 lb (91.2 kg)  10/20/21 192 lb (87.1 kg)  04/21/21 197 lb 12.8 oz (89.7 kg)     Exam:    BP (!) 160/90 (BP Location: Left Arm, Patient Position: Sitting, Cuff Size: Normal)   Pulse 73   Ht 5' 6.5" (1.689 m)   Wt 201 lb (91.2 kg)   SpO2 98%   BMI 31.96 kg/m  Constitutional:  oriented to person, place, and time. No distress.  HENT:  Head: Grossly normal Eyes:  no discharge. No scleral icterus.  Neck: No JVD, no carotid bruits  Cardiovascular: Regular rate and rhythm, no murmurs appreciated Pulmonary/Chest: Clear to auscultation bilaterally, no wheezes or  rails Abdominal: Soft.  no distension.  no tenderness.  Musculoskeletal: Normal range of motion Neurological:  normal muscle tone. Coordination normal. No atrophy Skin: Skin warm and dry Psychiatric: normal affect, pleasant   ASSESSMENT & PLAN:    SVT (supraventricular tachycardia) (HCC) Episodes of PACs, short runs SVT on monitor, no atrial fibrillation Blood pressure running high, Recommend he start diltiazem extended release 120 mg daily  HYPERTENSION, BENIGN Continue telmisartan current dose of metoprolol we will add diltiazem extended release 120 daily for high blood pressure in the setting of weight gain May need to hold HCTZ periodically to avoid orthostasis, dehydration We will provide separate prescriptions for telmisartan and HCTZ  PVC's (premature ventricular contractions) Previously seen on monitor, continue beta-blocker, asymptomatic PACs, SVT also noted  Mixed hyperlipidemia On Crestor, numbers at goal  CAD with stable angina Coronary calcification score 638 on scan in 2020 Cholesterol at goal, no anginal symptoms No further ischemic work-up Stable  History of syncope/near syncope Prior episode of syncope early 2022, likely orthostasis Recommend we split the HCTZ, hold  HCTZ for days when there is less fluid intake    Total encounter time more than 30 minutes  Greater than 50% was spent in counseling and coordination of care with the patient   Signed, Julien Nordmann, MD  12/05/2021 2:26 PM    [facility] [address], [place], [place] [address]

## 2021-12-05 ENCOUNTER — Ambulatory Visit (INDEPENDENT_AMBULATORY_CARE_PROVIDER_SITE_OTHER): Payer: Medicare PPO | Admitting: Cardiovascular Disease

## 2021-12-05 ENCOUNTER — Encounter: Payer: Self-pay | Admitting: Cardiovascular Disease

## 2021-12-05 VITALS — BP 160/90 | HR 73 | Ht 66.5 in | Wt 201.0 lb

## 2021-12-05 DIAGNOSIS — I471 Supraventricular tachycardia: Secondary | ICD-10-CM | POA: Diagnosis not present

## 2021-12-05 DIAGNOSIS — I10 Essential (primary) hypertension: Secondary | ICD-10-CM | POA: Diagnosis not present

## 2021-12-05 DIAGNOSIS — E782 Mixed hyperlipidemia: Secondary | ICD-10-CM | POA: Diagnosis not present

## 2021-12-05 DIAGNOSIS — I493 Ventricular premature depolarization: Secondary | ICD-10-CM | POA: Diagnosis not present

## 2021-12-05 DIAGNOSIS — I700 Atherosclerosis of aorta: Secondary | ICD-10-CM | POA: Diagnosis not present

## 2021-12-05 MED ORDER — DILTIAZEM HCL ER COATED BEADS 120 MG PO CP24: 120.0000 mg | ORAL_CAPSULE | Freq: Every day | ORAL | 3 refills | Status: DC

## 2021-12-05 MED ORDER — HYDROCHLOROTHIAZIDE 25 MG PO TABS: 25.0000 mg | ORAL_TABLET | Freq: Every day | ORAL | 3 refills | Status: DC

## 2021-12-05 MED ORDER — TELMISARTAN 80 MG PO TABS: 80.0000 mg | ORAL_TABLET | Freq: Every day | ORAL | 3 refills | Status: DC

## 2021-12-05 NOTE — Patient Instructions (Addendum)
Medication Instructions:  Please start diltiazem er 120 mg daily  We will send in separate scripts for telmisartan and HCTZ  If you need a refill on your cardiac medications before your next appointment, please call your pharmacy.   Lab work: No new labs needed  Testing/Procedures: No new testing needed  Follow-Up: At [facility], you and your health needs are our priority.  As part of our continuing mission to provide you with exceptional heart care, we have created designated Provider Care Teams.  These Care Teams include your primary Cardiologist (physician) and Advanced Practice Providers (APPs -  Physician Assistants and Nurse Practitioners) who all work together to provide you with the care you need, when you need it.  You will need a follow up appointment in 12 months  Providers on your designated Care Team:   Nicolasa Ducking, NP Eula Listen, PA-C Cadence Fransico Michael, [place]  COVID-19 Vaccine Information can be found at: [email or web address] For questions related to vaccine distribution or appointments, please email [email or web address] or call [phone].

## 2021-12-07 ENCOUNTER — Encounter: Payer: Self-pay | Admitting: Cardiovascular Disease

## 2021-12-09 ENCOUNTER — Encounter: Payer: Self-pay | Admitting: Cardiovascular Disease

## 2021-12-17 ENCOUNTER — Encounter: Payer: Self-pay | Admitting: Cardiovascular Disease

## 2021-12-19 ENCOUNTER — Other Ambulatory Visit: Payer: Self-pay | Admitting: *Deleted

## 2021-12-19 ENCOUNTER — Encounter: Payer: Self-pay | Admitting: Cardiovascular Disease

## 2022-01-16 ENCOUNTER — Other Ambulatory Visit: Payer: Self-pay

## 2022-01-16 NOTE — Patient Outreach (Signed)
[facility]) Care Management  01/16/2022  Mark Farley. Oct 19, 1945 [id number]   Telephone Screen    Outreach call to patient to introduce [facility] services and assess care needs as part of benefit of PCP office and insurance plan.No answer after multiple rings and unable to leave message.      Plan: RN CM will make outreach attempt to patient within 4 business days.   Antionette Fairy, RN,BSN,CCM [facility] Care Management Telephonic Care Management Coordinator Direct Phone: [phone] Toll Free: [phone] Fax: [phone]

## 2022-01-19 ENCOUNTER — Other Ambulatory Visit: Payer: Self-pay

## 2022-01-19 NOTE — Patient Outreach (Signed)
[facility]) Care Management  01/19/2022  Mark Farley. March 06, 1946 [id number]   Telephone Screen       Unsuccessful outreach call to patient to introduce [name] services and assess care needs as part of benefit of PCP office and insurance plan.      Plan: RN CM will make outreach attempt to patient within 4 business days.  Antionette Fairy, RN,BSN,CCM [facility] Care Management Telephonic Care Management Coordinator Direct Phone: [phone] Toll Free: [phone] Fax: [phone]

## 2022-01-21 ENCOUNTER — Other Ambulatory Visit: Payer: Self-pay

## 2022-01-21 NOTE — Patient Outreach (Signed)
[facility]) Care Management  01/21/2022  Mark Farley. February 23, 1946 [id number]   Telephone Screen      Multiple unsuccessful attempts to outreach patient to introduce [facility] services and assess care needs as part of benefit of PCP office and insurance plan.      Plan: RN CM will close case.  Antionette Fairy, RN,BSN,CCM [facility] Care Management Telephonic Care Management Coordinator Direct Phone: [phone] Toll Free: [phone] Fax: [phone]

## 2022-02-04 ENCOUNTER — Ambulatory Visit: Payer: Medicare PPO | Admitting: Cardiovascular Disease

## 2022-02-24 ENCOUNTER — Ambulatory Visit (INDEPENDENT_AMBULATORY_CARE_PROVIDER_SITE_OTHER): Payer: Medicare PPO | Admitting: Internal Medicine

## 2022-02-24 ENCOUNTER — Ambulatory Visit (INDEPENDENT_AMBULATORY_CARE_PROVIDER_SITE_OTHER): Payer: Medicare PPO

## 2022-02-24 VITALS — BP 144/74 | HR 66 | Temp 97.5°F | Ht 66.5 in | Wt 198.2 lb

## 2022-02-24 DIAGNOSIS — I700 Atherosclerosis of aorta: Secondary | ICD-10-CM | POA: Diagnosis not present

## 2022-02-24 DIAGNOSIS — M461 Sacroiliitis, not elsewhere classified: Secondary | ICD-10-CM

## 2022-02-24 DIAGNOSIS — M545 Low back pain, unspecified: Secondary | ICD-10-CM

## 2022-02-24 DIAGNOSIS — Z23 Encounter for immunization: Secondary | ICD-10-CM | POA: Diagnosis not present

## 2022-02-24 DIAGNOSIS — I471 Supraventricular tachycardia: Secondary | ICD-10-CM

## 2022-02-24 DIAGNOSIS — I10 Essential (primary) hypertension: Secondary | ICD-10-CM | POA: Diagnosis not present

## 2022-02-24 DIAGNOSIS — M533 Sacrococcygeal disorders, not elsewhere classified: Secondary | ICD-10-CM | POA: Diagnosis not present

## 2022-02-24 DIAGNOSIS — M47816 Spondylosis without myelopathy or radiculopathy, lumbar region: Secondary | ICD-10-CM | POA: Diagnosis not present

## 2022-02-24 MED ORDER — PREDNISONE 10 MG PO TABS: ORAL_TABLET | ORAL | 0 refills | Status: DC

## 2022-02-24 NOTE — Assessment & Plan Note (Addendum)
Plain films ordered of lumbar spine and sacrum to evaluate for disk herniation and SI joint fracture. .  Prednisone taper to start in 72 hours.  Ok to use celebrex at incrased dose 200 mg bid until then

## 2022-02-24 NOTE — Assessment & Plan Note (Signed)
Atrial fib ruled out.  PSVT now controlled with diltiazem CD 120 mg daily.  No fluid retention

## 2022-02-24 NOTE — Assessment & Plan Note (Signed)
Home readings have been < 140/90 on current regimen of telmisartan hct and diltiazem.

## 2022-02-24 NOTE — Assessment & Plan Note (Signed)
Reviewed findings of prior CT scan today..  Patient is tolerating high potency statin therapy with crestor

## 2022-02-24 NOTE — Progress Notes (Signed)
Subjective:  Patient ID: Mark Pillow., male    DOB: 1945-09-16  Age: 76 y.o. MRN: [id number]  CC: Diagnoses of Low back pain associated with a spinal disorder other than radiculopathy or spinal stenosis, Aortic atherosclerosis (HCC), SI (sacroiliac) joint inflammation (HCC), SVT (supraventricular tachycardia) (HCC), and HYPERTENSION, BENIGN were pertinent to this visit.   HPI Mark Furness Corky Sing. presents for  Chief Complaint  Patient presents with   Acute Visit    Lower back pain x 1 week. Does not recall any injury.    1) low back pain started in the left SI joint  aggravated by going down stairs,  not occurrimg during sleep.  Not bothered by yard work. Started during his cruise last week.   No lateral  hip pain , but the si pain does occasionally radiate to left knee .  Has been walking ,  gardening a lot.   Using celebrex once daily,  tramadol twice daily.   Also noted during the cruise that his left arm was painful from the scapula to the finger relieved by letting arm hang  aggravated by liftng arm .  But resolved once the back started  hurting   2) SVT:  atrial fib ruled out,  diltiazem 120 mg daily started in May by Dr. Mariah Milling   3) HTN: taking telmisartan , hct and diltiazem.  Home reaingss have been 130/80 or less   Outpatient Medications Prior to Visit  Medication Sig Dispense Refill   amoxicillin (AMOXIL) 500 MG tablet Take 4 tablets by mouth (2,000 mg) 30-60 minutes prior to dental procedure. 10 tablet 0   aspirin EC 81 MG tablet Take 81 mg by mouth daily. Swallow whole.     celecoxib (CELEBREX) 100 MG capsule Take 100 mg by mouth daily.     Cyanocobalamin (VITAMIN B 12) 100 MCG LOZG Take by mouth daily.     diltiazem (CARDIZEM CD) 120 MG 24 hr capsule Take 1 capsule (120 mg total) by mouth daily. 90 capsule 3   hydrALAZINE (APRESOLINE) 25 MG tablet Take 1 tablet (25 mg total) by mouth 3 (three) times daily as needed (for pressure >150). 90 tablet 3    hydrochlorothiazide (HYDRODIURIL) 25 MG tablet Take 1 tablet (25 mg total) by mouth daily. 90 tablet 3   metoprolol succinate (TOPROL-XL) 50 MG 24 hr tablet Take 1 tablet (50 mg total) by mouth daily. 90 tablet 3   omeprazole (PRILOSEC) 20 MG capsule Take 1 capsule (20 mg total) by mouth daily. 90 capsule 3   rosuvastatin (CRESTOR) 10 MG tablet Take 10 mg by mouth daily.     sildenafil (VIAGRA) 50 MG tablet Take 1 tablet (50 mg total) by mouth daily as needed for erectile dysfunction. 30 tablet 5   telmisartan (MICARDIS) 80 MG tablet Take 1 tablet (80 mg total) by mouth daily. 90 tablet 3   timolol (TIMOPTIC) 0.5 % ophthalmic solution 1 drop 2 (two) times daily.     traMADol (ULTRAM) 50 MG tablet Take 50 mg by mouth 2 (two) times daily as needed.     valACYclovir (VALTREX) 1000 MG tablet Take 1-2 g by mouth daily as needed (cold sores/fever blisters).      diltiazem (CARDIZEM) 30 MG tablet Take 1 tablet (30 mg total) by mouth 4 (four) times daily as needed. (Patient not taking: Reported on 02/24/2022) 90 tablet 3   diltiazem (CARDIZEM CD) 120 MG 24 hr capsule Take 1 capsule (120 mg total) by mouth daily. (  Patient not taking: Reported on 02/24/2022) 30 capsule 3   No facility-administered medications prior to visit.    Review of Systems;  Patient denies headache, fevers, malaise, unintentional weight loss, skin rash, eye pain, sinus congestion and sinus pain, sore throat, dysphagia,  hemoptysis , cough, dyspnea, wheezing, chest pain, palpitations, orthopnea, edema, abdominal pain, nausea, melena, diarrhea, constipation, flank pain, dysuria, hematuria, urinary  Frequency, nocturia, numbness, tingling, seizures,  Focal weakness, Loss of consciousness,  Tremor, insomnia, depression, anxiety, and suicidal ideation.      Objective:  BP (!) 144/74 (BP Location: Left Arm, Patient Position: Sitting, Cuff Size: Normal)   Pulse 66   Temp (!) 97.5 F (36.4 C) (Oral)   Ht 5' 6.5" (1.689 m)   Wt 198 lb  3.2 oz (89.9 kg)   SpO2 96%   BMI 31.51 kg/m   BP Readings from Last 3 Encounters:  02/24/22 (!) 144/74  12/05/21 (!) 160/90  10/20/21 126/76    Wt Readings from Last 3 Encounters:  02/24/22 198 lb 3.2 oz (89.9 kg)  12/05/21 201 lb (91.2 kg)  10/20/21 192 lb (87.1 kg)    General appearance: alert, cooperative and appears stated age Ears: normal TM's and external ear canals both ears Throat: lips, mucosa, and tongue normal; teeth and gums normal Neck: no adenopathy, no carotid bruit, supple, symmetrical, trachea midline and thyroid not enlarged, symmetric, no tenderness/mass/nodules Back: symmetric, no curvature. ROM normal. No CVA tenderness. Lungs: clear to auscultation bilaterally Heart: regular rate and rhythm, S1, S2 normal, no murmur, click, rub or gallop Abdomen: soft, non-tender; bowel sounds normal; no masses,  no organomegaly Pulses: 2+ and symmetric Skin: Skin color, texture, turgor normal. No rashes or lesions Lymph nodes: Cervical, supraclavicular, and axillary nodes normal. Neuro:  awake and interactive with normal mood and affect. Higher cortical functions are normal. Speech is clear without word-finding difficulty or dysarthria. Extraocular movements are intact. Visual fields of both eyes are grossly intact. Sensation to light touch is grossly intact bilaterally of upper and lower extremities. Motor examination shows 5/5 symmetric hand grip and upper extremity and 5/5 lower extremity strength.  patellar DTRS are absent (bilateral TKR )  but  achilles DTRS are 2 + bilaterally.   There is no pronation or drift. Gait is non-ataxic   Lab Results  Component Value Date   HGBA1C 6.0 10/13/2021   HGBA1C 6.1 04/18/2021   HGBA1C 6.0 04/18/2020    Lab Results  Component Value Date   CREATININE 0.88 10/13/2021   CREATININE 1.04 04/18/2021   CREATININE 0.85 04/18/2020    Lab Results  Component Value Date   WBC 5.1 10/13/2021   HGB 14.1 10/13/2021   HCT 41.4  10/13/2021   PLT 149.0 (L) 10/13/2021   GLUCOSE 105 (H) 10/13/2021   CHOL 130 10/13/2021   TRIG 139.0 10/13/2021   HDL 47.90 10/13/2021   LDLDIRECT 76.0 10/13/2021   LDLCALC 54 10/13/2021   ALT 42 10/13/2021   AST 31 10/13/2021   NA 135 10/13/2021   K 4.0 10/13/2021   CL 98 10/13/2021   CREATININE 0.88 10/13/2021   BUN 14 10/13/2021   CO2 29 10/13/2021   TSH 1.16 10/13/2021   PSA 0.60 10/13/2021   HGBA1C 6.0 10/13/2021   MICROALBUR 0.7 10/13/2021    No results found.  Assessment & Plan:   Problem List Items Addressed This Visit     HYPERTENSION, BENIGN    Home readings have been < 140/90 on current regimen of telmisartan hct  and diltiazem.       SVT (supraventricular tachycardia) (HCC)    Atrial fib ruled out.  PSVT now controlled with diltiazem CD 120 mg daily.  No fluid retention       Aortic atherosclerosis (HCC)    Reviewed findings of prior CT scan today..  Patient is tolerating high potency statin therapy with crestor       Low back pain associated with a spinal disorder other than radiculopathy or spinal stenosis   Relevant Medications   predniSONE (DELTASONE) 10 MG tablet   Other Relevant Orders   DG Sacrum/Coccyx   DG Lumbar Spine Complete   SI (sacroiliac) joint inflammation (HCC)    Plain films ordered of lumbar spine and sacrum to evaluate for disk herniation and SI joint fracture. .  Prednisone taper to start in 72 hours.  Ok to use celebrex at incrased dose 200 mg bid until then       Relevant Medications   predniSONE (DELTASONE) 10 MG tablet    I spent a total of  28 minutes with this patient in a face to face visit on the date of this encounter reviewing the last office visit with me in  ay, his  most recent visit with Dr. Rockey Situ his cardiologist. patient's activity level and exercise habits, home blood pressure readings,  prior lumbar spine films  ,   and post visit ordering of testing and therapeutics.    Follow-up: No follow-ups on  file.   Crecencio Mc, MD

## 2022-02-24 NOTE — Patient Instructions (Addendum)
DO NOT COMBINE IBUPROFEN WITH CELEBREX .  Too similar and harder on the stomach   FOR THE NEXT 72 HOURS,  INCREASE CELEBREX TO 200 MG TWICE DAILY (we are postponing the prednisone so your flu vaccine will be appropriately used by the immune system)  STARTING ON FRIDAY, TAKE Prednisone taper for 6 days  INSTEAD OF CELEBREX  ONCE FINISHED IF YOU ARE STILL IN PAIN INCREASE CELEBREX TO 200 MG TWICE DAILY  FOR 7 DAYS. THEN RESUME 100 MG ONCE DAILY THEREAFTER

## 2022-02-26 ENCOUNTER — Encounter: Payer: Self-pay | Admitting: Internal Medicine

## 2022-02-26 DIAGNOSIS — M545 Low back pain, unspecified: Secondary | ICD-10-CM

## 2022-02-26 DIAGNOSIS — M461 Sacroiliitis, not elsewhere classified: Secondary | ICD-10-CM

## 2022-03-03 NOTE — Addendum Note (Signed)
Addended by: Crecencio Mc on: 03/03/2022 01:00 PM   Modules accepted: Orders

## 2022-03-09 ENCOUNTER — Encounter: Payer: Self-pay | Admitting: Internal Medicine

## 2022-03-09 DIAGNOSIS — M545 Low back pain, unspecified: Secondary | ICD-10-CM

## 2022-03-09 DIAGNOSIS — M461 Sacroiliitis, not elsewhere classified: Secondary | ICD-10-CM

## 2022-03-25 DIAGNOSIS — M5416 Radiculopathy, lumbar region: Secondary | ICD-10-CM | POA: Diagnosis not present

## 2022-03-25 DIAGNOSIS — M461 Sacroiliitis, not elsewhere classified: Secondary | ICD-10-CM | POA: Diagnosis not present

## 2022-03-25 DIAGNOSIS — M47816 Spondylosis without myelopathy or radiculopathy, lumbar region: Secondary | ICD-10-CM | POA: Diagnosis not present

## 2022-03-25 DIAGNOSIS — M533 Sacrococcygeal disorders, not elsewhere classified: Secondary | ICD-10-CM | POA: Diagnosis not present

## 2022-04-03 ENCOUNTER — Other Ambulatory Visit: Payer: Self-pay | Admitting: Student

## 2022-04-03 DIAGNOSIS — M47816 Spondylosis without myelopathy or radiculopathy, lumbar region: Secondary | ICD-10-CM

## 2022-04-03 DIAGNOSIS — M4807 Spinal stenosis, lumbosacral region: Secondary | ICD-10-CM

## 2022-04-03 DIAGNOSIS — M5416 Radiculopathy, lumbar region: Secondary | ICD-10-CM

## 2022-04-12 ENCOUNTER — Encounter: Payer: Self-pay | Admitting: Internal Medicine

## 2022-04-12 DIAGNOSIS — I10 Essential (primary) hypertension: Secondary | ICD-10-CM

## 2022-04-12 DIAGNOSIS — E782 Mixed hyperlipidemia: Secondary | ICD-10-CM

## 2022-04-12 DIAGNOSIS — R7303 Prediabetes: Secondary | ICD-10-CM

## 2022-04-13 ENCOUNTER — Ambulatory Visit
Admission: RE | Admit: 2022-04-13 | Discharge: 2022-04-13 | Disposition: A | Discharge status: Home / Self Care | Payer: Medicare PPO | Source: Ambulatory Visit | Attending: Student | Admitting: Student

## 2022-04-13 DIAGNOSIS — M4807 Spinal stenosis, lumbosacral region: Secondary | ICD-10-CM | POA: Diagnosis not present

## 2022-04-13 DIAGNOSIS — M47816 Spondylosis without myelopathy or radiculopathy, lumbar region: Secondary | ICD-10-CM | POA: Diagnosis not present

## 2022-04-13 DIAGNOSIS — M5416 Radiculopathy, lumbar region: Secondary | ICD-10-CM

## 2022-04-13 DIAGNOSIS — M48061 Spinal stenosis, lumbar region without neurogenic claudication: Secondary | ICD-10-CM | POA: Diagnosis not present

## 2022-04-15 DIAGNOSIS — M5442 Lumbago with sciatica, left side: Secondary | ICD-10-CM | POA: Diagnosis not present

## 2022-04-16 DIAGNOSIS — D2271 Melanocytic nevi of right lower limb, including hip: Secondary | ICD-10-CM | POA: Diagnosis not present

## 2022-04-16 DIAGNOSIS — D2261 Melanocytic nevi of right upper limb, including shoulder: Secondary | ICD-10-CM | POA: Diagnosis not present

## 2022-04-16 DIAGNOSIS — D225 Melanocytic nevi of trunk: Secondary | ICD-10-CM | POA: Diagnosis not present

## 2022-04-16 DIAGNOSIS — D2262 Melanocytic nevi of left upper limb, including shoulder: Secondary | ICD-10-CM | POA: Diagnosis not present

## 2022-04-16 DIAGNOSIS — L821 Other seborrheic keratosis: Secondary | ICD-10-CM | POA: Diagnosis not present

## 2022-04-16 DIAGNOSIS — D2272 Melanocytic nevi of left lower limb, including hip: Secondary | ICD-10-CM | POA: Diagnosis not present

## 2022-04-17 ENCOUNTER — Other Ambulatory Visit (INDEPENDENT_AMBULATORY_CARE_PROVIDER_SITE_OTHER): Payer: Medicare PPO

## 2022-04-17 DIAGNOSIS — I10 Essential (primary) hypertension: Secondary | ICD-10-CM | POA: Diagnosis not present

## 2022-04-17 DIAGNOSIS — R7303 Prediabetes: Secondary | ICD-10-CM | POA: Diagnosis not present

## 2022-04-17 DIAGNOSIS — E782 Mixed hyperlipidemia: Secondary | ICD-10-CM | POA: Diagnosis not present

## 2022-04-17 LAB — LIPID PANEL
Cholesterol: 131 mg/dL (ref 0–200)
HDL: 45.5 mg/dL (ref >39.00)
NonHDL: 85.95
Total CHOL/HDL Ratio: 3
Triglycerides: 205 mg/dL — ABNORMAL HIGH (ref 0.0–149.0)
VLDL: 41 mg/dL — ABNORMAL HIGH (ref 0.0–40.0)

## 2022-04-17 LAB — COMPREHENSIVE METABOLIC PANEL
ALT: 65 U/L — ABNORMAL HIGH (ref 0–53)
AST: 53 U/L — ABNORMAL HIGH (ref 0–37)
Albumin: 4.4 g/dL (ref 3.5–5.2)
Alkaline Phosphatase: 54 U/L (ref 39–117)
BUN: 15 mg/dL (ref 6–23)
CO2: 26 mEq/L (ref 19–32)
Calcium: 8.7 mg/dL (ref 8.4–10.5)
Chloride: 103 mEq/L (ref 96–112)
Creatinine, Ser: 0.91 mg/dL (ref 0.40–1.50)
GFR: 81.8 mL/min (ref >60.00)
Glucose, Bld: 113 mg/dL — ABNORMAL HIGH (ref 70–99)
Potassium: 4.2 mEq/L (ref 3.5–5.1)
Sodium: 136 mEq/L (ref 135–145)
Total Bilirubin: 1.4 mg/dL — ABNORMAL HIGH (ref 0.2–1.2)
Total Protein: 6.7 g/dL (ref 6.0–8.3)

## 2022-04-17 LAB — LDL CHOLESTEROL, DIRECT: Direct LDL: 72 mg/dL

## 2022-04-17 LAB — HEMOGLOBIN A1C: Hgb A1c MFr Bld: 6.3 % (ref 4.6–6.5)

## 2022-04-22 ENCOUNTER — Ambulatory Visit: Payer: Medicare PPO | Admitting: Internal Medicine

## 2022-04-28 DIAGNOSIS — M5442 Lumbago with sciatica, left side: Secondary | ICD-10-CM | POA: Diagnosis not present

## 2022-04-29 ENCOUNTER — Encounter: Payer: Self-pay | Admitting: Internal Medicine

## 2022-04-29 ENCOUNTER — Ambulatory Visit (INDEPENDENT_AMBULATORY_CARE_PROVIDER_SITE_OTHER): Payer: Medicare PPO | Admitting: Internal Medicine

## 2022-04-29 VITALS — BP 127/76 | HR 58 | Temp 97.8°F | Ht 66.5 in | Wt 198.8 lb

## 2022-04-29 DIAGNOSIS — I10 Essential (primary) hypertension: Secondary | ICD-10-CM | POA: Diagnosis not present

## 2022-04-29 DIAGNOSIS — M5416 Radiculopathy, lumbar region: Secondary | ICD-10-CM | POA: Diagnosis not present

## 2022-04-29 DIAGNOSIS — K760 Fatty (change of) liver, not elsewhere classified: Secondary | ICD-10-CM

## 2022-04-29 DIAGNOSIS — R350 Frequency of micturition: Secondary | ICD-10-CM

## 2022-04-29 DIAGNOSIS — R748 Abnormal levels of other serum enzymes: Secondary | ICD-10-CM

## 2022-04-29 DIAGNOSIS — N3289 Other specified disorders of bladder: Secondary | ICD-10-CM

## 2022-04-29 DIAGNOSIS — M47816 Spondylosis without myelopathy or radiculopathy, lumbar region: Secondary | ICD-10-CM | POA: Diagnosis not present

## 2022-04-29 DIAGNOSIS — M4807 Spinal stenosis, lumbosacral region: Secondary | ICD-10-CM | POA: Diagnosis not present

## 2022-04-29 LAB — URINALYSIS, ROUTINE W REFLEX MICROSCOPIC
Bilirubin Urine: NEGATIVE
Hgb urine dipstick: NEGATIVE
Ketones, ur: NEGATIVE
Leukocytes,Ua: NEGATIVE
Nitrite: NEGATIVE
RBC / HPF: NONE SEEN (ref 0–?)
Specific Gravity, Urine: <=1.005 — AB (ref 1.000–1.030)
Total Protein, Urine: NEGATIVE
Urine Glucose: NEGATIVE
Urobilinogen, UA: 0.2 (ref 0.0–1.0)
WBC, UA: NONE SEEN (ref 0–?)
pH: 7 (ref 5.0–8.0)

## 2022-04-29 NOTE — Assessment & Plan Note (Signed)
Liver enzmyes elevated   weill ssuspend celebrex and increase tramadol for pain management of sciatica

## 2022-04-29 NOTE — Progress Notes (Unsigned)
Subjective:  Patient ID: Mark Farley., male    DOB: December 05, 1945  Age: 76 y.o. MRN: [id number]  CC: There were no encounter diagnoses.   HPI Petr Bontempo Corky Sing. presents for  Chief Complaint  Patient presents with   Follow-up    Follow up on hypertension and hyperlipidemia    1) Prediabetes  Lab Results  Component Value Date   HGBA1C 6.3 04/17/2022     2) HTN: not TAKING HCTZ  currently due to frequent urination .  Taking TELMISARTAN , DILTIAZEM 120 MG AND TOPROL XL 50 MG DAILY  . Home readings  HOME READINGS 127/76 LAST MONTH.    3) HLD: LDL at goal  4) elevated liver enzymes.  Has fatty liver ,  taking celebrex 100 mg daily   5) Sciatica , left sided to popliteal fossa  but not below. However the balls of feet feel "numb" no leg weakness,   Left sciatic notch pain.  MRI done by [facility].  ESI planned l4-5 neural foraminal stenosis,  severe .  Taking PT as well.   Using celebrex 100 mg daily . And tramadol every 12 hours with pain at a 3/10 level.  ( History of cortisone injection in hip did not help,  so MRI was done)   6)frequent urination for the past several months with strong urge .  Voiding every hour during the day,  and nocturnal voids just one per night.  Bladder appearing distended   Lab Results  Component Value Date   PSA 0.60 10/13/2021   PSA 0.60 01/04/2020   PSA 0.79 04/10/2019      Outpatient Medications Prior to Visit  Medication Sig Dispense Refill   amoxicillin (AMOXIL) 500 MG tablet Take 4 tablets by mouth (2,000 mg) 30-60 minutes prior to dental procedure. 10 tablet 0   aspirin EC 81 MG tablet Take 81 mg by mouth daily. Swallow whole.     celecoxib (CELEBREX) 100 MG capsule Take 100 mg by mouth daily.     Cyanocobalamin (VITAMIN B 12) 100 MCG LOZG Take by mouth daily.     diltiazem (CARDIZEM CD) 120 MG 24 hr capsule Take 1 capsule (120 mg total) by mouth daily. 90 capsule 3   diltiazem (CARDIZEM) 30 MG tablet Take 1 tablet (30 mg  total) by mouth 4 (four) times daily as needed. 90 tablet 3   hydrALAZINE (APRESOLINE) 25 MG tablet Take 1 tablet (25 mg total) by mouth 3 (three) times daily as needed (for pressure >150). 90 tablet 3   hydrochlorothiazide (HYDRODIURIL) 25 MG tablet Take 1 tablet (25 mg total) by mouth daily. 90 tablet 3   metoprolol succinate (TOPROL-XL) 50 MG 24 hr tablet Take 1 tablet (50 mg total) by mouth daily. 90 tablet 3   omeprazole (PRILOSEC) 20 MG capsule Take 1 capsule (20 mg total) by mouth daily. 90 capsule 3   rosuvastatin (CRESTOR) 10 MG tablet Take 10 mg by mouth daily.     sildenafil (VIAGRA) 50 MG tablet Take 1 tablet (50 mg total) by mouth daily as needed for erectile dysfunction. 30 tablet 5   telmisartan (MICARDIS) 80 MG tablet Take 1 tablet (80 mg total) by mouth daily. 90 tablet 3   timolol (TIMOPTIC) 0.5 % ophthalmic solution 1 drop 2 (two) times daily.     traMADol (ULTRAM) 50 MG tablet Take 50 mg by mouth 2 (two) times daily as needed.     valACYclovir (VALTREX) 1000 MG tablet Take 1-2 g  by mouth daily as needed (cold sores/fever blisters).      predniSONE (DELTASONE) 10 MG tablet 6 tablets on Day 1 , then reduce by 1 tablet daily until gone (Patient not taking: Reported on 04/29/2022) 21 tablet 0   No facility-administered medications prior to visit.    Review of Systems;  Patient denies headache, fevers, malaise, unintentional weight loss, skin rash, eye pain, sinus congestion and sinus pain, sore throat, dysphagia,  hemoptysis , cough, dyspnea, wheezing, chest pain, palpitations, orthopnea, edema, abdominal pain, nausea, melena, diarrhea, constipation, flank pain, dysuria, hematuria, urinary  Frequency, nocturia, numbness, tingling, seizures,  Focal weakness, Loss of consciousness,  Tremor, insomnia, depression, anxiety, and suicidal ideation.      Objective:  BP (!) 144/86   Pulse (!) 58   Temp 97.8 F (36.6 C) (Oral)   Ht 5' 6.5" (1.689 m)   Wt 198 lb 12.8 oz (90.2 kg)    SpO2 96%   BMI 31.61 kg/m   BP Readings from Last 3 Encounters:  04/29/22 (!) 144/86  02/24/22 (!) 144/74  12/05/21 (!) 160/90    Wt Readings from Last 3 Encounters:  04/29/22 198 lb 12.8 oz (90.2 kg)  02/24/22 198 lb 3.2 oz (89.9 kg)  12/05/21 201 lb (91.2 kg)    General appearance: alert, cooperative and appears stated age Ears: normal TM's and external ear canals both ears Throat: lips, mucosa, and tongue normal; teeth and gums normal Neck: no adenopathy, no carotid bruit, supple, symmetrical, trachea midline and thyroid not enlarged, symmetric, no tenderness/mass/nodules Back: symmetric, no curvature. ROM normal. No CVA tenderness. Lungs: clear to auscultation bilaterally Heart: regular rate and rhythm, S1, S2 normal, no murmur, click, rub or gallop Abdomen: soft, non-tender; bowel sounds normal; no masses,  no organomegaly Pulses: 2+ and symmetric Skin: Skin color, texture, turgor normal. No rashes or lesions Lymph nodes: Cervical, supraclavicular, and axillary nodes normal. Neuro:  awake and interactive with normal mood and affect. Higher cortical functions are normal. Speech is clear without word-finding difficulty or dysarthria. Extraocular movements are intact. Visual fields of both eyes are grossly intact. Sensation to light touch is grossly intact bilaterally of upper and lower extremities. Motor examination shows 4+/5 symmetric hand grip and upper extremity and 5/5 lower extremity strength. There is no pronation or drift. Gait is non-ataxic   Lab Results  Component Value Date   HGBA1C 6.3 04/17/2022   HGBA1C 6.0 10/13/2021   HGBA1C 6.1 04/18/2021    Lab Results  Component Value Date   CREATININE 0.91 04/17/2022   CREATININE 0.88 10/13/2021   CREATININE 1.04 04/18/2021    Lab Results  Component Value Date   WBC 5.1 10/13/2021   HGB 14.1 10/13/2021   HCT 41.4 10/13/2021   PLT 149.0 (L) 10/13/2021   GLUCOSE 113 (H) 04/17/2022   CHOL 131 04/17/2022   TRIG  205.0 (H) 04/17/2022   HDL 45.50 04/17/2022   LDLDIRECT 72.0 04/17/2022   LDLCALC 54 10/13/2021   ALT 65 (H) 04/17/2022   AST 53 (H) 04/17/2022   NA 136 04/17/2022   K 4.2 04/17/2022   CL 103 04/17/2022   CREATININE 0.91 04/17/2022   BUN 15 04/17/2022   CO2 26 04/17/2022   TSH 1.16 10/13/2021   PSA 0.60 10/13/2021   HGBA1C 6.3 04/17/2022   MICROALBUR 0.7 10/13/2021    MR LUMBAR SPINE WO CONTRAST  Result Date: 04/14/2022 CLINICAL DATA:  Spinal stenosis of lumbosacral region. Lumbar spondylosis. Lumbar radiculopathy. Low back and buttock pain. EXAM:  MRI LUMBAR SPINE WITHOUT CONTRAST TECHNIQUE: Multiplanar, multisequence MR imaging of the lumbar spine was performed. No intravenous contrast was administered. COMPARISON:  Lumbar spine radiographs 02/24/2022 FINDINGS: Segmentation:  Standard. Alignment:  Minimal retrolisthesis of L1 on L2 and L2 on L3. Vertebrae: No fracture, suspicious marrow lesion, or significant marrow edema. Small T12 and L2 superior endplate Schmorl's nodes. Conus medullaris and cauda equina: Conus extends to the T12-L1 level. Conus and cauda equina appear normal. Paraspinal and other soft tissues: Prominent distension of the bladder. Disc levels: Disc desiccation throughout the lumbar spine. Moderate disc space narrowing at L1-2 and L2-3 and mild narrowing at L3-4. Diffuse congenital narrowing of the lumbar spinal canal due to short pedicles. T11-12: Only imaged sagittally. Mild disc bulging, small right paracentral disc protrusion, and moderate facet arthrosis without evidence of significant stenosis. T12-L1: Negative. L1-2: Disc bulging and mild facet and ligamentum flavum hypertrophy result in mild spinal stenosis, mild right lateral recess stenosis, and mild right greater than left neural foraminal stenosis. L2-3: Disc bulging and moderate facet and ligamentum flavum hypertrophy result in moderate spinal stenosis, mild-to-moderate left greater than right lateral recess  stenosis, and mild-to-moderate bilateral neural foraminal stenosis. L3-4: Disc bulging and moderate to severe facet and ligamentum flavum hypertrophy result in moderate spinal stenosis, mild-to-moderate bilateral lateral recess stenosis, and moderate right and moderate to severe left neural foraminal stenosis. L4-5: Left eccentric disc bulging and severe left greater than right facet and ligamentum flavum hypertrophy result in moderate spinal stenosis, moderate right and severe left lateral recess stenosis, and severe left greater than right neural foraminal stenosis. Left facet joint effusion. Potential bilateral L4 and L5 nerve root impingement. L5-S1: Disc bulging, a left foraminal disc protrusion, and mild facet hypertrophy result in mild right and severe left neural foraminal stenosis with potential left L5 nerve root impingement. There is a small central disc protrusion without significant spinal stenosis. IMPRESSION: 1. 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 2. Severe left greater than right neural foraminal and lateral recess stenosis at L4-5. 3. Severe left neural foraminal stenosis at L5-S1. 4. Moderate to severe neural foraminal stenosis at L3-4. Electronically Signed   By: Sebastian Ache M.D.   On: 04/14/2022 16:47    Assessment & Plan:   Problem List Items Addressed This Visit   None   I spent a total of   minutes with this patient in a face to face visit on the date of this encounter reviewing the last office visit with me in       ,  most recent visit with cardiology ,    ,  patient's diet and exercise habits, home blood pressure /blod sugar readings, recent ER visit including labs and imaging studies ,   and post visit ordering of testing and therapeutics.    Follow-up: No follow-ups on file.   Sherlene Shams, MD

## 2022-04-29 NOTE — Patient Instructions (Signed)
SUSPEND THE CELEBREX  OK TO INCREASE TRAMADOL TO 4 TIMES DAILY IF NEEDED     RETURN IN 1 WEEK FOR REPEAT LIVER ENZYMES  ULTRASOUND  OF BLADDER TO BE SCHEDULE   RESUME HCTZ AND GOAL BP IS 130/80 OR LESS (120/70 IS THE LOWEST YOU WILL PROBABLY TOLERATE)

## 2022-04-30 ENCOUNTER — Encounter: Payer: Self-pay | Admitting: Internal Medicine

## 2022-04-30 DIAGNOSIS — R748 Abnormal levels of other serum enzymes: Secondary | ICD-10-CM | POA: Insufficient documentation

## 2022-04-30 DIAGNOSIS — N3289 Other specified disorders of bladder: Secondary | ICD-10-CM | POA: Insufficient documentation

## 2022-04-30 LAB — URINE CULTURE
MICRO NUMBER:: 14246201
Result:: NO GROWTH
SPECIMEN QUALITY:: ADEQUATE

## 2022-04-30 NOTE — Assessment & Plan Note (Addendum)
Home readings have been < 130/90 on current regimen of telmisartan , metoprolol  and diltiazem.

## 2022-04-30 NOTE — Assessment & Plan Note (Signed)
With frequent urination.  Checking for UTI and urinary retention. Post void residual /ultrasound ordered.

## 2022-04-30 NOTE — Assessment & Plan Note (Signed)
History of fatty liver,  will suspend celebrex and repeat labs in 1 week  Lab Results  Component Value Date   ALT 65 (H) 04/17/2022   AST 53 (H) 04/17/2022   ALKPHOS 54 04/17/2022   BILITOT 1.4 (H) 04/17/2022   s

## 2022-05-04 DIAGNOSIS — M5442 Lumbago with sciatica, left side: Secondary | ICD-10-CM | POA: Diagnosis not present

## 2022-05-07 ENCOUNTER — Other Ambulatory Visit (INDEPENDENT_AMBULATORY_CARE_PROVIDER_SITE_OTHER): Payer: Medicare PPO

## 2022-05-07 ENCOUNTER — Encounter: Payer: Self-pay | Admitting: Cardiovascular Disease

## 2022-05-07 DIAGNOSIS — R748 Abnormal levels of other serum enzymes: Secondary | ICD-10-CM | POA: Diagnosis not present

## 2022-05-07 DIAGNOSIS — E782 Mixed hyperlipidemia: Secondary | ICD-10-CM

## 2022-05-07 LAB — COMPREHENSIVE METABOLIC PANEL
ALT: 65 U/L — ABNORMAL HIGH (ref 0–53)
AST: 55 U/L — ABNORMAL HIGH (ref 0–37)
Albumin: 4.3 g/dL (ref 3.5–5.2)
Alkaline Phosphatase: 54 U/L (ref 39–117)
BUN: 17 mg/dL (ref 6–23)
CO2: 30 mEq/L (ref 19–32)
Calcium: 9.4 mg/dL (ref 8.4–10.5)
Chloride: 98 mEq/L (ref 96–112)
Creatinine, Ser: 0.94 mg/dL (ref 0.40–1.50)
GFR: 78.65 mL/min (ref >60.00)
Glucose, Bld: 112 mg/dL — ABNORMAL HIGH (ref 70–99)
Potassium: 3.8 mEq/L (ref 3.5–5.1)
Sodium: 136 mEq/L (ref 135–145)
Total Bilirubin: 1.3 mg/dL — ABNORMAL HIGH (ref 0.2–1.2)
Total Protein: 7 g/dL (ref 6.0–8.3)

## 2022-05-10 NOTE — Addendum Note (Signed)
Addended by: Sherlene Shams on: 05/10/2022 02:13 PM   Modules accepted: Orders

## 2022-05-10 NOTE — Assessment & Plan Note (Signed)
Suspending crestor for two weeks due to elevated liver enzymes.

## 2022-05-10 NOTE — Addendum Note (Signed)
Addended by: Sherlene Shams on: 05/10/2022 02:16 PM   Modules accepted: Orders

## 2022-05-11 DIAGNOSIS — M5442 Lumbago with sciatica, left side: Secondary | ICD-10-CM | POA: Diagnosis not present

## 2022-05-11 NOTE — Telephone Encounter (Signed)
MyChart message sent to patient.

## 2022-05-14 MED ORDER — HYDRALAZINE HCL 25 MG PO TABS: 25.0000 mg | ORAL_TABLET | Freq: Three times a day (TID) | ORAL | 3 refills | Status: DC | PRN

## 2022-05-14 MED ORDER — SILDENAFIL CITRATE 50 MG PO TABS: 50.0000 mg | ORAL_TABLET | Freq: Every day | ORAL | 2 refills | Status: DC | PRN

## 2022-05-16 ENCOUNTER — Encounter: Payer: Self-pay | Admitting: Internal Medicine

## 2022-05-20 ENCOUNTER — Telehealth: Payer: Self-pay

## 2022-05-20 NOTE — Patient Outreach (Signed)
  Care Coordination   Initial Visit Note   05/20/2022 Name: Mark Farley. MRN: [id number] DOB: 22-Nov-1945  Mark Farley. is a 76 y.o. year old male who sees Darrick Huntsman, [facility], MD for primary care. I spoke with  Mark Farley. by phone today.  What matters to the patients health and wellness today?  Patient states he does not have any nursing or community resource needs at this time.      Goals Addressed             This Visit's Progress    Care coordination activities - no follow up needed       Care Coordination Interventions: Care coordination program/ services discussed  Social determinants of health survey completed Annual wellness visit discussed and patient advised to contact provider office to schedule Patient advised to contact primary care provider office  if care coordination services needed in the future            SDOH assessments and interventions completed:  Yes  SDOH Interventions Today    Flowsheet Row Most Recent Value  SDOH Interventions   Food Insecurity Interventions Intervention Not Indicated  Housing Interventions Intervention Not Indicated  Transportation Interventions Intervention Not Indicated        Care Coordination Interventions:  Yes, provided   Follow up plan: No further intervention required.   Encounter Outcome:  No Answer   Mark Ina RN,BSN,CCM [facility] Care Coordination [phone] direct line

## 2022-05-21 DIAGNOSIS — M48062 Spinal stenosis, lumbar region with neurogenic claudication: Secondary | ICD-10-CM | POA: Diagnosis not present

## 2022-05-28 ENCOUNTER — Other Ambulatory Visit: Payer: Self-pay

## 2022-05-28 DIAGNOSIS — N3289 Other specified disorders of bladder: Secondary | ICD-10-CM

## 2022-05-28 NOTE — Telephone Encounter (Signed)
US Pelvis limited ordered

## 2022-06-04 ENCOUNTER — Ambulatory Visit
Admission: RE | Admit: 2022-06-04 | Discharge: 2022-06-04 | Disposition: A | Discharge status: Home / Self Care | Payer: Medicare PPO | Source: Ambulatory Visit | Attending: Internal Medicine | Admitting: Internal Medicine

## 2022-06-04 DIAGNOSIS — N401 Enlarged prostate with lower urinary tract symptoms: Secondary | ICD-10-CM | POA: Insufficient documentation

## 2022-06-04 DIAGNOSIS — N138 Other obstructive and reflux uropathy: Secondary | ICD-10-CM | POA: Insufficient documentation

## 2022-06-04 DIAGNOSIS — N3289 Other specified disorders of bladder: Secondary | ICD-10-CM | POA: Insufficient documentation

## 2022-06-05 DIAGNOSIS — N138 Other obstructive and reflux uropathy: Secondary | ICD-10-CM | POA: Insufficient documentation

## 2022-06-05 DIAGNOSIS — N401 Enlarged prostate with lower urinary tract symptoms: Secondary | ICD-10-CM | POA: Insufficient documentation

## 2022-06-06 ENCOUNTER — Encounter: Payer: Self-pay | Admitting: Internal Medicine

## 2022-06-08 ENCOUNTER — Other Ambulatory Visit (INDEPENDENT_AMBULATORY_CARE_PROVIDER_SITE_OTHER): Payer: Medicare PPO

## 2022-06-08 DIAGNOSIS — R748 Abnormal levels of other serum enzymes: Secondary | ICD-10-CM | POA: Diagnosis not present

## 2022-06-08 DIAGNOSIS — N138 Other obstructive and reflux uropathy: Secondary | ICD-10-CM

## 2022-06-08 LAB — COMPREHENSIVE METABOLIC PANEL
ALT: 101 U/L — ABNORMAL HIGH (ref 0–53)
AST: 57 U/L — ABNORMAL HIGH (ref 0–37)
Albumin: 4.2 g/dL (ref 3.5–5.2)
Alkaline Phosphatase: 54 U/L (ref 39–117)
BUN: 14 mg/dL (ref 6–23)
CO2: 28 mEq/L (ref 19–32)
Calcium: 8.9 mg/dL (ref 8.4–10.5)
Chloride: 96 mEq/L (ref 96–112)
Creatinine, Ser: 0.93 mg/dL (ref 0.40–1.50)
GFR: 79.62 mL/min (ref >60.00)
Glucose, Bld: 130 mg/dL — ABNORMAL HIGH (ref 70–99)
Potassium: 4 mEq/L (ref 3.5–5.1)
Sodium: 132 mEq/L — ABNORMAL LOW (ref 135–145)
Total Bilirubin: 0.9 mg/dL (ref 0.2–1.2)
Total Protein: 6.8 g/dL (ref 6.0–8.3)

## 2022-06-09 ENCOUNTER — Other Ambulatory Visit: Payer: Self-pay | Admitting: Internal Medicine

## 2022-06-09 ENCOUNTER — Encounter: Payer: Self-pay | Admitting: Internal Medicine

## 2022-06-09 MED ORDER — TRAMADOL HCL 50 MG PO TABS: 100.0000 mg | ORAL_TABLET | Freq: Four times a day (QID) | ORAL | 0 refills | Status: DC | PRN

## 2022-06-09 NOTE — Addendum Note (Signed)
Addended by: Crecencio Mc on: 06/09/2022 12:01 PM   Modules accepted: Orders

## 2022-06-16 ENCOUNTER — Other Ambulatory Visit (INDEPENDENT_AMBULATORY_CARE_PROVIDER_SITE_OTHER): Payer: Medicare PPO

## 2022-06-16 DIAGNOSIS — R748 Abnormal levels of other serum enzymes: Secondary | ICD-10-CM

## 2022-06-16 LAB — HEPATIC FUNCTION PANEL
ALT: 74 U/L — ABNORMAL HIGH (ref 0–53)
AST: 43 U/L — ABNORMAL HIGH (ref 0–37)
Albumin: 4.2 g/dL (ref 3.5–5.2)
Alkaline Phosphatase: 53 U/L (ref 39–117)
Bilirubin, Direct: 0.2 mg/dL (ref 0.0–0.3)
Total Bilirubin: 1.2 mg/dL (ref 0.2–1.2)
Total Protein: 6.6 g/dL (ref 6.0–8.3)

## 2022-06-16 LAB — IBC + FERRITIN
Ferritin: 79.8 ng/mL (ref 22.0–322.0)
Iron: 139 ug/dL (ref 42–165)
Saturation Ratios: 36.1 % (ref 20.0–50.0)
TIBC: 385 ug/dL (ref 250.0–450.0)
Transferrin: 275 mg/dL (ref 212.0–360.0)

## 2022-06-17 DIAGNOSIS — M5442 Lumbago with sciatica, left side: Secondary | ICD-10-CM | POA: Diagnosis not present

## 2022-06-17 DIAGNOSIS — G8929 Other chronic pain: Secondary | ICD-10-CM | POA: Diagnosis not present

## 2022-06-17 DIAGNOSIS — M48062 Spinal stenosis, lumbar region with neurogenic claudication: Secondary | ICD-10-CM | POA: Diagnosis not present

## 2022-06-17 LAB — ANTI-SMITH ANTIBODY: ENA SM Ab Ser-aCnc: <1 AI

## 2022-06-18 ENCOUNTER — Telehealth: Payer: Self-pay | Admitting: Internal Medicine

## 2022-06-18 DIAGNOSIS — R748 Abnormal levels of other serum enzymes: Secondary | ICD-10-CM

## 2022-06-18 LAB — MITOCHONDRIAL ANTIBODIES: Mitochondrial M2 Ab, IgG: <=20 U (ref <=20.0)

## 2022-06-18 LAB — ANTI-MICROSOMAL ANTIBODY LIVER / KIDNEY: LKM1 Ab: <=20 U (ref <=20.0)

## 2022-06-18 LAB — ANA: Anti Nuclear Antibody (ANA): NEGATIVE

## 2022-06-18 LAB — HEPATITIS C ANTIBODY: Hepatitis C Ab: NONREACTIVE

## 2022-06-18 LAB — ANTI-SMOOTH MUSCLE ANTIBODY, IGG: Actin (Smooth Muscle) Antibody (IGG): <20 U (ref <20)

## 2022-06-18 LAB — ALPHA-1-ANTITRYPSIN: A-1 Antitrypsin, Ser: 111 mg/dL (ref 83–199)

## 2022-06-18 LAB — HEPATITIS B CORE ANTIBODY, TOTAL: Hep B Core Total Ab: NONREACTIVE

## 2022-06-18 LAB — HEPATITIS B SURFACE ANTIGEN: Hepatitis B Surface Ag: NONREACTIVE

## 2022-06-18 LAB — CERULOPLASMIN: Ceruloplasmin: 22 mg/dL (ref 18–36)

## 2022-06-18 LAB — HEPATITIS B SURFACE ANTIBODY,QUALITATIVE: Hep B S Ab: REACTIVE — AB

## 2022-06-18 NOTE — Telephone Encounter (Signed)
Patient has a lab appt 06/23/2022, there are No orders in. 

## 2022-06-19 ENCOUNTER — Encounter: Payer: Self-pay | Admitting: Internal Medicine

## 2022-06-19 LAB — ALKALINE PHOSPHATASE, ISOENZYMES
Alkaline Phosphatase: 62 IU/L (ref 44–121)
BONE FRACTION: 67 % (ref 12–68)
INTESTINAL FRAC.: 0 % (ref 0–18)
LIVER FRACTION: 33 % (ref 13–88)

## 2022-06-19 NOTE — Addendum Note (Signed)
Addended by: Crecencio Mc on: 06/19/2022 12:28 PM   Modules accepted: Orders

## 2022-06-22 ENCOUNTER — Encounter: Payer: Self-pay | Admitting: Internal Medicine

## 2022-06-22 DIAGNOSIS — R748 Abnormal levels of other serum enzymes: Secondary | ICD-10-CM

## 2022-06-23 ENCOUNTER — Other Ambulatory Visit: Payer: Medicare PPO

## 2022-06-24 ENCOUNTER — Telehealth: Payer: Self-pay | Admitting: Internal Medicine

## 2022-06-24 NOTE — Telephone Encounter (Signed)
Lft pt vm to call ofc to sch US. thanks ?

## 2022-06-30 ENCOUNTER — Other Ambulatory Visit: Payer: Medicare PPO

## 2022-06-30 ENCOUNTER — Telehealth: Payer: Self-pay

## 2022-06-30 NOTE — Telephone Encounter (Signed)
Lm w/ pt wife to cb to sched AWV

## 2022-07-01 ENCOUNTER — Ambulatory Visit
Admission: RE | Admit: 2022-07-01 | Discharge: 2022-07-01 | Disposition: A | Discharge status: Home / Self Care | Payer: Medicare PPO | Source: Ambulatory Visit | Attending: Internal Medicine | Admitting: Internal Medicine

## 2022-07-01 DIAGNOSIS — R748 Abnormal levels of other serum enzymes: Secondary | ICD-10-CM | POA: Insufficient documentation

## 2022-07-01 DIAGNOSIS — R945 Abnormal results of liver function studies: Secondary | ICD-10-CM | POA: Diagnosis not present

## 2022-07-01 DIAGNOSIS — K760 Fatty (change of) liver, not elsewhere classified: Secondary | ICD-10-CM | POA: Diagnosis not present

## 2022-07-02 DIAGNOSIS — M48062 Spinal stenosis, lumbar region with neurogenic claudication: Secondary | ICD-10-CM | POA: Diagnosis not present

## 2022-07-03 ENCOUNTER — Other Ambulatory Visit (INDEPENDENT_AMBULATORY_CARE_PROVIDER_SITE_OTHER): Payer: Medicare PPO

## 2022-07-03 ENCOUNTER — Ambulatory Visit: Payer: Medicare PPO | Admitting: Internal Medicine

## 2022-07-03 DIAGNOSIS — R748 Abnormal levels of other serum enzymes: Secondary | ICD-10-CM | POA: Diagnosis not present

## 2022-07-03 LAB — HEPATIC FUNCTION PANEL
ALT: 71 U/L — ABNORMAL HIGH (ref 0–53)
AST: 32 U/L (ref 0–37)
Albumin: 4.3 g/dL (ref 3.5–5.2)
Alkaline Phosphatase: 56 U/L (ref 39–117)
Bilirubin, Direct: 0.2 mg/dL (ref 0.0–0.3)
Total Bilirubin: 1.3 mg/dL — ABNORMAL HIGH (ref 0.2–1.2)
Total Protein: 6.8 g/dL (ref 6.0–8.3)

## 2022-07-14 ENCOUNTER — Ambulatory Visit: Payer: Medicare PPO | Admitting: Internal Medicine

## 2022-07-15 ENCOUNTER — Ambulatory Visit (INDEPENDENT_AMBULATORY_CARE_PROVIDER_SITE_OTHER): Payer: Medicare PPO

## 2022-07-15 ENCOUNTER — Encounter: Payer: Self-pay | Admitting: Internal Medicine

## 2022-07-15 ENCOUNTER — Ambulatory Visit: Payer: Medicare PPO | Attending: Internal Medicine | Admitting: Internal Medicine

## 2022-07-15 ENCOUNTER — Ambulatory Visit (INDEPENDENT_AMBULATORY_CARE_PROVIDER_SITE_OTHER): Payer: Medicare PPO | Admitting: Internal Medicine

## 2022-07-15 VITALS — BP 150/88 | HR 67 | Temp 97.4°F | Ht 66.5 in | Wt 198.0 lb

## 2022-07-15 VITALS — BP 142/90 | HR 93 | Ht 66.5 in | Wt 198.0 lb

## 2022-07-15 DIAGNOSIS — R7303 Prediabetes: Secondary | ICD-10-CM | POA: Diagnosis not present

## 2022-07-15 DIAGNOSIS — G8929 Other chronic pain: Secondary | ICD-10-CM | POA: Diagnosis not present

## 2022-07-15 DIAGNOSIS — K760 Fatty (change of) liver, not elsewhere classified: Secondary | ICD-10-CM

## 2022-07-15 DIAGNOSIS — I10 Essential (primary) hypertension: Secondary | ICD-10-CM

## 2022-07-15 DIAGNOSIS — I471 Supraventricular tachycardia, unspecified: Secondary | ICD-10-CM

## 2022-07-15 DIAGNOSIS — M5442 Lumbago with sciatica, left side: Secondary | ICD-10-CM | POA: Diagnosis not present

## 2022-07-15 DIAGNOSIS — R001 Bradycardia, unspecified: Secondary | ICD-10-CM

## 2022-07-15 DIAGNOSIS — E782 Mixed hyperlipidemia: Secondary | ICD-10-CM | POA: Diagnosis not present

## 2022-07-15 DIAGNOSIS — R748 Abnormal levels of other serum enzymes: Secondary | ICD-10-CM

## 2022-07-15 DIAGNOSIS — M48062 Spinal stenosis, lumbar region with neurogenic claudication: Secondary | ICD-10-CM | POA: Diagnosis not present

## 2022-07-15 DIAGNOSIS — I480 Paroxysmal atrial fibrillation: Secondary | ICD-10-CM | POA: Diagnosis not present

## 2022-07-15 NOTE — Progress Notes (Signed)
Follow-up Outpatient Visit Date: 07/15/2022  Primary Care Provider: Crecencio Mc, MD [address] [place] [address]  Primary Cardiologist: Esmond Plants, MD PhD  Chief Complaint: Slow heart rate  HPI:  Mark Farley is a 77 y.o. male with history of PSVT, syncope, hypertension, hyperlipidemia, prediabetes, COPD, and abnormal LFTs with recent abdominal ultrasound concerning for hepatic cirrhosis, who presents for urgent evaluation of bradycardia.  He was seen this morning by his pain specialist for follow-up of back pain after a recent lumbar spine injection.  Vital signs were notable for a heart rate in the 30s.  EKG was not performed.  Mark Farley was asymptomatic and continues to feel well.  His only complaint is of some continued intermittent low back pain.  He has not had any chest pain, shortness of breath, palpitations, lightheadedness, or edema.  He notes that his heart rates have been slow at other times in the past 1 vital signs are checked.  He has been compliant with his medications, including low-dose diltiazem that was added at his last visit with Dr. Rockey Situ in 11/2021.  --------------------------------------------------------------------------------------------------  Past Medical History:  Diagnosis Date   Arthritis    ? Gout   Bilateral carpal tunnel syndrome    managed with nocturnal braces x 3 years   COPD (chronic obstructive pulmonary disease) (HCC)    Dysrhythmia    Tachycardia, PVCs, SVTs   GERD (gastroesophageal reflux disease)    Glucose intolerance (impaired glucose tolerance)    Heart murmur    as a child   History of syncope 2005   Unexplained, myoview negative   Hyperlipidemia    Hypertension    Iron deficiency anemia    Osteoarthritis of multiple joints    Palpitations 2010   Life Watch with single PVC   Pre-diabetes    Past Surgical History:  Procedure Laterality Date   APPENDECTOMY     1957   COLONOSCOPY     COLONOSCOPY  WITH PROPOFOL N/A 01/24/2020   Procedure: COLONOSCOPY WITH PROPOFOL;  Surgeon: Lin Landsman, MD;  Location: [facility];  Service: Gastroenterology;  Laterality: N/A;   JOINT REPLACEMENT     TONSILLECTOMY     1952   TOTAL KNEE ARTHROPLASTY Right 03/14/2019   Procedure: RIGHT TOTAL KNEE ARTHROPLASTY;  Surgeon: Meredith Pel, MD;  Location: [place];  Service: Orthopedics;  Laterality: Right;   TOTAL KNEE ARTHROPLASTY Left 06/22/2019   Procedure: LEFT TOTAL KNEE ARTHROPLASTY;  Surgeon: Meredith Pel, MD;  Location: [place];  Service: Orthopedics;  Laterality: Left;    Current Meds  Medication Sig   amoxicillin (AMOXIL) 500 MG tablet Take 4 tablets by mouth (2,000 mg) 30-60 minutes prior to dental procedure.   aspirin EC 81 MG tablet Take 81 mg by mouth daily. Swallow whole.   Cyanocobalamin (VITAMIN B 12) 100 MCG LOZG Take by mouth daily.   diltiazem (CARDIZEM CD) 120 MG 24 hr capsule Take 1 capsule (120 mg total) by mouth daily.   diltiazem (CARDIZEM) 30 MG tablet Take 1 tablet (30 mg total) by mouth 4 (four) times daily as needed.   EPINEPHrine (EPIPEN 2-PAK) 0.3 mg/0.3 mL IJ SOAJ injection Inject 0.3 mg into the muscle as needed for anaphylaxis.   hydrALAZINE (APRESOLINE) 25 MG tablet Take 1 tablet (25 mg total) by mouth 3 (three) times daily as needed (for pressure >150).   hydrochlorothiazide (HYDRODIURIL) 25 MG tablet Take 1 tablet (25 mg total) by mouth daily.   metoprolol  succinate (TOPROL-XL) 50 MG 24 hr tablet Take 1 tablet (50 mg total) by mouth daily.   omeprazole (PRILOSEC) 20 MG capsule Take 1 capsule (20 mg total) by mouth daily.   sildenafil (VIAGRA) 50 MG tablet Take 1 tablet (50 mg total) by mouth daily as needed for erectile dysfunction.   telmisartan (MICARDIS) 80 MG tablet Take 1 tablet (80 mg total) by mouth daily.   timolol (TIMOPTIC) 0.5 % ophthalmic solution 1 drop 2 (two) times daily.   traMADol (ULTRAM) 50 MG tablet Take 2 tablets (100 mg total) by  mouth every 6 (six) hours as needed.   valACYclovir (VALTREX) 1000 MG tablet Take 1-2 g by mouth daily as needed (cold sores/fever blisters).     Allergies: Bee venom and Other  Social History   Tobacco Use   Smoking status: Never   Smokeless tobacco: Never  Vaping Use   Vaping Use: Never used  Substance Use Topics   Alcohol use: Not Currently    Comment: 1 glass of wine every 3-4 days   Drug use: No    Family History  Problem Relation Age of Onset   Coronary artery disease Mother    Heart attack Mother        MI   Heart failure Father        CHF   Colon cancer Brother    Colon cancer Paternal Grandmother     Review of Systems: A 12-system review of systems was performed and was negative except as noted in the HPI.  --------------------------------------------------------------------------------------------------  Physical Exam: BP (!) 142/90 (BP Location: Left Arm, Patient Position: Sitting, Cuff Size: Normal)   Pulse 93   Ht 5' 6.5" (1.689 m)   Wt 198 lb (89.8 kg)   SpO2 97%   BMI 31.48 kg/m   General:  NAD. Neck: No JVD or HJR. Lungs: Clear to auscultation bilaterally without wheezes or crackles. Heart: Regular rate and rhythm with occasional extrasystoles and brief runs of elevated heart rate. Abdomen: Soft, nontender, nondistended. Extremities: No lower extremity edema.  EKG: Sinus rhythm with frequent PSVT and nonspecific T wave changes.  No significant change from prior tracing on 12/05/2021.  Lab Results  Component Value Date   WBC 5.1 10/13/2021   HGB 14.1 10/13/2021   HCT 41.4 10/13/2021   MCV 91.3 10/13/2021   PLT 149.0 (L) 10/13/2021    Lab Results  Component Value Date   NA 132 (L) 06/08/2022   K 4.0 06/08/2022   CL 96 06/08/2022   CO2 28 06/08/2022   BUN 14 06/08/2022   CREATININE 0.93 06/08/2022   GLUCOSE 130 (H) 06/08/2022   ALT 71 (H) 07/03/2022    Lab Results  Component Value Date   CHOL 131 04/17/2022   HDL 45.50 04/17/2022    LDLCALC 54 10/13/2021   LDLDIRECT 72.0 04/17/2022   TRIG 205.0 (H) 04/17/2022   CHOLHDL 3 04/17/2022    --------------------------------------------------------------------------------------------------  ASSESSMENT AND PLAN: PSVT and bradycardia: Bradycardia was incidentally noted today during follow-up visit with pain specialist.  In our office today, EKG shows frequent bursts of PSVT with underlying sinus rhythm.  It is possible that manual assessment of heart rate could miss some beats in the setting of tachycardia leading to falsely low measurement of heart rate.  However, given history of first-degree AV block and idioventricular rhythm seen on monitor last year, progressive conduction disease in the setting of AV nodal blocker use is a possibility.  One could consider addition of an antiarrhythmic  such as amiodarone for suppression of PSVT, but in the setting of recently uncovered liver problems, I worry that the risks may outweigh benefits.  We have agreed to defer medication changes today and repeat a 14-day ambulatory cardiac monitor to better understand Mark Farley's arrhythmias.  He will follow-up with Dr. Rockey Situ shortly thereafter.  Consultation with EP may be helpful, particularly if monitor reveals episodic bradycardia and/or continued frequent PSVT.  Hypertension: Blood pressure mildly elevated today.  In the setting of aforementioned arrhythmias, we will defer medication changes today.  Mark Farley is also scheduled for follow-up with Dr. Derrel Nip this afternoon, when his blood pressure can be rechecked.  Follow-up: Return to clinic to see Dr. Rockey Situ in ~1 month.  Nelva Bush, MD 07/15/2022 12:59 PM

## 2022-07-15 NOTE — Progress Notes (Addendum)
Subjective:  Patient ID: Mark Farley., male    DOB: 23-Sep-1945  Age: 77 y.o. MRN: [id number]  CC: The primary encounter diagnosis was Fatty liver. Diagnoses of Prediabetes, Mixed hyperlipidemia, and Elevated liver enzymes were also pertinent to this visit.   HPI Mark Farley. 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  Bradycardic epsiode, asymptomatic  has been SEEING Mark Farley FOR BACK Pain.  Seen today for follow up on ESI.  Pulse was 38 and BP 140 .  Mark Farley contacted [facility], , had an EKG read by Mark Farley,  ZIO monitor ordered. He reports no  syncope or dizziness with changes in position  or with exercise.   Has not taken diltiazem CD in months   Fatty liver:  with nodular changes.  Discussed GI referral.  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  Drank more then when on leave,  but never had a hospitalization  for inebriation.  Has not drank more than 2 beers or 2 glasses of wine per night in a decade or more.  Does not drink nightly .   Resuming rosuvatstatin and recheck liver enzymes after 1 week   Received Td vaccine on Jan 3 at [facility] Medications Prior to Visit  Medication Sig Dispense Refill   amoxicillin (AMOXIL) 500 MG tablet Take 4 tablets by mouth (2,000 mg) 30-60 minutes prior to dental procedure. 10 tablet 0   aspirin EC 81 MG tablet Take 81 mg by mouth daily. Swallow whole.     Cyanocobalamin (VITAMIN B 12) 100 MCG LOZG Take by mouth daily.     diltiazem (CARDIZEM CD) 120 MG 24 hr capsule Take 1 capsule (120 mg total) by mouth daily. 90 capsule 3   EPINEPHrine (EPIPEN 2-PAK) 0.3 mg/0.3 mL IJ SOAJ injection Inject 0.3 mg into the muscle as needed for anaphylaxis.     hydrALAZINE (APRESOLINE) 25 MG tablet Take 1 tablet (25 mg total) by mouth  3 (three) times daily as needed (for pressure >150). 30 tablet 3   hydrochlorothiazide (HYDRODIURIL) 25 MG tablet Take 1 tablet (25 mg total) by mouth daily. 90 tablet 3   metoprolol succinate (TOPROL-XL) 50 MG 24 hr tablet Take 1 tablet (50 mg total) by mouth daily. 90 tablet 3   omeprazole (PRILOSEC) 20 MG capsule Take 1 capsule (20 mg total) by mouth daily. 90 capsule 3   sildenafil (VIAGRA) 50 MG tablet Take 1 tablet (50 mg total) by mouth daily as needed for erectile dysfunction. 10 tablet 2   telmisartan (MICARDIS) 80 MG tablet Take 1 tablet (80 mg total) by mouth daily. 90 tablet 3   timolol (TIMOPTIC) 0.5 % ophthalmic solution 1 drop 2 (two) times daily.     traMADol (ULTRAM) 50 MG tablet Take 2 tablets (100 mg total) by mouth every 6 (six) hours as needed. 240 tablet 0   valACYclovir (VALTREX) 1000 MG tablet Take 1-2 g by mouth daily as needed (cold sores/fever blisters).      diltiazem (CARDIZEM) 30 MG tablet Take 1 tablet (30 mg total) by mouth 4 (four) times daily as needed. (Patient not taking: Reported on 07/15/2022) 90 tablet 3   No facility-administered medications prior to visit.    Review of  Systems;  Patient denies headache, fevers, malaise, unintentional weight loss, skin rash, eye pain, sinus congestion and sinus pain, sore throat, dysphagia,  hemoptysis , cough, dyspnea, wheezing, chest pain, palpitations, orthopnea, edema, abdominal pain, nausea, melena, diarrhea, constipation, flank pain, dysuria, hematuria, urinary  Frequency, nocturia, numbness, tingling, seizures,  Focal weakness, Loss of consciousness,  Tremor, insomnia, depression, anxiety, and suicidal ideation.      Objective:  BP (!) 150/88   Pulse 67   Temp (!) 97.4 F (36.3 C) (Oral)   Ht 5' 6.5" (1.689 m)   Wt 198 lb (89.8 kg)   SpO2 96%   BMI 31.48 kg/m   BP Readings from Last 3 Encounters:  07/15/22 (!) 150/88  07/15/22 (!) 142/90  04/30/22 127/76    Wt Readings from Last 3 Encounters:   07/15/22 198 lb (89.8 kg)  07/15/22 198 lb (89.8 kg)  04/29/22 198 lb 12.8 oz (90.2 kg)    Physical Exam Vitals reviewed.  Constitutional:      General: He is not in acute distress.    Appearance: Normal appearance. He is normal weight. He is not ill-appearing, toxic-appearing or diaphoretic.  HENT:     Head: Normocephalic and atraumatic.     Right Ear: Tympanic membrane, ear canal and external ear normal. There is no impacted cerumen.     Left Ear: Tympanic membrane, ear canal and external ear normal. There is no impacted cerumen.     Nose: Nose normal.     Mouth/Throat:     Mouth: Mucous membranes are moist.     Pharynx: Oropharynx is clear.  Eyes:     General: No scleral icterus.       Right eye: No discharge.        Left eye: No discharge.     Conjunctiva/sclera: Conjunctivae normal.  Neck:     Thyroid: No thyromegaly.     Vascular: No carotid bruit or JVD.  Cardiovascular:     Rate and Rhythm: Normal rate and regular rhythm.     Heart sounds: Normal heart sounds.  Pulmonary:     Effort: Pulmonary effort is normal. No respiratory distress.     Breath sounds: Normal breath sounds.  Abdominal:     General: Bowel sounds are normal.     Palpations: Abdomen is soft. There is no mass.     Tenderness: There is no abdominal tenderness. There is no guarding or rebound.  Musculoskeletal:        General: Normal range of motion.     Cervical back: Normal range of motion and neck supple.  Lymphadenopathy:     Cervical: No cervical adenopathy.  Skin:    General: Skin is warm and dry.  Neurological:     General: No focal deficit present.     Mental Status: He is alert and oriented to person, place, and time. Mental status is at baseline.  Psychiatric:        Mood and Affect: Mood normal.        Behavior: Behavior normal.        Thought Content: Thought content normal.        Judgment: Judgment normal.     Lab Results  Component Value Date   HGBA1C 6.3 04/17/2022    HGBA1C 6.0 10/13/2021   HGBA1C 6.1 04/18/2021    Lab Results  Component Value Date   CREATININE 0.93 06/08/2022   CREATININE 0.94 05/07/2022   CREATININE 0.91 04/17/2022    Lab Results  Component Value  Date   WBC 5.1 10/13/2021   HGB 14.1 10/13/2021   HCT 41.4 10/13/2021   PLT 149.0 (L) 10/13/2021   GLUCOSE 130 (H) 06/08/2022   CHOL 131 04/17/2022   TRIG 205.0 (H) 04/17/2022   HDL 45.50 04/17/2022   LDLDIRECT 72.0 04/17/2022   LDLCALC 54 10/13/2021   ALT 71 (H) 07/03/2022   AST 32 07/03/2022   NA 132 (L) 06/08/2022   K 4.0 06/08/2022   CL 96 06/08/2022   CREATININE 0.93 06/08/2022   BUN 14 06/08/2022   CO2 28 06/08/2022   TSH 1.16 10/13/2021   PSA 0.60 10/13/2021   HGBA1C 6.3 04/17/2022   MICROALBUR 0.7 10/13/2021    US Abdomen Limited RUQ (LIVER/GB)  Result Date: 07/01/2022 CLINICAL DATA:  Elevated liver enzymes. EXAM: ULTRASOUND ABDOMEN LIMITED RIGHT UPPER QUADRANT COMPARISON:  April 14, 2018 FINDINGS: Gallbladder: No gallstones or wall thickening visualized. No sonographic Murphy sign noted by sonographer. Common bile duct: Diameter: 6.2 mm. Liver: No focal lesion identified. Diffuse increased echotexture of the liver with nodular contour. Portal vein is patent on color Doppler imaging with normal direction of blood flow towards the liver. Other: None. IMPRESSION: 1. No acute abnormality identified. 2. Fatty infiltration of the liver with nodular contour, suspicious for cirrhosis. Electronically Signed   By: Abelardo Diesel M.D.   On: 07/01/2022 09:36    Assessment & Plan:  .Fatty liver -     Comprehensive metabolic panel; Future -     Ambulatory referral to Gastroenterology  Prediabetes Assessment & Plan: Managed with low GI diet, weight loss and exercise.  a1c is stable .  He is  advised to resume taking a statin     Lab Results  Component Value Date   HGBA1C 6.3 04/17/2022      Mixed hyperlipidemia Assessment & Plan: Advised to resume crestor for two  weeks due to elevated liver enzymes.   Elevated liver enzymes Assessment & Plan: Autoimmune workup with serologies was negative.  Liver ultrasound suggestive of fatty liver with cirrhotic changes.  Referring to GI.  Resuming crestor,  repeating lFTs in 3 weeks     Follow-up: Return in about 1 week (around 07/22/2022).   Crecencio Mc, MD

## 2022-07-15 NOTE — Patient Instructions (Signed)
Medication Instructions:  Your Physician recommend you continue on your current medication as directed.    *If you need a refill on your cardiac medications before your next appointment, please call your pharmacy*   Lab Work: None ordered today   Testing/Procedures: Your physician has recommended that you wear a 14 day Zio AT Live monitor.   This monitor is a medical device that records the heart's electrical activity. Doctors most often use these monitors to diagnose arrhythmias. Arrhythmias are problems with the speed or rhythm of the heartbeat. The monitor is a small device applied to your chest. You can wear one while you do your normal daily activities. While wearing this monitor if you have any symptoms to push the button and record what you felt. Once you have worn this monitor for the period of time provider prescribed (Usually 14 days), you will return the monitor device in the postage paid box/bag. Once it is returned they will download the data collected and provide [place] with a report which the provider will then review and we will call you with those results.   Important tips:  Avoid showering during the first 24 hours of wearing the monitor. Avoid excessive sweating to help maximize wear time. Do not submerge the device, no hot tubs, and no swimming pools. Keep any lotions or oils away from the patch. After 24 hours you may shower with the patch on. Take brief showers with your back facing the shower head.  Do not remove patch once it has been placed because that will interrupt data and decrease adhesive wear time. Push the button when you have any symptoms and write down what you were feeling. Once you have completed wearing your monitor, remove and place into box which has postage paid and place in your outgoing mailbox.  If for some reason you have misplaced your box then call our office and we can provide another box and/or mail it off for you. Keep the transmitter within 10  feet at all times.  Expect a welcome phone call within [phone] hrs of application from [facility].  This call will include your copay information, so please answer any unknown phone calls while wearing Zio (it could also be important information about your heart) The envelope to return Zio is in the back of the transmitter. Removal instructions are on the last page of the symptom diary.  Place the patch sticky side up inside the transmitter and the symptom diary inside the envelope to return on your last wear day inside your mailbox or any USPS mailbox.    Follow-Up: At [facility], you and your health needs are our priority.  As part of our continuing mission to provide you with exceptional heart care, we have created designated Provider Care Teams.  These Care Teams include your primary Cardiologist (physician) and Advanced Practice Providers (APPs -  Physician Assistants and Nurse Practitioners) who all work together to provide you with the care you need, when you need it.  We recommend signing up for the patient portal called "MyChart".  Sign up information is provided on this After Visit Summary.  MyChart is used to connect with patients for Virtual Visits (Telemedicine).  Patients are able to view lab/test results, encounter notes, upcoming appointments, etc.  Non-urgent messages can be sent to your provider as well.   To learn more about what you can do with MyChart, go to [email or web address].    Your next appointment:   1 month(s)  Provider:  You may see Ida Rogue, MD or one of the following Advanced Practice Providers on your designated Care Team:   Murray Hodgkins, NP Christell Faith, PA-C Cadence Kathlen Mody, PA-C Gerrie Nordmann, NP

## 2022-07-15 NOTE — Patient Instructions (Signed)
Annual Wellness Visit due. Please scheduled at check out today.

## 2022-07-17 NOTE — Assessment & Plan Note (Addendum)
Autoimmune workup with serologies was negative.  Liver ultrasound suggestive of fatty liver with cirrhotic changes.  Referring to GI.  Resuming crestor,  repeating lFTs in 3 weeks

## 2022-07-17 NOTE — Assessment & Plan Note (Signed)
Advised to resume crestor for two weeks due to elevated liver enzymes.

## 2022-07-17 NOTE — Assessment & Plan Note (Signed)
Managed with low GI diet, weight loss and exercise.  a1c is stable .  He is  advised to resume taking a statin     Lab Results  Component Value Date   HGBA1C 6.3 04/17/2022

## 2022-07-18 DIAGNOSIS — R001 Bradycardia, unspecified: Secondary | ICD-10-CM | POA: Diagnosis not present

## 2022-07-18 DIAGNOSIS — I471 Supraventricular tachycardia, unspecified: Secondary | ICD-10-CM

## 2022-07-19 DIAGNOSIS — R001 Bradycardia, unspecified: Secondary | ICD-10-CM | POA: Diagnosis not present

## 2022-07-19 DIAGNOSIS — I471 Supraventricular tachycardia, unspecified: Secondary | ICD-10-CM | POA: Diagnosis not present

## 2022-07-21 ENCOUNTER — Ambulatory Visit (INDEPENDENT_AMBULATORY_CARE_PROVIDER_SITE_OTHER): Payer: Medicare PPO | Admitting: Urology

## 2022-07-21 VITALS — BP 155/86 | HR 58 | Ht 66.5 in | Wt 200.0 lb

## 2022-07-21 DIAGNOSIS — R350 Frequency of micturition: Secondary | ICD-10-CM | POA: Diagnosis not present

## 2022-07-21 DIAGNOSIS — R339 Retention of urine, unspecified: Secondary | ICD-10-CM

## 2022-07-21 DIAGNOSIS — N401 Enlarged prostate with lower urinary tract symptoms: Secondary | ICD-10-CM

## 2022-07-21 LAB — MICROSCOPIC EXAMINATION

## 2022-07-21 LAB — URINALYSIS, COMPLETE
Bilirubin, UA: NEGATIVE
Glucose, UA: NEGATIVE
Ketones, UA: NEGATIVE
Leukocytes,UA: NEGATIVE
Nitrite, UA: NEGATIVE
Protein,UA: NEGATIVE
RBC, UA: NEGATIVE
Specific Gravity, UA: 1.02 (ref 1.005–1.030)
Urobilinogen, Ur: 0.2 mg/dL (ref 0.2–1.0)
pH, UA: 6.5 (ref 5.0–7.5)

## 2022-07-21 LAB — BLADDER SCAN AMB NON-IMAGING: Scan Result: 108

## 2022-07-21 MED ORDER — TAMSULOSIN HCL 0.4 MG PO CAPS: 0.4000 mg | ORAL_CAPSULE | Freq: Every day | ORAL | 11 refills | Status: DC

## 2022-07-21 NOTE — Progress Notes (Signed)
I, Mark Farley,acting as a scribe for Mark Espy, MD.,have documented all relevant documentation on the behalf of Mark Espy, MD,as directed by  Mark Espy, MD while in the presence of Mark Espy, MD.   I, [name] as a scribe for Mark Espy, MD.,have documented all relevant documentation on the behalf of Mark Espy, MD,as directed by  Mark Espy, MD while in the presence of Mark Espy, MD.   07/21/22 10:28 AM   Mark Farley. [birth year] [id number]  Referring provider: Crecencio Mc, MD [facility],  Page [phone]  Chief Complaint  Patient presents with   Establish Care   Benign Prostatic Hypertrophy    HPI: 77 year-old male who is referred for urinary frequency.  His most recent PSA was 0.6 on 10/13/21.   His most recent urinalysis on 04/29/22 was unremarkable with a negative urine culture.  Pelvic ultrasound on 06/04/22 showed mild prostatomegaly. The calculated volume was only 28.5. Mild Bladder wall thickening with a PVR of 83.   He is taking 26m Viagra, presumably for ED.  Today, he mostly complain of urinary frequency which he reports has been a lifelong issue. He describes difficulty lasting through a one-hour class due to the need to urinate and states that this issue predominantly affects him during the daytime, although he occasionally wakes up at night to urinate. He reports that sometimes the urge to urinate subsides on its own. He has not previously sought medical treatment or taken any medication for this condition.   He drinks diet sodas, primarily decaffeinated Diet Coke and Diet Pepsi. Recently started drinking more water using a soda stream.   He denies dysuria or gross hematuria.  Results for orders placed or performed in visit on 07/21/22  Microscopic Examination   Urine  Result Value Ref Range   WBC, UA 0-5 0 - 5 /hpf   RBC, Urine 0-2 0 - 2 /hpf   Epithelial Cells (non  renal) 0-10 0 - 10 /hpf   Bacteria, UA Few None seen/Few  Urinalysis, Complete  Result Value Ref Range   Specific Gravity, UA 1.020 1.005 - 1.030   pH, UA 6.5 5.0 - 7.5   Color, UA Yellow Yellow   Appearance Ur Clear Clear   Leukocytes,UA Negative Negative   Protein,UA Negative Negative/Trace   Glucose, UA Negative Negative   Ketones, UA Negative Negative   RBC, UA Negative Negative   Bilirubin, UA Negative Negative   Urobilinogen, Ur 0.2 0.2 - 1.0 mg/dL   Nitrite, UA Negative Negative   Microscopic Examination See below:   Bladder Scan (Post Void Residual) in office  Result Value Ref Range   Scan Result 108 ml      IPSS     Row Name 07/21/22 0800         International Prostate Symptom Score   How often have you had the sensation of not emptying your bladder? Less than 1 in 5     How often have you had to urinate less than every two hours? More than half the time     How often have you found you stopped and started again several times when you urinated? About half the time     How often have you found it difficult to postpone urination? About half the time     How often have you had a weak urinary stream? Not at All     How often have you had to  strain to start urination? Not at All     How many times did you typically get up at night to urinate? 1 Time     Total IPSS Score 12       Quality of Life due to urinary symptoms   If you were to spend the rest of your life with your urinary condition just the way it is now how would you feel about that? Mostly Satisfied              Score:  1-7 Mild 8-19 Moderate 20-35 Severe   PMH: Past Medical History:  Diagnosis Date   Arthritis    ? Gout   Bilateral carpal tunnel syndrome    managed with nocturnal braces x 3 years   COPD (chronic obstructive pulmonary disease) (HCC)    Dysrhythmia    Tachycardia, PVCs, SVTs   GERD (gastroesophageal reflux disease)    Glucose intolerance (impaired glucose tolerance)    Heart  murmur    as a child   History of syncope 2005   Unexplained, myoview negative   Hyperlipidemia    Hypertension    Iron deficiency anemia    Osteoarthritis of multiple joints    Palpitations 2010   Life Watch with single PVC   Pre-diabetes     Surgical History: Past Surgical History:  Procedure Laterality Date   APPENDECTOMY     1957   COLONOSCOPY     COLONOSCOPY WITH PROPOFOL N/A 01/24/2020   Procedure: COLONOSCOPY WITH PROPOFOL;  Surgeon: Lin Landsman, MD;  Location: [facility];  Service: Gastroenterology;  Laterality: N/A;   JOINT REPLACEMENT     TONSILLECTOMY     1952   TOTAL KNEE ARTHROPLASTY Right 03/14/2019   Procedure: RIGHT TOTAL KNEE ARTHROPLASTY;  Surgeon: Meredith Pel, MD;  Location: [facility];  Service: Orthopedics;  Laterality: Right;   TOTAL KNEE ARTHROPLASTY Left 06/22/2019   Procedure: LEFT TOTAL KNEE ARTHROPLASTY;  Surgeon: Meredith Pel, MD;  Location: [facility];  Service: Orthopedics;  Laterality: Left;    Home Medications:  Allergies as of 07/21/2022       Reactions   Bee Venom Swelling, Rash   Other Rash        Medication List        Accurate as of July 21, 2022 10:28 AM. If you have any questions, ask your nurse or doctor.          amoxicillin 500 MG tablet Commonly known as: AMOXIL Take 4 tablets by mouth (2,000 mg) 30-60 minutes prior to dental procedure.   aspirin EC 81 MG tablet Take 81 mg by mouth daily. Swallow whole.   diltiazem 120 MG 24 hr capsule Commonly known as: CARDIZEM CD Take 1 capsule (120 mg total) by mouth daily.   diltiazem 30 MG tablet Commonly known as: CARDIZEM Take 1 tablet (30 mg total) by mouth 4 (four) times daily as needed.   EpiPen 2-Pak 0.3 mg/0.3 mL Soaj injection Generic drug: EPINEPHrine Inject 0.3 mg into the muscle as needed for anaphylaxis.   hydrALAZINE 25 MG tablet Commonly known as: APRESOLINE Take 1 tablet (25 mg total) by mouth 3 (three) times daily as needed (for  pressure >150).   hydrochlorothiazide 25 MG tablet Commonly known as: HYDRODIURIL Take 1 tablet (25 mg total) by mouth daily.   metoprolol succinate 50 MG 24 hr tablet Commonly known as: TOPROL-XL Take 1 tablet (50 mg total) by mouth daily.   omeprazole 20 MG capsule Commonly known  as: PRILOSEC Take 1 capsule (20 mg total) by mouth daily.   sildenafil 50 MG tablet Commonly known as: VIAGRA Take 1 tablet (50 mg total) by mouth daily as needed for erectile dysfunction.   tamsulosin 0.4 MG Caps capsule Commonly known as: FLOMAX Take 1 capsule (0.4 mg total) by mouth daily.   telmisartan 80 MG tablet Commonly known as: MICARDIS Take 1 tablet (80 mg total) by mouth daily.   timolol 0.5 % ophthalmic solution Commonly known as: TIMOPTIC 1 drop 2 (two) times daily.   traMADol 50 MG tablet Commonly known as: ULTRAM Take 2 tablets (100 mg total) by mouth every 6 (six) hours as needed.   valACYclovir 1000 MG tablet Commonly known as: VALTREX Take 1-2 g by mouth daily as needed (cold sores/fever blisters).   Vitamin B 12 100 MCG Lozg Take by mouth daily.        Allergies:  Allergies  Allergen Reactions   Bee Venom Swelling and Rash   Other Rash    Family History: Family History  Problem Relation Age of Onset   Coronary artery disease Mother    Heart attack Mother        MI   Heart failure Father        CHF   Colon cancer Brother    Colon cancer Paternal Grandmother     Social History:  reports that he has never smoked. He has never used smokeless tobacco. He reports that he does not currently use alcohol. He reports that he does not use drugs.   Physical Exam: BP (!) 155/86   Pulse (!) 58   Ht 5' 6.5" (1.689 m)   Wt 200 lb (90.7 kg)   BMI 31.80 kg/m   Constitutional:  Alert and oriented, No acute distress. HEENT: [place] AT, moist mucus membranes.  Trachea midline, no masses. Neurologic: Grossly intact, no focal deficits, moving all 4  extremities. Psychiatric: Normal mood and affect.  Pertinent Imaging: EXAM: ULTRASOUND OF THE MALE PELVIS   COMPARISON:  None Available. FINDINGS: Prostate gland: Mildly enlarged, 4.0 x 3.6 x 3.8 cm, volume 28.5 mL. Central prostate calcifications.   Seminal vesicles:  Unremarkable   Bladder: Moderately distended. Mild wall thickening. Bladder volume 180 mL prevoid and 83 mL postvoid. No bladder mass or stone.   IMPRESSION: 1. Mild prostate enlargement. 2. Mild thickening of the bladder wall may be due to chronic bladder outlet obstruction. No bladder mass. 3. Significant postvoid residual, pre void volume 180 mL, postvoid volume 83 mL.     Electronically Signed   By: Lajean Manes M.D.   On: 06/05/2022 11:33  This was personally reviewed and agreed with radiologic interpretation.  Assessment & Plan:    BPH with urinary frequency and incomplete emptying - PVR is mildly elevated. Despite the mild prostatomegaly, his PSA is low, and his lifelong urinary frequency suggests a bladder storage problem rather than a prostate issue.  - Recommended reducing intake of bladder irritants like diet sodas and switching to more neutral fluids like water. He is encouraged to observe if there's a correlation between his fluid intake and urinary symptoms. - We discussed consideration of Overactive Bladder medication versus an alpha blocker.  -A trial of Flomax is initiated to address potential prostate-related emptying problems and improve urinary frequency. He is advised to monitor for side effects such as retrograde ejaculation and orthostatic hypotension. He is instructed to take the medication at night to minimize the risk of dizziness. - Deferred rectal exam  based on his age and very low PSA  Return in 1 month (on 08/19/2022) for IPSS and PVR.  I have reviewed the above documentation for accuracy and completeness, and I agree with the above.   Mark Espy, MD   [facility] [address], [place], [place] [address] [phone]

## 2022-07-22 ENCOUNTER — Other Ambulatory Visit (INDEPENDENT_AMBULATORY_CARE_PROVIDER_SITE_OTHER): Payer: Medicare PPO

## 2022-07-22 DIAGNOSIS — K760 Fatty (change of) liver, not elsewhere classified: Secondary | ICD-10-CM

## 2022-07-22 LAB — COMPREHENSIVE METABOLIC PANEL
ALT: 59 U/L — ABNORMAL HIGH (ref 0–53)
AST: 36 U/L (ref 0–37)
Albumin: 4.1 g/dL (ref 3.5–5.2)
Alkaline Phosphatase: 54 U/L (ref 39–117)
BUN: 17 mg/dL (ref 6–23)
CO2: 26 mEq/L (ref 19–32)
Calcium: 9.1 mg/dL (ref 8.4–10.5)
Chloride: 97 mEq/L (ref 96–112)
Creatinine, Ser: 0.92 mg/dL (ref 0.40–1.50)
GFR: 80.59 mL/min (ref >60.00)
Glucose, Bld: 159 mg/dL — ABNORMAL HIGH (ref 70–99)
Potassium: 3.8 mEq/L (ref 3.5–5.1)
Sodium: 136 mEq/L (ref 135–145)
Total Bilirubin: 1.2 mg/dL (ref 0.2–1.2)
Total Protein: 6.3 g/dL (ref 6.0–8.3)

## 2022-07-23 ENCOUNTER — Encounter: Payer: Self-pay | Admitting: Internal Medicine

## 2022-07-23 NOTE — Addendum Note (Signed)
Addended by: Crecencio Mc on: 07/23/2022 01:00 PM   Modules accepted: Orders

## 2022-07-23 NOTE — Assessment & Plan Note (Signed)
Repeat liver enzymes have continued to normalize after Crestor restart .  Repeat in 6 weeks

## 2022-07-30 ENCOUNTER — Ambulatory Visit (INDEPENDENT_AMBULATORY_CARE_PROVIDER_SITE_OTHER): Payer: Medicare PPO | Admitting: Internal Medicine

## 2022-07-30 ENCOUNTER — Encounter: Payer: Self-pay | Admitting: Internal Medicine

## 2022-07-30 VITALS — BP 122/78 | HR 71 | Temp 98.3°F | Ht 66.5 in | Wt 197.6 lb

## 2022-07-30 DIAGNOSIS — R7303 Prediabetes: Secondary | ICD-10-CM | POA: Diagnosis not present

## 2022-07-30 DIAGNOSIS — M545 Low back pain, unspecified: Secondary | ICD-10-CM

## 2022-07-30 DIAGNOSIS — R748 Abnormal levels of other serum enzymes: Secondary | ICD-10-CM | POA: Diagnosis not present

## 2022-07-30 DIAGNOSIS — K760 Fatty (change of) liver, not elsewhere classified: Secondary | ICD-10-CM | POA: Diagnosis not present

## 2022-07-30 MED ORDER — LIDOCAINE 5 % EX PTCH: 1.0000 | MEDICATED_PATCH | CUTANEOUS | 3 refills | Status: DC

## 2022-07-30 NOTE — Assessment & Plan Note (Signed)
Fatty liver with nodular changes suggestive of cirrhosis .  Advised to abstain from alcohol and tylenol as much as possible . GI referral in progress

## 2022-07-30 NOTE — Progress Notes (Signed)
Subjective:  Patient ID: Mark Farley., male    DOB: 1945/10/10  Age: 77 y.o. MRN: [id number]  CC: The primary encounter diagnosis was Prediabetes. Diagnoses of Hepatic steatosis, Elevated liver enzymes, and Low back pain associated with a spinal disorder other than radiculopathy or spinal stenosis were also pertinent to this visit.   HPI Mark Farley. presents for  Chief Complaint  Patient presents with   Medical Management of Chronic Issues    3 month follow up    1) elevated liver enzymes and abnormal liver ultrasound suggestive of cirrhosis . LFTS trending down  but not normal .  Awaiting GI referral.  No recent alcohol use   2) Chronic back pain:   Reviewed her recent MRI   Has  stopped tramadol and has been using salon pas with lidocaine.with moderate relief.     Outpatient Medications Prior to Visit  Medication Sig Dispense Refill   amoxicillin (AMOXIL) 500 MG tablet Take 4 tablets by mouth (2,000 mg) 30-60 minutes prior to dental procedure. 10 tablet 0   aspirin EC 81 MG tablet Take 81 mg by mouth daily. Swallow whole.     Cyanocobalamin (VITAMIN B 12) 100 MCG LOZG Take by mouth daily.     diltiazem (CARDIZEM CD) 120 MG 24 hr capsule Take 1 capsule (120 mg total) by mouth daily. 90 capsule 3   EPINEPHrine (EPIPEN 2-PAK) 0.3 mg/0.3 mL IJ SOAJ injection Inject 0.3 mg into the muscle as needed for anaphylaxis.     hydrALAZINE (APRESOLINE) 25 MG tablet Take 1 tablet (25 mg total) by mouth 3 (three) times daily as needed (for pressure >150). 30 tablet 3   hydrochlorothiazide (HYDRODIURIL) 25 MG tablet Take 1 tablet (25 mg total) by mouth daily. 90 tablet 3   metoprolol succinate (TOPROL-XL) 50 MG 24 hr tablet Take 1 tablet (50 mg total) by mouth daily. 90 tablet 3   omeprazole (PRILOSEC) 20 MG capsule Take 1 capsule (20 mg total) by mouth daily. 90 capsule 3   rosuvastatin (CRESTOR) 10 MG tablet Take 10 mg by mouth daily.     sildenafil (VIAGRA) 50 MG tablet Take  1 tablet (50 mg total) by mouth daily as needed for erectile dysfunction. 10 tablet 2   tamsulosin (FLOMAX) 0.4 MG CAPS capsule Take 1 capsule (0.4 mg total) by mouth daily. 30 capsule 11   telmisartan (MICARDIS) 80 MG tablet Take 1 tablet (80 mg total) by mouth daily. 90 tablet 3   timolol (TIMOPTIC) 0.5 % ophthalmic solution 1 drop 2 (two) times daily.     traMADol (ULTRAM) 50 MG tablet Take 2 tablets (100 mg total) by mouth every 6 (six) hours as needed. 240 tablet 0   valACYclovir (VALTREX) 1000 MG tablet Take 1-2 g by mouth daily as needed (cold sores/fever blisters).      diltiazem (CARDIZEM) 30 MG tablet Take 1 tablet (30 mg total) by mouth 4 (four) times daily as needed. (Patient not taking: Reported on 07/30/2022) 90 tablet 3   No facility-administered medications prior to visit.    Review of Systems;  Patient denies headache, fevers, malaise, unintentional weight loss, skin rash, eye pain, sinus congestion and sinus pain, sore throat, dysphagia,  hemoptysis , cough, dyspnea, wheezing, chest pain, palpitations, orthopnea, edema, abdominal pain, nausea, melena, diarrhea, constipation, flank pain, dysuria, hematuria, urinary  Frequency, nocturia, numbness, tingling, seizures,  Focal weakness, Loss of consciousness,  Tremor, insomnia, depression, anxiety, and suicidal ideation.  Objective:  BP 122/78   Pulse 71   Temp 98.3 F (36.8 C) (Oral)   Ht 5' 6.5" (1.689 m)   Wt 197 lb 9.6 oz (89.6 kg)   SpO2 96%   BMI 31.42 kg/m   BP Readings from Last 3 Encounters:  07/30/22 122/78  07/21/22 (!) 155/86  07/15/22 (!) 150/88    Wt Readings from Last 3 Encounters:  07/30/22 197 lb 9.6 oz (89.6 kg)  07/21/22 200 lb (90.7 kg)  07/15/22 198 lb (89.8 kg)    Physical Exam Vitals reviewed.  Constitutional:      General: He is not in acute distress.    Appearance: Normal appearance. He is normal weight. He is not ill-appearing, toxic-appearing or diaphoretic.  HENT:     Head:  Normocephalic.  Eyes:     General: No scleral icterus.       Right eye: No discharge.        Left eye: No discharge.     Conjunctiva/sclera: Conjunctivae normal.  Cardiovascular:     Rate and Rhythm: Normal rate and regular rhythm.     Heart sounds: Normal heart sounds.  Pulmonary:     Effort: Pulmonary effort is normal. No respiratory distress.     Breath sounds: Normal breath sounds.  Musculoskeletal:        General: Normal range of motion.     Cervical back: Normal range of motion.  Skin:    General: Skin is warm and dry.  Neurological:     General: No focal deficit present.     Mental Status: He is alert and oriented to person, place, and time. Mental status is at baseline.  Psychiatric:        Mood and Affect: Mood normal.        Behavior: Behavior normal.        Thought Content: Thought content normal.        Judgment: Judgment normal.     Lab Results  Component Value Date   HGBA1C 6.3 04/17/2022   HGBA1C 6.0 10/13/2021   HGBA1C 6.1 04/18/2021    Lab Results  Component Value Date   CREATININE 0.92 07/22/2022   CREATININE 0.93 06/08/2022   CREATININE 0.94 05/07/2022    Lab Results  Component Value Date   WBC 5.1 10/13/2021   HGB 14.1 10/13/2021   HCT 41.4 10/13/2021   PLT 149.0 (L) 10/13/2021   GLUCOSE 159 (H) 07/22/2022   CHOL 131 04/17/2022   TRIG 205.0 (H) 04/17/2022   HDL 45.50 04/17/2022   LDLDIRECT 72.0 04/17/2022   LDLCALC 54 10/13/2021   ALT 59 (H) 07/22/2022   AST 36 07/22/2022   NA 136 07/22/2022   K 3.8 07/22/2022   CL 97 07/22/2022   CREATININE 0.92 07/22/2022   BUN 17 07/22/2022   CO2 26 07/22/2022   TSH 1.16 10/13/2021   PSA 0.60 10/13/2021   HGBA1C 6.3 04/17/2022   MICROALBUR 0.7 10/13/2021    US Abdomen Limited RUQ (LIVER/GB)  Result Date: 07/01/2022 CLINICAL DATA:  Elevated liver enzymes. EXAM: ULTRASOUND ABDOMEN LIMITED RIGHT UPPER QUADRANT COMPARISON:  April 14, 2018 FINDINGS: Gallbladder: No gallstones or wall  thickening visualized. No sonographic Murphy sign noted by sonographer. Common bile duct: Diameter: 6.2 mm. Liver: No focal lesion identified. Diffuse increased echotexture of the liver with nodular contour. Portal vein is patent on color Doppler imaging with normal direction of blood flow towards the liver. Other: None. IMPRESSION: 1. No acute abnormality identified. 2. Fatty infiltration of  the liver with nodular contour, suspicious for cirrhosis. Electronically Signed   By: Abelardo Diesel M.D.   On: 07/01/2022 09:36    Assessment & Plan:  .Prediabetes -     Hemoglobin A1c; Future  Hepatic steatosis Assessment & Plan: Fatty liver with nodular changes suggestive of cirrhosis .  Advised to abstain from alcohol and tylenol as much as possible . GI referral in progress   Elevated liver enzymes Assessment & Plan: Continue monthly surveillance until normalization occurs.    Low back pain associated with a spinal disorder other than radiculopathy or spinal stenosis Assessment & Plan: Continue use of salon pas with lidocaine for pain management    Other orders -     Lidocaine; Place 1 patch onto the skin daily. Remove & Discard patch within 12 hours or as directed by MD  Dispense: 90 patch; Refill: 3     I provided 30 minutes of face-to-face time during this encounter reviewing patient's last visit with me, patient's  most recent visit with cardiology,  nephrology,  and neurology,  recent surgical and non surgical procedures, previous  labs and imaging studies, counseling on currently addressed issues,  and post visit ordering to diagnostics and therapeutics .   Follow-up: Return in about 3 months (around 10/28/2022) for physical.   Crecencio Mc, MD

## 2022-07-30 NOTE — Patient Instructions (Signed)
You are due for your Medicare Annual Wellness visit, please schedule this appointment at checkout.

## 2022-07-30 NOTE — Assessment & Plan Note (Signed)
Continue monthly surveillance until normalization occurs.

## 2022-07-30 NOTE — Assessment & Plan Note (Signed)
Continue use of salon pas with lidocaine for pain management

## 2022-08-18 ENCOUNTER — Telehealth: Payer: Self-pay | Admitting: Internal Medicine

## 2022-08-18 NOTE — Telephone Encounter (Signed)
Follow Up:    Patient said he was returning Alisha's call from yesterday.

## 2022-08-18 NOTE — Telephone Encounter (Signed)
Left message for pt to call back  °

## 2022-08-18 NOTE — Telephone Encounter (Signed)
Meryl Crutch, RN 08/17/2022  4:28 PM EDT     Left message to call back.   Nelva Bush, MD 08/17/2022  3:25 PM EDT     Please let Mr. Ruszala know that his event monitor showed numerous extra beats and episodes of supraventricular tachycardia.  I think it would be helpful for Mr. Govin to consult with electrophysiology for further evaluation and management of his frequent supraventricular ectopy.  I will defer placing a consult request today, as Mr. Amore is scheduled to see Dr. Rockey Situ next week; formal EP evaluation should be discussed at that time.  I will forward these results to Dr. Rockey Situ for his review as well.

## 2022-08-19 ENCOUNTER — Ambulatory Visit (INDEPENDENT_AMBULATORY_CARE_PROVIDER_SITE_OTHER): Payer: Medicare PPO | Admitting: Urology

## 2022-08-19 VITALS — BP 127/80 | HR 71 | Ht 66.5 in | Wt 191.0 lb

## 2022-08-19 DIAGNOSIS — N401 Enlarged prostate with lower urinary tract symptoms: Secondary | ICD-10-CM | POA: Diagnosis not present

## 2022-08-19 DIAGNOSIS — R350 Frequency of micturition: Secondary | ICD-10-CM

## 2022-08-19 LAB — BLADDER SCAN AMB NON-IMAGING: Scan Result: 163

## 2022-08-19 NOTE — Progress Notes (Signed)
[name],acting as a scribe for Mark Espy, MD.,have documented all relevant documentation on the behalf of Mark Espy, MD,as directed by  Mark Espy, MD while in the presence of Mark Espy, MD.  08/19/2022 12:43 PM   Mark Farley. Oct 20, 1945 [id number]  Referring provider: Crecencio Mc, MD [place],  [place] [address]  No chief complaint on file.   HPI: 77 year-old male who was seen and evaluated last month for urinary frequency.   This is is a lifelong issue. At his last visit, we discussed behavioral modification as well as a trial of Flomax.   Today, his urinary frequency has improved. He denies nocturia and is overall very pleased. He states that he has been outdoors working and has not been drinking as much. He reports a lack of sensation during intercourse that he attributes as a side effect of the Flomax. He does not feel as though the Flomax had any effect on his urinary symptoms.   Results for orders placed or performed in visit on 08/19/22  Bladder Scan (Post Void Residual) in office  Result Value Ref Range   Scan Result 163 ml     IPSS     Row Name 08/19/22 0800         International Prostate Symptom Score   How often have you had the sensation of not emptying your bladder? Less than 1 in 5     How often have you had to urinate less than every two hours? Less than 1 in 5 times     How often have you found you stopped and started again several times when you urinated? Not at All     How often have you found it difficult to postpone urination? Less than 1 in 5 times     How often have you had a weak urinary stream? Not at All     How often have you had to strain to start urination? Not at All     How many times did you typically get up at night to urinate? None     Total IPSS Score 3       Quality of Life due to urinary symptoms   If you were to spend the rest of your life with your urinary condition  just the way it is now how would you feel about that? Pleased              Score:  1-7 Mild 8-19 Moderate 20-35 Severe    PMH: Past Medical History:  Diagnosis Date   Arthritis    ? Gout   Bilateral carpal tunnel syndrome    managed with nocturnal braces x 3 years   COPD (chronic obstructive pulmonary disease) (HCC)    Dysrhythmia    Tachycardia, PVCs, SVTs   GERD (gastroesophageal reflux disease)    Glucose intolerance (impaired glucose tolerance)    Heart murmur    as a child   History of syncope 2005   Unexplained, myoview negative   Hyperlipidemia    Hypertension    Iron deficiency anemia    Osteoarthritis of multiple joints    Palpitations 2010   Life Watch with single PVC   Pre-diabetes     Surgical History: Past Surgical History:  Procedure Laterality Date   APPENDECTOMY     1957   COLONOSCOPY     COLONOSCOPY WITH PROPOFOL N/A 01/24/2020   Procedure: COLONOSCOPY WITH PROPOFOL;  Surgeon: Marius Ditch,  Tally Due, MD;  Location: [place];  Service: Gastroenterology;  Laterality: N/A;   JOINT REPLACEMENT     TONSILLECTOMY     1952   TOTAL KNEE ARTHROPLASTY Right 03/14/2019   Procedure: RIGHT TOTAL KNEE ARTHROPLASTY;  Surgeon: Meredith Pel, MD;  Location: [place];  Service: Orthopedics;  Laterality: Right;   TOTAL KNEE ARTHROPLASTY Left 06/22/2019   Procedure: LEFT TOTAL KNEE ARTHROPLASTY;  Surgeon: Meredith Pel, MD;  Location: [place];  Service: Orthopedics;  Laterality: Left;    Home Medications:  Allergies as of 08/19/2022       Reactions   Bee Venom Swelling, Rash   Other Rash        Medication List        Accurate as of August 19, 2022 12:43 PM. If you have any questions, ask your nurse or doctor.          STOP taking these medications    tamsulosin 0.4 MG Caps capsule Commonly known as: FLOMAX       TAKE these medications    amoxicillin 500 MG tablet Commonly known as: AMOXIL Take 4 tablets by mouth (2,000 mg)  30-60 minutes prior to dental procedure.   aspirin EC 81 MG tablet Take 81 mg by mouth daily. Swallow whole.   diltiazem 120 MG 24 hr capsule Commonly known as: CARDIZEM CD Take 1 capsule (120 mg total) by mouth daily.   EpiPen 2-Pak 0.3 mg/0.3 mL Soaj injection Generic drug: EPINEPHrine Inject 0.3 mg into the muscle as needed for anaphylaxis.   hydrALAZINE 25 MG tablet Commonly known as: APRESOLINE Take 1 tablet (25 mg total) by mouth 3 (three) times daily as needed (for pressure >150).   hydrochlorothiazide 25 MG tablet Commonly known as: HYDRODIURIL Take 1 tablet (25 mg total) by mouth daily.   lidocaine 5 % Commonly known as: LIDODERM Place 1 patch onto the skin daily. Remove & Discard patch within 12 hours or as directed by MD   metoprolol succinate 50 MG 24 hr tablet Commonly known as: TOPROL-XL Take 1 tablet (50 mg total) by mouth daily.   omeprazole 20 MG capsule Commonly known as: PRILOSEC Take 1 capsule (20 mg total) by mouth daily.   rosuvastatin 10 MG tablet Commonly known as: CRESTOR Take 10 mg by mouth daily.   sildenafil 50 MG tablet Commonly known as: VIAGRA Take 1 tablet (50 mg total) by mouth daily as needed for erectile dysfunction.   telmisartan 80 MG tablet Commonly known as: MICARDIS Take 1 tablet (80 mg total) by mouth daily.   timolol 0.5 % ophthalmic solution Commonly known as: TIMOPTIC 1 drop 2 (two) times daily.   traMADol 50 MG tablet Commonly known as: ULTRAM Take 2 tablets (100 mg total) by mouth every 6 (six) hours as needed.   valACYclovir 1000 MG tablet Commonly known as: VALTREX Take 1-2 g by mouth daily as needed (cold sores/fever blisters).   Vitamin B 12 100 MCG Lozg Take by mouth daily.        Allergies:  Allergies  Allergen Reactions   Bee Venom Swelling and Rash   Other Rash    Family History: Family History  Problem Relation Age of Onset   Coronary artery disease Mother    Heart attack Mother         MI   Heart failure Father        CHF   Colon cancer Brother    Colon cancer Paternal Grandmother  Social History:  reports that he has never smoked. He has never used smokeless tobacco. He reports that he does not currently use alcohol. He reports that he does not use drugs.   Physical Exam: BP 127/80   Pulse 71   Ht 5' 6.5" (1.689 m)   Wt 191 lb (86.6 kg)   BMI 30.37 kg/m   Constitutional:  Alert and oriented, No acute distress. HEENT: [place] AT, moist mucus membranes.  Trachea midline, no masses. Neurologic: Grossly intact, no focal deficits, moving all 4 extremities. Psychiatric: Normal mood and affect.  Assessment & Plan:    1. BPH with urinary frequency - Discontinue Flomax as it has been ineffective with poorly tolerated side effects. - Continue behavioral modification. Recommend pay close attention to not become dehydrated with decreased fluid intake. -PVR is borderline but still reasonable -Overall, he is relatively asymptomatic and as such, would not consider any further medications or intervention at this time, he is in agreement with this plan - May consider further intervention if UTI's, incomplete bladder emptying, or other urinary symptoms develop.   Return if symptoms worsen or fail to improve.  I have reviewed the above documentation for accuracy and completeness, and I agree with the above.   Mark Espy, MD  [facility] [address], [place], [place] [address] [phone]

## 2022-08-20 NOTE — Telephone Encounter (Signed)
Pt made aware of monitor results on 3/20. Please see previous phone encounter

## 2022-08-23 ENCOUNTER — Encounter: Payer: Self-pay | Admitting: Internal Medicine

## 2022-08-24 ENCOUNTER — Other Ambulatory Visit: Payer: Self-pay | Admitting: Internal Medicine

## 2022-08-24 DIAGNOSIS — K760 Fatty (change of) liver, not elsewhere classified: Secondary | ICD-10-CM

## 2022-08-24 NOTE — Progress Notes (Unsigned)
Evaluation Performed:  Follow-up visit  Date:  08/25/2022   ID:  Mark Specter., DOB 21-Dec-1945, MRN [id number]  Patient Location:  [place]  [id number]   Provider location:   [facility], [place] office  PCP:  Crecencio Mc, MD  Cardiologist:  Patsy Baltimore   Chief Complaint  Patient presents with   Follow-up    1 month f/u.  Discuss Zio monitor results.  Medications reviewed verbally    History of Present Illness:    Mark Behrns. is a 77 y.o. male past medical history of Farley,  hyperlipidemia previous syncope, negative Myoview in 2005. h/o palpitations and had a Life Watch monitor for several weeks but no events arthritis,  anemia from iron deficiency Previous Holter monitor showing runs of SVT, rate up to 150 bpm Elevated LFTS on crestor 10 CT coronary calcium scoring July 2020,Score 638, 73rd percentile Aorta with mild atherosclerosis on CT He presents today for follow-up of his palpitations, hypertension  Last seen in clinic by myself 7/23  Low heart rate when seen by PT Seen by Dr. Saunders Revel July 15, 2022 for bradycardia in the setting of ectopy  Zio monitor ordered February 2024  - 14 days.   The predominant rhythm was sinus with an average rate of 69 bpm (range 47-95 bpm in sinus).   There frequent PAC's (PAC burden 19.2%) and rare PVC's.   30,778 supraventricular runs were recorded, lasting up to 4:10 minutes with a maximum rate of 179 bpm.   No prolonged pause was observed.   Patient triggered event corresponds to sinus rhythm with PAC's, PVC's, and PSVT.   In follow-up today he reports he is asymptomatic Denies tachypalpitations, no shortness of breath, no near-syncope or syncope, no leg edema no abdominal distention no PND orthopnea Unaware he is having arrhythmia to the extent as detailed above Symptoms of palpitations essentially resolved on the diltiazem ER 120 daily  No recent  echocardiogram available on file Followed by his chronic back pain Typically tries to stay very active at baseline, does not like to sit around  Blood pressure at home relatively well-controlled often 1 [id number] systolic, sometimes dipping 108  EKG personally reviewed by myself on todays visit Sinus bradycardia rate 56 bpm, PVCs  Other past medical history reviewed Event monitor June 2023 Normal sinus rhythm Patient had a min HR of 49 bpm, max HR of 184 bpm, and avg HR of 66 bpm.   38 Supraventricular Tachycardia/atrial tachycardia runs occurred, the run with the fastest interval lasting 5 beats with a max rate of 184 bpm, the longest lasting 18.1 secs with an avg rate of 156 bpm.     Idioventricular Rhythm was present.    Isolated SVEs were rare (<1.0%), SVE Couplets were rare (<1.0%), and SVE Triplets were rare (<1.0%). Isolated VEs were occasional (1.5%, [id number]), VE Couplets were rare (<1.0%, 109), and VE Triplets were rare (<1.0%, 43). Ventricular Bigeminy and Trigeminy were present.   4 patient triggered events noted, 2 associated with short run of narrow complex tachycardia, other triggered events with normal rhythm, PVCs  Syncope 07/24/20,wife heard him fall,got up too quickly Was working on the ground, got up quickly Concern for prerenal state   Lab Results  Component Value Date   CHOL 131 04/17/2022   HDL 45.50 04/17/2022   LDLCALC 54 10/13/2021   TRIG 205.0 (H) 04/17/2022    Past medical history reviewed  severe series of tachycardia episodes day after Thanksgiving 2016 Reported he was driving in a car back from a trip, tachycardia seem to happen every 5 minutes. He took several diltiazem, possibly up to 3 or 4 and eventually symptoms seem to improve.  Holter monitor showing frequent episodes of SVT, short-lived total cholesterol less than 150   Past Medical History:  Diagnosis Date   Arthritis    ? Gout   Bilateral carpal tunnel syndrome    managed with  nocturnal braces x 3 years   COPD (chronic obstructive pulmonary disease) (HCC)    Dysrhythmia    Tachycardia, PVCs, SVTs   GERD (gastroesophageal reflux disease)    Glucose intolerance (impaired glucose tolerance)    Heart murmur    as a child   History of syncope 2005   Unexplained, myoview negative   Hyperlipidemia    Hypertension    Iron deficiency anemia    Osteoarthritis of multiple joints    Palpitations 2010   Life Watch with single PVC   Pre-diabetes    Past Surgical History:  Procedure Laterality Date   APPENDECTOMY     1957   COLONOSCOPY     COLONOSCOPY WITH PROPOFOL N/A 01/24/2020   Procedure: COLONOSCOPY WITH PROPOFOL;  Surgeon: Lin Landsman, MD;  Location: [place];  Service: Gastroenterology;  Laterality: N/A;   JOINT REPLACEMENT     TONSILLECTOMY     1952   TOTAL KNEE ARTHROPLASTY Right 03/14/2019   Procedure: RIGHT TOTAL KNEE ARTHROPLASTY;  Surgeon: Meredith Pel, MD;  Location: [place];  Service: Orthopedics;  Laterality: Right;   TOTAL KNEE ARTHROPLASTY Left 06/22/2019   Procedure: LEFT TOTAL KNEE ARTHROPLASTY;  Surgeon: Meredith Pel, MD;  Location: [facility];  Service: Orthopedics;  Laterality: Left;     Current Meds  Medication Sig   amoxicillin (AMOXIL) 500 MG tablet Take 4 tablets by mouth (2,000 mg) 30-60 minutes prior to dental procedure.   aspirin EC 81 MG tablet Take 81 mg by mouth daily. Swallow whole.   Cyanocobalamin (VITAMIN B 12) 100 MCG LOZG Take by mouth daily.   diltiazem (CARDIZEM CD) 120 MG 24 hr capsule Take 1 capsule (120 mg total) by mouth daily.   hydrALAZINE (APRESOLINE) 25 MG tablet Take 1 tablet (25 mg total) by mouth 3 (three) times daily as needed (for pressure >150).   hydrochlorothiazide (HYDRODIURIL) 25 MG tablet Take 1 tablet (25 mg total) by mouth daily.   lidocaine (LIDODERM) 5 % Place 1 patch onto the skin daily. Remove & Discard patch within 12 hours or as directed by MD   metoprolol succinate  (TOPROL-XL) 50 MG 24 hr tablet Take 1 tablet (50 mg total) by mouth daily.   omeprazole (PRILOSEC) 20 MG capsule Take 1 capsule (20 mg total) by mouth daily.   rosuvastatin (CRESTOR) 10 MG tablet Take 10 mg by mouth daily.   sildenafil (VIAGRA) 50 MG tablet Take 1 tablet (50 mg total) by mouth daily as needed for erectile dysfunction.   telmisartan (MICARDIS) 80 MG tablet Take 1 tablet (80 mg total) by mouth daily.   timolol (TIMOPTIC) 0.5 % ophthalmic solution 1 drop 2 (two) times daily.   traMADol (ULTRAM) 50 MG tablet Take 2 tablets (100 mg total) by mouth every 6 (six) hours as needed.   valACYclovir (VALTREX) 1000 MG tablet Take 1-2 g by mouth daily as needed (cold sores/fever blisters).      Allergies:   Bee venom and Other   Social  History   Tobacco Use   Smoking status: Never   Smokeless tobacco: Never  Vaping Use   Vaping Use: Never used  Substance Use Topics   Alcohol use: Not Currently    Comment: 1 glass of wine every 3-4 days   Drug use: No     Family Hx: The patient's family history includes Colon cancer in his brother and paternal grandmother; Coronary artery disease in his mother; Heart attack in his mother; Heart failure in his father.  ROS:   Please see the history of present illness.    Review of Systems  HENT: Negative.    Respiratory: Negative.    Cardiovascular: Negative.   Gastrointestinal: Negative.   Musculoskeletal:  Positive for back pain.  Neurological: Negative.   Psychiatric/Behavioral: Negative.    All other systems reviewed and are negative.    Labs/Other Tests and Data Reviewed:    Recent Labs: 10/13/2021: Hemoglobin 14.1; Platelets 149.0; TSH 1.16 07/22/2022: ALT 59; BUN 17; Creatinine, Ser 0.92; Potassium 3.8; Sodium 136   Recent Lipid Panel Lab Results  Component Value Date/Time   CHOL 131 04/17/2022 10:15 AM   TRIG 205.0 (H) 04/17/2022 10:15 AM   TRIG 108 05/01/2009 12:00 AM   HDL 45.50 04/17/2022 10:15 AM   CHOLHDL 3 04/17/2022  10:15 AM   LDLCALC 54 10/13/2021 07:42 AM   LDLDIRECT 72.0 04/17/2022 10:15 AM    Wt Readings from Last 3 Encounters:  08/25/22 194 lb 6.4 oz (88.2 kg)  08/19/22 191 lb (86.6 kg)  07/30/22 197 lb 9.6 oz (89.6 kg)     Exam:    BP 130/80 (BP Location: Left Arm, Patient Position: Sitting, Cuff Size: Normal)   Pulse (!) 56   Ht 5\' 6"  (1.676 m)   Wt 194 lb 6.4 oz (88.2 kg)   SpO2 94%   BMI 31.38 kg/m  Constitutional:  oriented to person, place, and time. No distress.  HENT:  Head: Grossly normal Eyes:  no discharge. No scleral icterus.  Neck: No JVD, no carotid bruits  Cardiovascular: Regular rate and rhythm, no murmurs appreciated Pulmonary/Chest: Clear to auscultation bilaterally, no wheezes or rails Abdominal: Soft.  no distension.  no tenderness.  Musculoskeletal: Normal range of motion Neurological:  normal muscle tone. Coordination normal. No atrophy Skin: Skin warm and dry Psychiatric: normal affect, pleasant   ASSESSMENT & PLAN:    SVT (supraventricular tachycardia) (HCC) Frequent episodes of SVT on monitor Recommended updated echocardiogram and discussed with EP He might be open to starting antiarrhythmic medication Also discussed potential for ablation He does have underlying coronary calcification  HYPERTENSION, BENIGN Blood pressure is well controlled on today's visit. No changes made to the medications.  PVC's (premature ventricular contractions) Noted on monitor with frequent SVT episodes Continue diltiazem for now, echo then follow-up with EP  Mixed hyperlipidemia Cholesterol is at goal on the current lipid regimen. No changes to the medications were made.  CAD with stable angina Coronary calcification score 638 on scan in 2020 Currently with no symptoms of angina. No further workup at this time. Continue current medication regimen.  History of syncope/near syncope Prior episode of syncope early 2022, likely orthostasis hold  HCTZ for days when  there is less fluid intake Hydration on days with low blood pressure   Total encounter time more than 30 minutes  Greater than 50% was spent in counseling and coordination of care with the patient   Signed, Ida Rogue, MD  08/25/2022 11:43 AM    [facility]  [place] [facility] [address] [place] [address], [place], [place] [address]

## 2022-08-25 ENCOUNTER — Ambulatory Visit: Payer: Medicare PPO | Attending: Cardiovascular Disease | Admitting: Cardiovascular Disease

## 2022-08-25 ENCOUNTER — Encounter: Payer: Self-pay | Admitting: Cardiovascular Disease

## 2022-08-25 VITALS — BP 130/80 | HR 56 | Ht 66.0 in | Wt 194.4 lb

## 2022-08-25 DIAGNOSIS — I700 Atherosclerosis of aorta: Secondary | ICD-10-CM | POA: Diagnosis not present

## 2022-08-25 DIAGNOSIS — I493 Ventricular premature depolarization: Secondary | ICD-10-CM | POA: Diagnosis not present

## 2022-08-25 DIAGNOSIS — I10 Essential (primary) hypertension: Secondary | ICD-10-CM

## 2022-08-25 DIAGNOSIS — R7303 Prediabetes: Secondary | ICD-10-CM | POA: Diagnosis not present

## 2022-08-25 DIAGNOSIS — R001 Bradycardia, unspecified: Secondary | ICD-10-CM

## 2022-08-25 DIAGNOSIS — E782 Mixed hyperlipidemia: Secondary | ICD-10-CM

## 2022-08-25 DIAGNOSIS — I471 Supraventricular tachycardia, unspecified: Secondary | ICD-10-CM

## 2022-08-25 NOTE — Patient Instructions (Addendum)
Referral to Dr Quentin Ore , EP, appt after echo  Medication Instructions:  No changes  If you need a refill on your cardiac medications before your next appointment, please call your pharmacy.   Lab work: No new labs needed  Testing/Procedures: Your physician has requested that you have an echocardiogram. Echocardiography is a painless test that uses sound waves to create images of your heart. It provides your doctor with information about the size and shape of your heart and how well your heart's chambers and valves are working. This procedure takes approximately one hour. There are no restrictions for this procedure. Please do NOT wear cologne, perfume, aftershave, or lotions (deodorant is allowed). Please arrive 15 minutes prior to your appointment time.   Follow-Up: At [facility], you and your health needs are our priority.  As part of our continuing mission to provide you with exceptional heart care, we have created designated Provider Care Teams.  These Care Teams include your primary Cardiologist (physician) and Advanced Practice Providers (APPs -  Physician Assistants and Nurse Practitioners) who all work together to provide you with the care you need, when you need it.  You will need a follow up appointment in 6 months  Providers on your designated Care Team:   Murray Hodgkins, NP Christell Faith, PA-C Cadence Kathlen Mody, [place]  COVID-19 Vaccine Information can be found at: ShippingScam.[email or web address].uk For questions related to vaccine distribution or appointments, please email [email or web address] or call [phone])[phone].

## 2022-09-03 ENCOUNTER — Other Ambulatory Visit (INDEPENDENT_AMBULATORY_CARE_PROVIDER_SITE_OTHER): Payer: Medicare PPO

## 2022-09-03 DIAGNOSIS — K760 Fatty (change of) liver, not elsewhere classified: Secondary | ICD-10-CM | POA: Diagnosis not present

## 2022-09-03 DIAGNOSIS — R7303 Prediabetes: Secondary | ICD-10-CM | POA: Diagnosis not present

## 2022-09-03 LAB — COMPREHENSIVE METABOLIC PANEL
ALT: 47 U/L (ref 0–53)
AST: 40 U/L — ABNORMAL HIGH (ref 0–37)
Albumin: 4.3 g/dL (ref 3.5–5.2)
Alkaline Phosphatase: 68 U/L (ref 39–117)
BUN: 13 mg/dL (ref 6–23)
CO2: 29 mEq/L (ref 19–32)
Calcium: 8.9 mg/dL (ref 8.4–10.5)
Chloride: 100 mEq/L (ref 96–112)
Creatinine, Ser: 0.84 mg/dL (ref 0.40–1.50)
GFR: 84.42 mL/min (ref >60.00)
Glucose, Bld: 104 mg/dL — ABNORMAL HIGH (ref 70–99)
Potassium: 3.6 mEq/L (ref 3.5–5.1)
Sodium: 137 mEq/L (ref 135–145)
Total Bilirubin: 1.1 mg/dL (ref 0.2–1.2)
Total Protein: 6.5 g/dL (ref 6.0–8.3)

## 2022-09-03 LAB — LIPID PANEL
Cholesterol: 119 mg/dL (ref 0–200)
HDL: 44.6 mg/dL (ref >39.00)
LDL Cholesterol: 50 mg/dL (ref 0–99)
NonHDL: 74.57
Total CHOL/HDL Ratio: 3
Triglycerides: 123 mg/dL (ref 0.0–149.0)
VLDL: 24.6 mg/dL (ref 0.0–40.0)

## 2022-09-03 LAB — HEMOGLOBIN A1C: Hgb A1c MFr Bld: 6.4 % (ref 4.6–6.5)

## 2022-09-15 ENCOUNTER — Encounter: Payer: Self-pay | Admitting: Internal Medicine

## 2022-09-24 ENCOUNTER — Ambulatory Visit: Payer: Medicare PPO | Attending: Cardiovascular Disease

## 2022-09-24 DIAGNOSIS — I340 Nonrheumatic mitral (valve) insufficiency: Secondary | ICD-10-CM | POA: Diagnosis not present

## 2022-09-24 DIAGNOSIS — I493 Ventricular premature depolarization: Secondary | ICD-10-CM

## 2022-09-24 DIAGNOSIS — I351 Nonrheumatic aortic (valve) insufficiency: Secondary | ICD-10-CM | POA: Diagnosis not present

## 2022-09-24 DIAGNOSIS — I471 Supraventricular tachycardia, unspecified: Secondary | ICD-10-CM

## 2022-09-24 LAB — ECHOCARDIOGRAM COMPLETE
AR max vel: 2.62 cm2
AV Area VTI: 2.68 cm2
AV Area mean vel: 2.65 cm2
AV Mean grad: 12.5 mmHg
AV Peak grad: 21.8 mmHg
Ao pk vel: 2.34 m/s
Area-P 1/2: 2.29 cm2
S' Lateral: 3 cm

## 2022-09-30 ENCOUNTER — Ambulatory Visit: Payer: Medicare PPO | Attending: Cardiology | Admitting: Cardiology

## 2022-09-30 ENCOUNTER — Encounter: Payer: Self-pay | Admitting: Cardiology

## 2022-09-30 VITALS — BP 124/76 | HR 73 | Ht 67.0 in | Wt 192.0 lb

## 2022-09-30 DIAGNOSIS — I10 Essential (primary) hypertension: Secondary | ICD-10-CM | POA: Diagnosis not present

## 2022-09-30 DIAGNOSIS — I700 Atherosclerosis of aorta: Secondary | ICD-10-CM | POA: Diagnosis not present

## 2022-09-30 DIAGNOSIS — I471 Supraventricular tachycardia, unspecified: Secondary | ICD-10-CM

## 2022-09-30 MED ORDER — DILTIAZEM HCL ER COATED BEADS 120 MG PO CP24: 120.0000 mg | ORAL_CAPSULE | Freq: Two times a day (BID) | ORAL | 3 refills | Status: DC

## 2022-09-30 NOTE — Progress Notes (Signed)
  Electrophysiology Office Note:    Date:  09/30/2022   ID:  Mark Farley Mark Farley., DOB 24-May-1946, MRN [id number]  [facility] Cardiologist:  Julien Nordmann, MD  [facility] Electrophysiologist:  Lanier Prude, MD   Referring MD: Antonieta Iba, MD   Chief Complaint: Syncope, SVT  History of Present Illness:    Mark Farley. is a 77 y.o. male who I am seeing today for an evaluation of syncope and SVT at the request of Dr. Mariah Milling.  The patient has a medical history that includes hypertension, hyperlipidemia, coronary artery disease.  The patient syncopal episode occurred in February 2022.  He stood up quickly and then lost consciousness.  He was thought to be dehydrated during his evaluation.  Today he tells me that since starting his diltiazem he has noticed less of the arrhythmia.  He tolerates the diltiazem and metoprolol well without off target effects.      Their past medical, social and family history was reveiwed.   ROS:   Please see the history of present illness.    All other systems reviewed and are negative.  EKGs/Labs/Other Studies Reviewed:    The following studies were reviewed today:  September 24, 2022 echo EF 55%.  RV normal.  Mild to moderate MR.  March 2024 ZIO monitor personally reviewed 19.2% PAC burden.  Rare PVCs.  Salvos of atrial tachycardia  EKG from August 25, 2022 personally reviewed and shows sinus rhythm/bradycardia with a single PVC.  Physical Exam:    VS:  BP 124/76   Pulse 73   Ht 5\' 7"  (1.702 m)   Wt 192 lb (87.1 kg)   SpO2 94%   BMI 30.07 kg/m     Wt Readings from Last 3 Encounters:  09/30/22 192 lb (87.1 kg)  08/25/22 194 lb 6.4 oz (88.2 kg)  08/19/22 191 lb (86.6 kg)     GEN:  Well nourished, well developed in no acute distress CARDIAC: RRR, no murmurs, rubs, gallops RESPIRATORY:  Clear to auscultation without rales, wheezing or rhonchi       ASSESSMENT AND PLAN:    1. SVT (supraventricular  tachycardia)   2. Aortic atherosclerosis (HCC)   3. Primary hypertension     #SVT #Atrial tachycardia #PACs The patient has relatively frequent atrial ectopy and salvos of SVT.  Appearance of the SVT suggests atrial tachycardia.  These seem to have improved after starting his calcium channel blocker.  Given his coronary artery disease on cross-sectional imaging, class Ic agents are not available.  Given the relatively asymptomatic nature of his arrhythmia, I would not recommend amiodarone at this time.  For now, recommend increasing his diltiazem to twice daily.  #Hypertension At goal today.  Recommend checking blood pressures 1-2 times per week at home and recording the values.  Recommend bringing these recordings to the primary care physician.  Follow-up with EP on an as-needed basis.     Signed, Rossie Muskrat. Lalla Brothers, MD, [facility], [facility] 09/30/2022 8:57 PM    Electrophysiology [facility]

## 2022-09-30 NOTE — Patient Instructions (Signed)
Medication Instructions:  Your physician has recommended you make the following change in your medication:  1) INCREASE diltiazem to 120 mg twice daily *If you need a refill on your cardiac medications before your next appointment, please call your pharmacy*  Follow-Up: At [facility], you and your health needs are our priority.  As part of our continuing mission to provide you with exceptional heart care, we have created designated Provider Care Teams.  These Care Teams include your primary Cardiologist (physician) and Advanced Practice Providers (APPs -  Physician Assistants and Nurse Practitioners) who all work together to provide you with the care you need, when you need it.  Your next appointment:   As needed with Dr. Lalla Brothers

## 2022-10-01 ENCOUNTER — Encounter: Payer: Self-pay | Admitting: Cardiology

## 2022-11-02 ENCOUNTER — Encounter: Payer: Self-pay | Admitting: Internal Medicine

## 2022-11-02 DIAGNOSIS — R7401 Elevation of levels of liver transaminase levels: Secondary | ICD-10-CM

## 2022-11-02 DIAGNOSIS — K760 Fatty (change of) liver, not elsewhere classified: Secondary | ICD-10-CM

## 2022-11-02 DIAGNOSIS — R748 Abnormal levels of other serum enzymes: Secondary | ICD-10-CM

## 2022-11-02 NOTE — Telephone Encounter (Signed)
I have pended referral for your approval.  

## 2022-11-04 DIAGNOSIS — R7401 Elevation of levels of liver transaminase levels: Secondary | ICD-10-CM | POA: Insufficient documentation

## 2022-11-06 ENCOUNTER — Encounter: Payer: Medicare PPO | Admitting: Internal Medicine

## 2022-11-10 ENCOUNTER — Encounter: Payer: Self-pay | Admitting: Internal Medicine

## 2022-11-10 DIAGNOSIS — R748 Abnormal levels of other serum enzymes: Secondary | ICD-10-CM

## 2022-11-10 NOTE — Telephone Encounter (Signed)
I have pended a CMP. Is there anything else that needs to be ordered?

## 2022-11-13 ENCOUNTER — Other Ambulatory Visit (INDEPENDENT_AMBULATORY_CARE_PROVIDER_SITE_OTHER): Payer: Medicare PPO

## 2022-11-13 DIAGNOSIS — K760 Fatty (change of) liver, not elsewhere classified: Secondary | ICD-10-CM

## 2022-11-13 LAB — COMPREHENSIVE METABOLIC PANEL
ALT: 43 U/L (ref 0–53)
AST: 33 U/L (ref 0–37)
Albumin: 4.2 g/dL (ref 3.5–5.2)
Alkaline Phosphatase: 59 U/L (ref 39–117)
BUN: 12 mg/dL (ref 6–23)
CO2: 29 mEq/L (ref 19–32)
Calcium: 9.4 mg/dL (ref 8.4–10.5)
Chloride: 96 mEq/L (ref 96–112)
Creatinine, Ser: 0.84 mg/dL (ref 0.40–1.50)
GFR: 84.3 mL/min (ref >60.00)
Glucose, Bld: 168 mg/dL — ABNORMAL HIGH (ref 70–99)
Potassium: 3.9 mEq/L (ref 3.5–5.1)
Sodium: 133 mEq/L — ABNORMAL LOW (ref 135–145)
Total Bilirubin: 1 mg/dL (ref 0.2–1.2)
Total Protein: 7.2 g/dL (ref 6.0–8.3)

## 2022-11-19 ENCOUNTER — Encounter: Payer: Medicare PPO | Admitting: Internal Medicine

## 2022-11-27 ENCOUNTER — Ambulatory Visit (INDEPENDENT_AMBULATORY_CARE_PROVIDER_SITE_OTHER): Payer: Medicare PPO | Admitting: Internal Medicine

## 2022-11-27 ENCOUNTER — Encounter: Payer: Self-pay | Admitting: Internal Medicine

## 2022-11-27 VITALS — BP 132/80 | HR 57 | Temp 97.9°F | Ht 67.0 in | Wt 197.2 lb

## 2022-11-27 DIAGNOSIS — K760 Fatty (change of) liver, not elsewhere classified: Secondary | ICD-10-CM

## 2022-11-27 DIAGNOSIS — I480 Paroxysmal atrial fibrillation: Secondary | ICD-10-CM | POA: Diagnosis not present

## 2022-11-27 DIAGNOSIS — R7303 Prediabetes: Secondary | ICD-10-CM

## 2022-11-27 DIAGNOSIS — I700 Atherosclerosis of aorta: Secondary | ICD-10-CM

## 2022-11-27 DIAGNOSIS — N529 Male erectile dysfunction, unspecified: Secondary | ICD-10-CM | POA: Diagnosis not present

## 2022-11-27 DIAGNOSIS — N401 Enlarged prostate with lower urinary tract symptoms: Secondary | ICD-10-CM | POA: Diagnosis not present

## 2022-11-27 DIAGNOSIS — N3289 Other specified disorders of bladder: Secondary | ICD-10-CM | POA: Diagnosis not present

## 2022-11-27 DIAGNOSIS — N138 Other obstructive and reflux uropathy: Secondary | ICD-10-CM | POA: Diagnosis not present

## 2022-11-27 DIAGNOSIS — I10 Essential (primary) hypertension: Secondary | ICD-10-CM | POA: Diagnosis not present

## 2022-11-27 DIAGNOSIS — Z0000 Encounter for general adult medical examination without abnormal findings: Secondary | ICD-10-CM

## 2022-11-27 MED ORDER — DOXYCYCLINE HYCLATE 100 MG PO TABS: 100.0000 mg | ORAL_TABLET | Freq: Two times a day (BID) | ORAL | 0 refills | Status: DC

## 2022-11-27 MED ORDER — OMEPRAZOLE 20 MG PO CPDR: 20.0000 mg | DELAYED_RELEASE_CAPSULE | Freq: Every day | ORAL | 3 refills | Status: DC

## 2022-11-27 NOTE — Patient Instructions (Signed)
  You can take  up to 2000 mg of acetominophen (tylenol) every day safely  In divided doses (500 mg every 6 hours  Or 1000 mg every 12 hours.)   Ok to continue using celebrex every other day

## 2022-11-27 NOTE — Assessment & Plan Note (Addendum)
Managed with low GI diet, weight loss and exercise.  a1c is stable .  He has resumed  statin  and LFTs are normal.  Follow up with GI in Sepember for cop eval    Lab Results  Component Value Date   HGBA1C 6.4 09/03/2022

## 2022-11-27 NOTE — Progress Notes (Unsigned)
Patient ID: Mark Farley., male    DOB: 1945-07-24  Age: 77 y.o. MRN: [id number]  The patient is here for annual preventive examination and management of other chronic and acute problems.   The risk factors are reflected in the social history.   The roster of all physicians providing medical care to patient - is listed in the Snapshot section of the chart.   Activities of daily living:  The patient is 100% independent in all ADLs: dressing, toileting, feeding as well as independent mobility   Home safety : The patient has smoke detectors in the home. They wear seatbelts.  There are no unsecured firearms at home. There is no violence in the home.    There is no risks for hepatitis, STDs or HIV. There is no   history of blood transfusion. They have no travel history to infectious disease endemic areas of the world.   The patient has seen their dentist in the last six month. They have seen their eye doctor in the last year. The patinet  denies slight hearing difficulty with regard to whispered voices and some television programs.  They have deferred audiologic testing in the last year.  They do not  have excessive sun exposure. Discussed the need for sun protection: hats, long sleeves and use of sunscreen if there is significant sun exposure.    Diet: the importance of a healthy diet is discussed. They do have a healthy diet.   The benefits of regular aerobic exercise were discussed. The patient  exercises  3 to 5 days per week  for  60 minutes.    Depression screen: there are no signs or vegative symptoms of depression- irritability, change in appetite, anhedonia, sadness/tearfullness.   The following portions of the patient's history were reviewed and updated as appropriate: allergies, current medications, past family history, past medical history,  past surgical history, past social history  and problem list.   Visual acuity was not assessed per patient preference since the patient has  regular follow up with an  ophthalmologist. Hearing and body mass index were assessed and reviewed.    During the course of the visit the patient was educated and counseled about appropriate screening and preventive services including : fall prevention , diabetes screening, nutrition counseling, colorectal cancer screening, and recommended immunizations.    Chief Complaint:  Reviewed findings of prior CT scan today..  Patient is tolerating high potency statin therapy    Fatty liver:    An episode of low BP infrequently,  usually 120/75      Review of Symptoms  Patient denies headache, fevers, malaise, unintentional weight loss, skin rash, eye pain, sinus congestion and sinus pain, sore throat, dysphagia,  hemoptysis , cough, dyspnea, wheezing, chest pain, palpitations, orthopnea, edema, abdominal pain, nausea, melena, diarrhea, constipation, flank pain, dysuria, hematuria, urinary  Frequency, nocturia, numbness, tingling, seizures,  Focal weakness, Loss of consciousness,  Tremor, insomnia, depression, anxiety, and suicidal ideation.    Physical Exam:  BP 132/80   Pulse (!) 57   Temp 97.9 F (36.6 C) (Oral)   Ht 5\' 7"  (1.702 m)   Wt 197 lb 3.2 oz (89.4 kg)   SpO2 94%   BMI 30.89 kg/m    Physical Exam  Assessment and Plan: There are no diagnoses linked to this encounter.  No follow-ups on file.  Sherlene Shams, MD

## 2022-11-27 NOTE — Assessment & Plan Note (Signed)
Reviewed findings of prior CT scan today..  Patient is tolerating high potency statin therapy with crestor   Lab Results  Component Value Date   CHOL 119 09/03/2022   HDL 44.60 09/03/2022   LDLCALC 50 09/03/2022   LDLDIRECT 72.0 04/17/2022   TRIG 123.0 09/03/2022   CHOLHDL 3 09/03/2022

## 2022-11-27 NOTE — Assessment & Plan Note (Addendum)
SVT by ZIO monitor .  Had an episode of bradycardia , asymptomatic .occurring during PT, was seen by cardiology

## 2022-11-29 NOTE — Assessment & Plan Note (Addendum)
[place] suggested ,  with  nodular changes suggestive of cirrhosis .  LFTS are normalized with weight loss.  Tolerating statin.  Advised to abstain from alcohol as much as possible . GI referral in progress.  Will need annual surveillance for hepatocellular CA

## 2022-11-29 NOTE — Assessment & Plan Note (Signed)

## 2022-11-29 NOTE — Assessment & Plan Note (Addendum)
Follow up PVR was 183 one month later after starting Flomax   He saw no benefit to Flomax and had significant side effects.  Medication was discontinued

## 2022-11-29 NOTE — Assessment & Plan Note (Addendum)
Managed with Viagra.

## 2022-11-29 NOTE — Assessment & Plan Note (Signed)
Home readings have been < 130/90 on current regimen of telmisartan , metoprolol  and diltiazem.no changes today

## 2022-11-29 NOTE — Assessment & Plan Note (Signed)
Secondaru o mild bladder retention and BPH.  He was prescribed Flomax by Urology in [facility].

## 2023-01-22 ENCOUNTER — Encounter: Payer: Self-pay | Admitting: Cardiovascular Disease

## 2023-01-22 MED ORDER — HYDROCHLOROTHIAZIDE 25 MG PO TABS: 25.0000 mg | ORAL_TABLET | Freq: Every day | ORAL | 0 refills | Status: DC

## 2023-01-22 MED ORDER — TELMISARTAN 80 MG PO TABS: 80.0000 mg | ORAL_TABLET | Freq: Every day | ORAL | 0 refills | Status: DC

## 2023-01-29 ENCOUNTER — Other Ambulatory Visit: Payer: Self-pay | Admitting: Medical Genetics

## 2023-01-29 DIAGNOSIS — R7303 Prediabetes: Secondary | ICD-10-CM | POA: Diagnosis not present

## 2023-01-29 DIAGNOSIS — M5416 Radiculopathy, lumbar region: Secondary | ICD-10-CM | POA: Diagnosis not present

## 2023-01-29 DIAGNOSIS — Z006 Encounter for examination for normal comparison and control in clinical research program: Secondary | ICD-10-CM

## 2023-02-09 DIAGNOSIS — M5442 Lumbago with sciatica, left side: Secondary | ICD-10-CM | POA: Diagnosis not present

## 2023-02-09 DIAGNOSIS — M5416 Radiculopathy, lumbar region: Secondary | ICD-10-CM | POA: Diagnosis not present

## 2023-02-09 DIAGNOSIS — M5441 Lumbago with sciatica, right side: Secondary | ICD-10-CM | POA: Diagnosis not present

## 2023-02-09 DIAGNOSIS — G8929 Other chronic pain: Secondary | ICD-10-CM | POA: Diagnosis not present

## 2023-02-10 ENCOUNTER — Other Ambulatory Visit: Payer: Self-pay

## 2023-02-10 ENCOUNTER — Ambulatory Visit (INDEPENDENT_AMBULATORY_CARE_PROVIDER_SITE_OTHER): Payer: Medicare PPO

## 2023-02-10 DIAGNOSIS — Z23 Encounter for immunization: Secondary | ICD-10-CM | POA: Diagnosis not present

## 2023-02-15 ENCOUNTER — Encounter: Payer: Self-pay | Admitting: Gastroenterology

## 2023-02-15 ENCOUNTER — Ambulatory Visit (INDEPENDENT_AMBULATORY_CARE_PROVIDER_SITE_OTHER): Payer: Medicare PPO | Admitting: Gastroenterology

## 2023-02-15 ENCOUNTER — Other Ambulatory Visit: Payer: Self-pay

## 2023-02-15 VITALS — BP 126/76 | HR 52 | Temp 97.9°F | Ht 67.0 in | Wt 198.1 lb

## 2023-02-15 DIAGNOSIS — K7581 Nonalcoholic steatohepatitis (NASH): Secondary | ICD-10-CM | POA: Diagnosis not present

## 2023-02-15 DIAGNOSIS — I8500 Esophageal varices without bleeding: Secondary | ICD-10-CM

## 2023-02-15 DIAGNOSIS — K7469 Other cirrhosis of liver: Secondary | ICD-10-CM | POA: Diagnosis not present

## 2023-02-15 NOTE — Patient Instructions (Addendum)
You ultrasound is schedule for ultrasound on 02/20/2023 arrive at 8:15am for 8:30am [facility]. Nothing to eat or drink after midnight. If you need to reschedule please call [phone] option 3 and then option 2.

## 2023-02-15 NOTE — Progress Notes (Signed)
Mark Repress, MD [address]  [address]  [place], [place] [address]  Main: [phone]  Fax: [phone]    Gastroenterology Consultation  Referring Provider:     Sherlene Shams, MD Primary Care Physician:  Sherlene Shams, MD Primary Gastroenterologist:  Dr. Arlyss Farley Reason for Consultation:     Elevated LFTs, ?  Cirrhosis of liver        HPI:   Mark Farley. is a 77 y.o. Caucasian male referred by Dr. Sherlene Shams, MD  for consultation & management of elevated LFTs.  Patient is first noted to have mildly elevated transaminases in 01/2017, AST 46, ALT 73.  His transaminases have been persistently elevated since 03/2018 about 2-3 times upper limit of normal.  Work-up thus far revealed elevated ferritin levels only.  Hep C antibody negative, he received Twinrix vaccine first dose.  ANA, anti-smooth muscle and antimitochondrial antibodies negative.  Patient was recommended to lose 10% of his body weight which he tried to but regained it back when he went to cruise.  He previously had low ferritin levels as well as anemia for which he underwent work-up which was negative for thalassemia.  TSH normal, hemoglobin A1c normal.  He underwent ultrasound abdomen which revealed fatty liver only He reports drinking 1-2 beers daily and sometimes wine for several years.  He does not smoke He denies using herbal supplements.  He is a Cytogeneticist.  He denies IV drug abuse, no history of tattoo or blood transfusions.  He is here to discuss about abnormal ultrasound showing nodularity of the liver as of 06/2022.  He also has mild thrombocytopenia.  Patient has been gaining weight, does admit to eating more bread, pasta because he is [place].  He does drink carbonated beverages daily.  He does go to gym on a regular basis.  He does have 3 glasses of wine per week.  Most recent LFTs as of 10/2022 were normal.       Latest Ref Rng & Units 11/13/2022   10:25 AM 09/03/2022    8:39 AM  07/22/2022    7:33 AM  Hepatic Function  Total Protein 6.0 - 8.3 g/dL 7.2  6.5  6.3   Albumin 3.5 - 5.2 g/dL 4.2  4.3  4.1   AST 0 - 37 U/L 33  40  36   ALT 0 - 53 U/L 43  47  59   Alk Phosphatase 39 - 117 U/L 59  68  54   Total Bilirubin 0.2 - 1.2 mg/dL 1.0  1.1  1.2    NSAIDs: None  Antiplts/Anticoagulants/Anti thrombotics: None  GI Procedures: Colonoscopy in 12/2014 in [place], [place] Polyps were detected  Past Medical History:  Diagnosis Date   Arthritis    ? Gout   Bilateral carpal tunnel syndrome    managed with nocturnal braces x 3 years   COPD (chronic obstructive pulmonary disease) (HCC)    Dysrhythmia    Tachycardia, PVCs, SVTs   GERD (gastroesophageal reflux disease)    Glucose intolerance (impaired glucose tolerance)    Heart murmur    as a child   History of syncope 2005   Unexplained, myoview negative   Hyperlipidemia    Hypertension    Iron deficiency anemia    Osteoarthritis of multiple joints    Palpitations 2010   Life Watch with single PVC   Pre-diabetes     Past Surgical History:  Procedure Laterality Date  APPENDECTOMY     1957   COLONOSCOPY     COLONOSCOPY WITH PROPOFOL N/A 01/24/2020   Procedure: COLONOSCOPY WITH PROPOFOL;  Surgeon: Toney Reil, MD;  Location[facility] ENDOSCOPY;  Service: Gastroenterology;  Laterality: N/A;   JOINT REPLACEMENT     TONSILLECTOMY     1952   TOTAL KNEE ARTHROPLASTY Right 03/14/2019   Procedure: RIGHT TOTAL KNEE ARTHROPLASTY;  Surgeon: Cammy Copa, MD;  Location: [facility] OR;  Service: Orthopedics;  Laterality: Right;   TOTAL KNEE ARTHROPLASTY Left 06/22/2019   Procedure: LEFT TOTAL KNEE ARTHROPLASTY;  Surgeon: Cammy Copa, MD;  Location: [facility] OR;  Service: Orthopedics;  Laterality: Left;     Current Outpatient Medications:    amoxicillin (AMOXIL) 500 MG tablet, Take 4 tablets by mouth (2,000 mg) 30-60 minutes prior to dental procedure., Disp: 10 tablet, Rfl: 0   aspirin EC 81 MG tablet,  Take 81 mg by mouth daily. Swallow whole., Disp: , Rfl:    Cyanocobalamin (VITAMIN B 12) 100 MCG LOZG, Take by mouth daily., Disp: , Rfl:    diltiazem (CARDIZEM CD) 120 MG 24 hr capsule, Take 1 capsule (120 mg total) by mouth in the morning and at bedtime., Disp: 180 capsule, Rfl: 3   dorzolamide-timolol (COSOPT) 2-0.5 % ophthalmic solution, Place 1 drop into both eyes 2 (two) times daily., Disp: , Rfl:    hydrochlorothiazide (HYDRODIURIL) 25 MG tablet, Take 1 tablet (25 mg total) by mouth daily., Disp: 90 tablet, Rfl: 0   lidocaine (LIDODERM) 5 %, Place 1 patch onto the skin daily. Remove & Discard patch within 12 hours or as directed by MD, Disp: 90 patch, Rfl: 3   metoprolol succinate (TOPROL-XL) 50 MG 24 hr tablet, Take 1 tablet (50 mg total) by mouth daily., Disp: 90 tablet, Rfl: 3   omeprazole (PRILOSEC) 20 MG capsule, Take 1 capsule (20 mg total) by mouth daily., Disp: 90 capsule, Rfl: 3   rosuvastatin (CRESTOR) 10 MG tablet, Take 10 mg by mouth daily., Disp: , Rfl:    sildenafil (VIAGRA) 50 MG tablet, Take 1 tablet (50 mg total) by mouth daily as needed for erectile dysfunction., Disp: 10 tablet, Rfl: 2   telmisartan (MICARDIS) 80 MG tablet, Take 1 tablet (80 mg total) by mouth daily., Disp: 90 tablet, Rfl: 0   timolol (TIMOPTIC) 0.5 % ophthalmic solution, 1 drop 2 (two) times daily., Disp: , Rfl:    traMADol (ULTRAM) 50 MG tablet, Take 2 tablets (100 mg total) by mouth every 6 (six) hours as needed., Disp: 240 tablet, Rfl: 0   valACYclovir (VALTREX) 1000 MG tablet, Take 1-2 g by mouth daily as needed (cold sores/fever blisters). , Disp: , Rfl:    celecoxib (CELEBREX) 100 MG capsule, Take 100 mg by mouth every other day., Disp: , Rfl:    EPINEPHrine (EPIPEN 2-PAK) 0.3 mg/0.3 mL IJ SOAJ injection, Inject 0.3 mg into the muscle as needed for anaphylaxis., Disp: , Rfl:    hydrALAZINE (APRESOLINE) 25 MG tablet, Take 1 tablet (25 mg total) by mouth 3 (three) times daily as needed (for pressure  >150)., Disp: 30 tablet, Rfl: 3  Family History  Problem Relation Age of Onset   Coronary artery disease Mother    Heart attack Mother        MI   Heart failure Father        CHF   Colon cancer Brother    Colon cancer Paternal Grandmother      Social History   Tobacco  Use   Smoking status: Never   Smokeless tobacco: Never  Vaping Use   Vaping status: Never Used  Substance Use Topics   Alcohol use: Yes    Comment: 1 glass of wine or a beer 2 to 3 days a week   Drug use: No    Allergies as of 02/15/2023 - Review Complete 02/15/2023  Allergen Reaction Noted   Bee venom Swelling and Rash 04/02/2012   Other Rash 04/02/2012    Review of Systems:    All systems reviewed and negative except where noted in HPI.   Physical Exam:  BP 126/76 (BP Location: Left Arm, Patient Position: Sitting, Cuff Size: Normal)   Pulse (!) 52   Temp 97.9 F (36.6 C) (Oral)   Ht 5\' 7"  (1.702 m)   Wt 198 lb 2 oz (89.9 kg)   BMI 31.03 kg/m  No LMP for male patient.  General:   Alert,  Well-developed, well-nourished, pleasant and cooperative in NAD Head:  Normocephalic and atraumatic. Eyes:  Sclera clear, no icterus.   Conjunctiva pink. Ears:  Normal auditory acuity. Nose:  No deformity, discharge, or lesions. Mouth:  No deformity or lesions,oropharynx pink & moist. Neck:  Supple; no masses or thyromegaly. Lungs:  Respirations even and unlabored.  Clear throughout to auscultation.   No wheezes, crackles, or rhonchi. No acute distress. Heart:  Regular rate and rhythm; no murmurs, clicks, rubs, or gallops. Abdomen:  Normal bowel sounds. Soft, non-tender and non-distended without masses, hepatosplenomegaly or hernias noted.  No guarding or rebound tenderness.   Rectal: Not performed Msk:  Symmetrical without gross deformities. Good, equal movement & strength bilaterally. Pulses:  Normal pulses noted. Extremities:  No clubbing or edema.  No cyanosis. Neurologic:  Alert and oriented x3;  grossly  normal neurologically. Skin:  Intact without significant lesions or rashes. No jaundice. Psych:  Alert and cooperative. Normal mood and affect.  Imaging Studies: Reviewed  Assessment and Plan:   Cayne Kless. is a 77 y.o. Caucasian male with obesity, prediabetes, hypertension, fatty liver disease Secondary liver disease workup is negative.  Ultrasound of the liver from 06/2022 revealed nodular counter raising the possibility of cirrhosis of liver  -Advised him to follow healthy diet and try to lose 10% of his body weight -Recommend ultrasound liver Doppler to evaluate for portal hypertension, splenomegaly Check serum AFP levels, PT/INR Recommend EGD for variceal screening -Received vaccination against hepatitis A and B   Follow up in 6 months   Mark Repress, MD

## 2023-02-16 LAB — PROTIME-INR
INR: 1 (ref 0.9–1.2)
Prothrombin Time: 11.6 s (ref 9.1–12.0)

## 2023-02-16 LAB — AFP TUMOR MARKER: AFP, Serum, Tumor Marker: 5.5 ng/mL (ref 0.0–8.4)

## 2023-02-19 ENCOUNTER — Ambulatory Visit
Admission: RE | Admit: 2023-02-19 | Discharge: 2023-02-19 | Disposition: A | Discharge status: Home / Self Care | Payer: Medicare PPO | Source: Ambulatory Visit | Attending: Gastroenterology | Admitting: Gastroenterology

## 2023-02-19 DIAGNOSIS — K7689 Other specified diseases of liver: Secondary | ICD-10-CM | POA: Diagnosis not present

## 2023-02-19 DIAGNOSIS — K7469 Other cirrhosis of liver: Secondary | ICD-10-CM | POA: Insufficient documentation

## 2023-02-19 DIAGNOSIS — K7460 Unspecified cirrhosis of liver: Secondary | ICD-10-CM | POA: Diagnosis not present

## 2023-02-26 ENCOUNTER — Encounter: Payer: Self-pay | Admitting: Cardiovascular Disease

## 2023-02-26 ENCOUNTER — Ambulatory Visit: Payer: Medicare PPO | Attending: Cardiovascular Disease | Admitting: Cardiovascular Disease

## 2023-02-26 ENCOUNTER — Encounter: Payer: Self-pay | Admitting: Gastroenterology

## 2023-02-26 VITALS — BP 140/84 | HR 57 | Ht 66.5 in | Wt 199.1 lb

## 2023-02-26 DIAGNOSIS — R7303 Prediabetes: Secondary | ICD-10-CM | POA: Diagnosis not present

## 2023-02-26 DIAGNOSIS — I471 Supraventricular tachycardia, unspecified: Secondary | ICD-10-CM | POA: Diagnosis not present

## 2023-02-26 DIAGNOSIS — I700 Atherosclerosis of aorta: Secondary | ICD-10-CM | POA: Diagnosis not present

## 2023-02-26 DIAGNOSIS — I10 Essential (primary) hypertension: Secondary | ICD-10-CM

## 2023-02-26 DIAGNOSIS — E782 Mixed hyperlipidemia: Secondary | ICD-10-CM

## 2023-02-26 MED ORDER — HYDROCHLOROTHIAZIDE 25 MG PO TABS: 25.0000 mg | ORAL_TABLET | Freq: Every day | ORAL | 3 refills | Status: DC

## 2023-02-26 MED ORDER — TELMISARTAN 80 MG PO TABS: 80.0000 mg | ORAL_TABLET | Freq: Every day | ORAL | 3 refills | Status: DC

## 2023-02-26 NOTE — Progress Notes (Signed)
Evaluation Performed:  Follow-up visit  Date:  02/26/2023   ID:  Mark Farley., DOB Oct 23, 1945, MRN [id number]  Patient Location:  [address] [place] [address]   Provider location:   [facility], [place] office  PCP:  Sherlene Shams, MD  Cardiologist:  Fonnie Mu   Chief Complaint  Patient presents with   6 month follow up     "Doing well." Medications reviewed by the patient verbally.     History of Present Illness:    Mark Farley. is a 77 y.o. male past medical history of HTN,  hyperlipidemia previous syncope, negative Myoview in 2005. h/o palpitations and had a Life Watch monitor for several weeks but no events arthritis,  anemia from iron deficiency Holter monitor showing runs of SVT, rate up to 150 bpm CT coronary calcium scoring July 2020,Score 638, 73rd percentile Aorta with mild atherosclerosis on CT He presents today for follow-up of his palpitations, hypertension, CAD  Last seen in clinic by myself March 2024  Low heart rate when seen by PT Seen by Dr. Okey Dupre July 15, 2022 for bradycardia in the setting of ectopy Zio monitor with frequent PACs, SVT runs  Seen by EP May 2024 Diltiazem increased up to 120 mg twice a day   Works out at [employer], gym 4-5 a week Aerobic mostly Reports that he feels well with no significant shortness of breath or chest pain on exertion Blood pressure stable, does not get high readings No lower extremity edema, no PND orthopnea  Denies chest pain concerning for angina on exertion No near-syncope or syncope  EKG personally reviewed by myself on todays visit EKG Interpretation Date/Time:  Friday February 26 2023 08:19:03 EDT Ventricular Rate:  57 PR Interval:  222 QRS Duration:  98 QT Interval:  452 QTC Calculation: 439 R Axis:   19  Text Interpretation: Sinus bradycardia with 1st degree A-V block When compared with ECG of 10-Mar-2019 12:01, PR interval has  increased Confirmed by Julien Nordmann [phone]) on 02/26/2023 8:23:15 AM    Other past medical history reviewed Zio monitor ordered February 2024  - 14 days.   The predominant rhythm was sinus with an average rate of 69 bpm (range 47-95 bpm in sinus).   There frequent PAC's (PAC burden 19.2%) and rare PVC's.   30,778 supraventricular runs were recorded, lasting up to 4:10 minutes with a maximum rate of 179 bpm.   No prolonged pause was observed.   Patient triggered event corresponds to sinus rhythm with PAC's, PVC's, and PSVT. Home Event monitor June 2023 Normal sinus rhythm Patient had a min HR of 49 bpm, max HR of 184 bpm, and avg HR of 66 bpm.   38 Supraventricular Tachycardia/atrial tachycardia runs occurred, the run with the fastest interval lasting 5 beats with a max rate of 184 bpm, the longest lasting 18.1 secs with an avg rate of 156 bpm.     Idioventricular Rhythm was present.    Isolated SVEs were rare (<1.0%), SVE Couplets were rare (<1.0%), and SVE Triplets were rare (<1.0%). Isolated VEs were occasional (1.5%, [id number]), VE Couplets were rare (<1.0%, 109), and VE Triplets were rare (<1.0%, 43). Ventricular Bigeminy and Trigeminy were present.   4 patient triggered events noted, 2 associated with short run of narrow complex tachycardia, other triggered events with normal rhythm, PVCs  Syncope 07/24/20,wife heard him fall,got up too quickly Was working on the ground, got  up quickly Concern for prerenal state   Lab Results  Component Value Date   CHOL 119 09/03/2022   HDL 44.60 09/03/2022   LDLCALC 50 09/03/2022   TRIG 123.0 09/03/2022    Past medical history reviewed severe series of tachycardia episodes day after Thanksgiving 2016 Reported he was driving in a car back from a trip, tachycardia seem to happen every 5 minutes. He took several diltiazem, possibly up to 3 or 4 and eventually symptoms seem to improve.  Holter monitor showing frequent episodes of SVT,  short-lived total cholesterol less than 150   Past Medical History:  Diagnosis Date   Arthritis    ? Gout   Bilateral carpal tunnel syndrome    managed with nocturnal braces x 3 years   COPD (chronic obstructive pulmonary disease) (HCC)    Dysrhythmia    Tachycardia, PVCs, SVTs   GERD (gastroesophageal reflux disease)    Glucose intolerance (impaired glucose tolerance)    Heart murmur    as a child   History of syncope 2005   Unexplained, myoview negative   Hyperlipidemia    Hypertension    Iron deficiency anemia    Osteoarthritis of multiple joints    Palpitations 2010   Life Watch with single PVC   Pre-diabetes    Past Surgical History:  Procedure Laterality Date   APPENDECTOMY     1957   COLONOSCOPY     COLONOSCOPY WITH PROPOFOL N/A 01/24/2020   Procedure: COLONOSCOPY WITH PROPOFOL;  Surgeon: Toney Reil, MD;  Location: [facility] ENDOSCOPY;  Service: Gastroenterology;  Laterality: N/A;   JOINT REPLACEMENT     TONSILLECTOMY     1952   TOTAL KNEE ARTHROPLASTY Right 03/14/2019   Procedure: RIGHT TOTAL KNEE ARTHROPLASTY;  Surgeon: Cammy Copa, MD;  Location: [facility] OR;  Service: Orthopedics;  Laterality: Right;   TOTAL KNEE ARTHROPLASTY Left 06/22/2019   Procedure: LEFT TOTAL KNEE ARTHROPLASTY;  Surgeon: Cammy Copa, MD;  Location: [facility] OR;  Service: Orthopedics;  Laterality: Left;     Current Meds  Medication Sig   aspirin EC 81 MG tablet Take 81 mg by mouth daily. Swallow whole.   celecoxib (CELEBREX) 100 MG capsule Take 100 mg by mouth every other day.   Cyanocobalamin (VITAMIN B 12) 100 MCG LOZG Take by mouth daily.   diltiazem (CARDIZEM CD) 120 MG 24 hr capsule Take 1 capsule (120 mg total) by mouth in the morning and at bedtime.   dorzolamide-timolol (COSOPT) 2-0.5 % ophthalmic solution Place 1 drop into both eyes 2 (two) times daily.   hydrALAZINE (APRESOLINE) 25 MG tablet Take 1 tablet (25 mg total) by mouth 3 (three) times daily as needed (for  pressure >150).   hydrochlorothiazide (HYDRODIURIL) 25 MG tablet Take 1 tablet (25 mg total) by mouth daily.   lidocaine (LIDODERM) 5 % Place 1 patch onto the skin daily. Remove & Discard patch within 12 hours or as directed by MD   metoprolol succinate (TOPROL-XL) 50 MG 24 hr tablet Take 1 tablet (50 mg total) by mouth daily.   predniSONE (DELTASONE) 5 MG tablet Take as directed - 6 day taper; take as needed pain flareup   rosuvastatin (CRESTOR) 10 MG tablet Take 10 mg by mouth daily.   sildenafil (VIAGRA) 50 MG tablet Take 1 tablet (50 mg total) by mouth daily as needed for erectile dysfunction.   telmisartan (MICARDIS) 80 MG tablet Take 1 tablet (80 mg total) by mouth daily.   timolol (TIMOPTIC) 0.5 %  ophthalmic solution 1 drop 2 (two) times daily.   traMADol (ULTRAM) 50 MG tablet Take 2 tablets (100 mg total) by mouth every 6 (six) hours as needed.   valACYclovir (VALTREX) 1000 MG tablet Take 1-2 g by mouth daily as needed (cold sores/fever blisters).      Allergies:   Bee venom and Other   Social History   Tobacco Use   Smoking status: Never   Smokeless tobacco: Never  Vaping Use   Vaping status: Never Used  Substance Use Topics   Alcohol use: Yes    Comment: 1 glass of wine or a beer 2 to 3 days a week   Drug use: No     Family Hx: The patient's family history includes Colon cancer in his brother and paternal grandmother; Coronary artery disease in his mother; Heart attack in his mother; Heart failure in his father.  ROS:   Please see the history of present illness.    Review of Systems  HENT: Negative.    Respiratory: Negative.    Cardiovascular: Negative.   Gastrointestinal: Negative.   Musculoskeletal:  Positive for back pain.  Neurological: Negative.   Psychiatric/Behavioral: Negative.    All other systems reviewed and are negative.    Labs/Other Tests and Data Reviewed:    Recent Labs: 11/13/2022: ALT 43; BUN 12; Creatinine, Ser 0.84; Potassium 3.9; Sodium 133    Recent Lipid Panel Lab Results  Component Value Date/Time   CHOL 119 09/03/2022 08:39 AM   TRIG 123.0 09/03/2022 08:39 AM   TRIG 108 05/01/2009 12:00 AM   HDL 44.60 09/03/2022 08:39 AM   CHOLHDL 3 09/03/2022 08:39 AM   LDLCALC 50 09/03/2022 08:39 AM   LDLDIRECT 72.0 04/17/2022 10:15 AM    Wt Readings from Last 3 Encounters:  02/26/23 199 lb 2 oz (90.3 kg)  02/15/23 198 lb 2 oz (89.9 kg)  11/27/22 197 lb 3.2 oz (89.4 kg)     Exam:    BP (!) 140/84 (BP Location: Left Arm, Patient Position: Sitting, Cuff Size: Normal)   Pulse (!) 57   Ht 5' 6.5" (1.689 m)   Wt 199 lb 2 oz (90.3 kg)   SpO2 96%   BMI 31.66 kg/m  Constitutional:  oriented to person, place, and time. No distress.  HENT:  Head: Grossly normal Eyes:  no discharge. No scleral icterus.  Neck: No JVD, no carotid bruits  Cardiovascular: Regular rate and rhythm, no murmurs appreciated Pulmonary/Chest: Clear to auscultation bilaterally, no wheezes or rails Abdominal: Soft.  no distension.  no tenderness.  Musculoskeletal: Normal range of motion Neurological:  normal muscle tone. Coordination normal. No atrophy Skin: Skin warm and dry Psychiatric: normal affect, pleasant   ASSESSMENT & PLAN:    SVT (supraventricular tachycardia) (HCC) Frequent episodes of SVT on monitor, PACs Improved symptoms with diltiazem ER 120 twice daily Also on metoprolol succinate 50 daily Recommend he call for breakthrough tachypalpitations, short acting diltiazem or metoprolol could be used  HYPERTENSION, BENIGN Blood pressure is well controlled on today's visit. No changes made to the medications.  PVC's (premature ventricular contractions) Noted on monitor with frequent SVT episodes Denies significant symptoms, continue current medications  Mixed hyperlipidemia Cholesterol is at goal on the current lipid regimen. No changes to the medications were made.  CAD with stable angina Coronary calcification score 638 on scan in  2020 Currently with no symptoms of angina. No further workup at this time. Continue current medication regimen.  History of syncope/near syncope Prior episode  of syncope early 2022, likely orthostasis Hydration on days with low blood pressure May need to hold HCTZ on long days working in the garden in the heat   Total encounter time more than 30 minutes  Greater than 50% was spent in counseling and coordination of care with the patient   Signed, Julien Nordmann, MD  02/26/2023 8:21 AM    [facility] [address], [place], [place] [address]

## 2023-02-26 NOTE — Patient Instructions (Signed)

## 2023-03-01 NOTE — Telephone Encounter (Signed)
The result is in epic now

## 2023-03-03 ENCOUNTER — Telehealth: Payer: Self-pay

## 2023-03-03 NOTE — Telephone Encounter (Signed)
Called primary number and it was back and it was busy called mobile and left a message for call back

## 2023-03-03 NOTE — Telephone Encounter (Signed)
-----   Message from [facility] sent at 03/02/2023 11:48 PM EDT ----- Please inform patient that the ultrasound of liver showed probable cirrhosis along with severe fatty liver.  I will see him for upper endoscopy as scheduled.  RV

## 2023-03-03 NOTE — Telephone Encounter (Signed)
Tried to call patient but line was busy  °

## 2023-03-04 NOTE — Telephone Encounter (Addendum)
Called primary number and it was back and it was busy called mobile number and he verbalized understanding of results

## 2023-03-31 ENCOUNTER — Encounter: Payer: Self-pay | Admitting: Gastroenterology

## 2023-04-01 ENCOUNTER — Ambulatory Visit: Payer: Medicare PPO | Admitting: Anesthesiology

## 2023-04-01 ENCOUNTER — Encounter: Payer: Self-pay | Admitting: Gastroenterology

## 2023-04-01 ENCOUNTER — Ambulatory Visit
Admission: RE | Admit: 2023-04-01 | Discharge: 2023-04-01 | Disposition: A | Discharge status: Home / Self Care | Payer: Medicare PPO | Attending: Gastroenterology | Admitting: Gastroenterology

## 2023-04-01 ENCOUNTER — Encounter
Admission: RE | Disposition: A | Discharge status: Home / Self Care | Payer: Self-pay | Source: Home / Self Care | Attending: Gastroenterology

## 2023-04-01 DIAGNOSIS — I10 Essential (primary) hypertension: Secondary | ICD-10-CM | POA: Diagnosis not present

## 2023-04-01 DIAGNOSIS — J449 Chronic obstructive pulmonary disease, unspecified: Secondary | ICD-10-CM | POA: Diagnosis not present

## 2023-04-01 DIAGNOSIS — Z13810 Encounter for screening for upper gastrointestinal disorder: Secondary | ICD-10-CM | POA: Diagnosis not present

## 2023-04-01 DIAGNOSIS — K219 Gastro-esophageal reflux disease without esophagitis: Secondary | ICD-10-CM | POA: Insufficient documentation

## 2023-04-01 DIAGNOSIS — E785 Hyperlipidemia, unspecified: Secondary | ICD-10-CM | POA: Diagnosis not present

## 2023-04-01 DIAGNOSIS — I8500 Esophageal varices without bleeding: Secondary | ICD-10-CM | POA: Diagnosis not present

## 2023-04-01 DIAGNOSIS — Q399 Congenital malformation of esophagus, unspecified: Secondary | ICD-10-CM

## 2023-04-01 DIAGNOSIS — M1990 Unspecified osteoarthritis, unspecified site: Secondary | ICD-10-CM | POA: Diagnosis not present

## 2023-04-01 DIAGNOSIS — K7460 Unspecified cirrhosis of liver: Secondary | ICD-10-CM | POA: Diagnosis not present

## 2023-04-01 DIAGNOSIS — Z79899 Other long term (current) drug therapy: Secondary | ICD-10-CM | POA: Insufficient documentation

## 2023-04-01 DIAGNOSIS — K7581 Nonalcoholic steatohepatitis (NASH): Secondary | ICD-10-CM

## 2023-04-01 DIAGNOSIS — Z96653 Presence of artificial knee joint, bilateral: Secondary | ICD-10-CM | POA: Diagnosis not present

## 2023-04-01 DIAGNOSIS — I471 Supraventricular tachycardia, unspecified: Secondary | ICD-10-CM | POA: Diagnosis not present

## 2023-04-01 DIAGNOSIS — Z6830 Body mass index (BMI) 30.0-30.9, adult: Secondary | ICD-10-CM | POA: Insufficient documentation

## 2023-04-01 HISTORY — PX: ESOPHAGOGASTRODUODENOSCOPY (EGD) WITH PROPOFOL: SHX5813

## 2023-04-01 SURGERY — ESOPHAGOGASTRODUODENOSCOPY (EGD) WITH PROPOFOL
Anesthesia: General

## 2023-04-01 MED ORDER — LIDOCAINE HCL (CARDIAC) PF 100 MG/5ML IV SOSY
PREFILLED_SYRINGE | INTRAVENOUS | Status: DC | PRN
  Administered 2023-04-01: 40 mg via INTRAVENOUS

## 2023-04-01 MED ORDER — PROPOFOL 1000 MG/100ML IV EMUL
INTRAVENOUS | Status: AC
  Filled 2023-04-01: qty 100

## 2023-04-01 MED ORDER — SODIUM CHLORIDE 0.9 % IV SOLN: INTRAVENOUS | Status: DC

## 2023-04-01 MED ORDER — PROPOFOL 500 MG/50ML IV EMUL
INTRAVENOUS | Status: DC | PRN
  Administered 2023-04-01: 175 ug/kg/min via INTRAVENOUS

## 2023-04-01 MED ORDER — PROPOFOL 10 MG/ML IV BOLUS
INTRAVENOUS | Status: DC | PRN
  Administered 2023-04-01: 80 mg via INTRAVENOUS

## 2023-04-01 NOTE — Anesthesia Preprocedure Evaluation (Signed)
Anesthesia Evaluation  Patient identified by MRN, date of birth, ID band Patient awake    Reviewed: Allergy & Precautions, NPO status , Patient's Chart, lab work & pertinent test results  Airway Mallampati: II  TM Distance: >3 FB Neck ROM: Full    Dental  (+) Teeth Intact   Pulmonary neg pulmonary ROS   Pulmonary exam normal  + decreased breath sounds      Cardiovascular Exercise Tolerance: Good hypertension, Pt. on medications negative cardio ROS Normal cardiovascular exam Rhythm:Regular Rate:Normal     Neuro/Psych negative neurological ROS  negative psych ROS   GI/Hepatic negative GI ROS, Neg liver ROS,GERD  Medicated,,  Endo/Other  negative endocrine ROS  Morbid obesity  Renal/GU negative Renal ROS  negative genitourinary   Musculoskeletal  (+) Arthritis ,    Abdominal  (+) + obese  Peds negative pediatric ROS (+)  Hematology negative hematology ROS (+) Blood dyscrasia, anemia   Anesthesia Other Findings Past Medical History: No date: Arthritis     Comment:  ? Gout No date: Bilateral carpal tunnel syndrome     Comment:  managed with nocturnal braces x 3 years No date: COPD (chronic obstructive pulmonary disease) (HCC) No date: Dysrhythmia     Comment:  Tachycardia, PVCs, SVTs No date: GERD (gastroesophageal reflux disease) No date: Glucose intolerance (impaired glucose tolerance) No date: Heart murmur     Comment:  as a child 2005: History of syncope     Comment:  Unexplained, myoview negative No date: Hyperlipidemia No date: Hypertension No date: Iron deficiency anemia No date: Osteoarthritis of multiple joints 2010: Palpitations     Comment:  Life Watch with single PVC No date: Pre-diabetes  Past Surgical History: No date: APPENDECTOMY     Comment:  1957 No date: COLONOSCOPY 01/24/2020: COLONOSCOPY WITH PROPOFOL; N/A     Comment:  Procedure: COLONOSCOPY WITH PROPOFOL;  Surgeon: Toney Reil, MD;  Location: [facility] ENDOSCOPY;  Service:               Gastroenterology;  Laterality: N/A; No date: JOINT REPLACEMENT No date: TONSILLECTOMY     Comment:  1952 03/14/2019: TOTAL KNEE ARTHROPLASTY; Right     Comment:  Procedure: RIGHT TOTAL KNEE ARTHROPLASTY;  Surgeon:               Cammy Copa, MD;  Location: [facility] OR;  Service:               Orthopedics;  Laterality: Right; 06/22/2019: TOTAL KNEE ARTHROPLASTY; Left     Comment:  Procedure: LEFT TOTAL KNEE ARTHROPLASTY;  Surgeon: Cammy Copa, MD;  Location: [facility] OR;  Service:               Orthopedics;  Laterality: Left;  BMI    Body Mass Index: 30.27 kg/m      Reproductive/Obstetrics negative OB ROS                             Anesthesia Physical Anesthesia Plan  ASA: 3  Anesthesia Plan: General   Post-op Pain Management:    Induction: Intravenous  PONV Risk Score and Plan: Propofol infusion and TIVA  Airway Management Planned: Natural Airway and Nasal Cannula  Additional Equipment:   Intra-op Plan:   Post-operative Plan:   Informed  Consent: I have reviewed the patients History and Physical, chart, labs and discussed the procedure including the risks, benefits and alternatives for the proposed anesthesia with the patient or authorized representative who has indicated his/her understanding and acceptance.     Dental Advisory Given  Plan Discussed with: CRNA and Surgeon  Anesthesia Plan Comments:        Anesthesia Quick Evaluation

## 2023-04-01 NOTE — H&P (Signed)
Mark Repress, MD [address]  [address]  [place], [place] [address]  Main: [phone]  Fax: [phone] Pager: [phone]  Primary Care Physician:  Sherlene Shams, MD Primary Gastroenterologist:  Dr. Arlyss Farley  Pre-Procedure History & Physical: HPI:  Mark Farley. is a 77 y.o. male is here for an endoscopy.   Past Medical History:  Diagnosis Date   Arthritis    ? Gout   Bilateral carpal tunnel syndrome    managed with nocturnal braces x 3 years   COPD (chronic obstructive pulmonary disease) (HCC)    Dysrhythmia    Tachycardia, PVCs, SVTs   GERD (gastroesophageal reflux disease)    Glucose intolerance (impaired glucose tolerance)    Heart murmur    as a child   History of syncope 2005   Unexplained, myoview negative   Hyperlipidemia    Hypertension    Iron deficiency anemia    Osteoarthritis of multiple joints    Palpitations 2010   Life Watch with single PVC   Pre-diabetes     Past Surgical History:  Procedure Laterality Date   APPENDECTOMY     1957   COLONOSCOPY     COLONOSCOPY WITH PROPOFOL N/A 01/24/2020   Procedure: COLONOSCOPY WITH PROPOFOL;  Surgeon: Toney Reil, MD;  Location: [facility] ENDOSCOPY;  Service: Gastroenterology;  Laterality: N/A;   JOINT REPLACEMENT     TONSILLECTOMY     1952   TOTAL KNEE ARTHROPLASTY Right 03/14/2019   Procedure: RIGHT TOTAL KNEE ARTHROPLASTY;  Surgeon: Cammy Copa, MD;  Location: [facility] OR;  Service: Orthopedics;  Laterality: Right;   TOTAL KNEE ARTHROPLASTY Left 06/22/2019   Procedure: LEFT TOTAL KNEE ARTHROPLASTY;  Surgeon: Cammy Copa, MD;  Location: [facility] OR;  Service: Orthopedics;  Laterality: Left;    Prior to Admission medications   Medication Sig Start Date End Date Taking? Authorizing Provider  aspirin EC 81 MG tablet Take 81 mg by mouth daily. Swallow whole.   Yes [provider]  Cyanocobalamin (VITAMIN B 12) 100 MCG LOZG Take by mouth daily.   Yes [provider]  diltiazem (CARDIZEM CD) 120 MG 24 hr capsule Take 1 capsule (120 mg total) by mouth in the morning and at bedtime. 09/30/22 04/01/23 Yes Lanier Prude, MD  dorzolamide-timolol (COSOPT) 2-0.5 % ophthalmic solution Place 1 drop into both eyes 2 (two) times daily. 07/30/22  Yes [provider]  hydrochlorothiazide (HYDRODIURIL) 25 MG tablet Take 1 tablet (25 mg total) by mouth daily. 02/26/23  Yes Antonieta Iba, MD  metoprolol succinate (TOPROL-XL) 50 MG 24 hr tablet Take 1 tablet (50 mg total) by mouth daily. 02/02/17  Yes Gollan, Tollie Pizza, MD  rosuvastatin (CRESTOR) 10 MG tablet Take 10 mg by mouth daily.   Yes [provider]  telmisartan (MICARDIS) 80 MG tablet Take 1 tablet (80 mg total) by mouth daily. 02/26/23  Yes Gollan, Tollie Pizza, MD  amoxicillin (AMOXIL) 500 MG tablet Take 4 tablets by mouth (2,000 mg) 30-60 minutes prior to dental procedure. Patient not taking: Reported on 02/26/2023 06/08/19   Magnant, Joycie Peek, PA-C  celecoxib (CELEBREX) 100 MG capsule Take 100 mg by mouth every other day. 06/03/22   [provider]  EPINEPHrine (EPIPEN 2-PAK) 0.3 mg/0.3 mL IJ SOAJ injection Inject 0.3 mg into the muscle as needed for anaphylaxis. Patient not taking: Reported on 02/26/2023    [provider]  hydrALAZINE (APRESOLINE) 25 MG tablet Take 1 tablet (25 mg  total) by mouth 3 (three) times daily as needed (for pressure >150). 05/14/22   Antonieta Iba, MD  lidocaine (LIDODERM) 5 % Place 1 patch onto the skin daily. Remove & Discard patch within 12 hours or as directed by MD 07/30/22   Sherlene Shams, MD  omeprazole (PRILOSEC) 20 MG capsule Take 1 capsule (20 mg total) by mouth daily. Patient not taking: Reported on 02/26/2023 11/27/22   Sherlene Shams, MD  predniSONE (DELTASONE) 5 MG tablet Take as directed - 6 day taper; take as needed pain flareup 02/09/23   [provider]  sildenafil (VIAGRA) 50 MG tablet Take 1 tablet (50 mg total)  by mouth daily as needed for erectile dysfunction. 05/14/22   Antonieta Iba, MD  timolol (TIMOPTIC) 0.5 % ophthalmic solution 1 drop 2 (two) times daily. 12/04/19   [provider]  traMADol (ULTRAM) 50 MG tablet Take 2 tablets (100 mg total) by mouth every 6 (six) hours as needed. 06/09/22   Sherlene Shams, MD  valACYclovir (VALTREX) 1000 MG tablet Take 1-2 g by mouth daily as needed (cold sores/fever blisters).  02/07/19   [provider]    Allergies as of 02/15/2023 - Review Complete 02/15/2023  Allergen Reaction Noted   Bee venom Swelling and Rash 04/02/2012   Other Rash 04/02/2012    Family History  Problem Relation Age of Onset   Coronary artery disease Mother    Heart attack Mother        MI   Heart failure Father        CHF   Colon cancer Brother    Colon cancer Paternal Grandmother     Social History   Socioeconomic History   Marital status: Married    Spouse name: Not on file   Number of children: Not on file   Years of education: Not on file   Highest education level: Not on file  Occupational History   Not on file  Tobacco Use   Smoking status: Never   Smokeless tobacco: Never  Vaping Use   Vaping status: Never Used  Substance and Sexual Activity   Alcohol use: Yes    Comment: 1 glass of wine or a beer 2 to 3 days a week   Drug use: No   Sexual activity: Yes  Other Topics Concern   Not on file  Social History Narrative   Married Very active   Social Determinants of Health   Financial Resource Strain: Low Risk  (03/10/2018)   Overall Financial Resource Strain (CARDIA)    Difficulty of Paying Living Expenses: Not hard at all  Food Insecurity: No Food Insecurity (05/20/2022)   Hunger Vital Sign    Worried About Running Out of Food in the Last Year: Never true    Ran Out of Food in the Last Year: Never true  Transportation Needs: No Transportation Needs (05/20/2022)   PRAPARE - Administrator, Civil Service (Medical): No     Lack of Transportation (Non-Medical): No  Physical Activity: Sufficiently Active (03/10/2018)   Exercise Vital Sign    Days of Exercise per Week: 5 days    Minutes of Exercise per Session: 120 min  Stress: No Stress Concern Present (03/10/2018)   [employer] of Occupational Health - Occupational Stress Questionnaire    Feeling of Stress : Only a little  Social Connections: Unknown (03/10/2018)   Social Connection and Isolation Panel [NHANES]    Frequency of Communication with Friends and  Family: Not on file    Frequency of Social Gatherings with Friends and Family: Not on file    Attends Religious Services: Not on file    Active Member of Clubs or Organizations: Not on file    Attends Club or Organization Meetings: Not on file    Marital Status: Married  Intimate Partner Violence: Not At Risk (03/10/2018)   Humiliation, Afraid, Rape, and Kick questionnaire    Fear of Current or Ex-Partner: No    Emotionally Abused: No    Physically Abused: No    Sexually Abused: No    Review of Systems: See HPI, otherwise negative ROS  Physical Exam: BP (!) 160/93   Pulse (!) 53   Temp (!) 97.1 F (36.2 C) (Temporal)   Resp 17   Ht 5' 6.5" (1.689 m)   Wt 86.4 kg   SpO2 99%   BMI 30.27 kg/m  General:   Alert,  pleasant and cooperative in NAD Head:  Normocephalic and atraumatic. Neck:  Supple; no masses or thyromegaly. Lungs:  Clear throughout to auscultation.    Heart:  Regular rate and rhythm. Abdomen:  Soft, nontender and nondistended. Normal bowel sounds, without guarding, and without rebound.   Neurologic:  Alert and  oriented x4;  grossly normal neurologically.  Impression/Plan: Benay Pillow. is here for an endoscopy to be performed for variceal screening, cirrhosis of liver  Risks, benefits, limitations, and alternatives regarding  endoscopy have been reviewed with the patient.  Questions have been answered.  All parties agreeable.   Lannette Donath, MD   04/01/2023, 9:40 AM

## 2023-04-01 NOTE — Op Note (Signed)
[facility] Patient Name: Mark Farley Procedure Date: 04/01/2023 9:41 AM MRN: [id number] Account #: [id number] Date of Birth: 19-Nov-1945 Admit Type: Outpatient Age: 77 Room: [facility] ENDO ROOM 3 Gender: Male Note Status: Finalized Instrument Name: Upper Endoscope [id number] Procedure:             Upper GI endoscopy Indications:           Screening procedure, Cirrhosis rule out esophageal                         varices Providers:             Toney Reil MD, MD Referring MD:          Duncan Dull, MD (Referring MD) Medicines:             General Anesthesia Complications:         No immediate complications. Estimated blood loss: None. Procedure:             Pre-Anesthesia Assessment:                        - Prior to the procedure, a History and Physical was                         performed, and patient medications and allergies were                         reviewed. The patient is competent. The risks and                         benefits of the procedure and the sedation options and                         risks were discussed with the patient. All questions                         were answered and informed consent was obtained.                         Patient identification and proposed procedure were                         verified by the physician, the nurse, the                         anesthesiologist, the anesthetist and the technician                         in the pre-procedure area in the procedure room in the                         endoscopy suite. Mental Status Examination: alert and                         oriented. Airway Examination: normal oropharyngeal                         airway and neck mobility. Respiratory Examination:  clear to auscultation. CV Examination: normal.                         Prophylactic Antibiotics: The patient does not require                         prophylactic antibiotics.  Prior Anticoagulants: The                         patient has taken no anticoagulant or antiplatelet                         agents. ASA Grade Assessment: III - A patient with                         severe systemic disease. After reviewing the risks and                         benefits, the patient was deemed in satisfactory                         condition to undergo the procedure. The anesthesia                         plan was to use general anesthesia. Immediately prior                         to administration of medications, the patient was                         re-assessed for adequacy to receive sedatives. The                         heart rate, respiratory rate, oxygen saturations,                         blood pressure, adequacy of pulmonary ventilation, and                         response to care were monitored throughout the                         procedure. The physical status of the patient was                         re-assessed after the procedure.                        After obtaining informed consent, the endoscope was                         passed under direct vision. Throughout the procedure,                         the patient's blood pressure, pulse, and oxygen                         saturations were monitored continuously. The Endoscope  was introduced through the mouth, and advanced to the                         second part of duodenum. The upper GI endoscopy was                         accomplished without difficulty. The patient tolerated                         the procedure well. Findings:      The duodenal bulb and second portion of the duodenum were normal.      The entire examined stomach was normal.      The cardia and gastric fundus were normal on retroflexion.      The gastroesophageal junction was normal.      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 Impression:             - Normal duodenal bulb and second portion of the                         duodenum.                        - Normal stomach.                        - Ectopic gastric mucosa in the upper third of the                         esophagus.                        - No specimens collected. Recommendation:        - Discharge patient to home (with escort).                        - Low sodium diet indefinitely.                        - Continue present medications.                        - Return to my office as previously scheduled. Procedure Code(s):     --- Professional ---                        [phone], Esophagogastroduodenoscopy, flexible,                         transoral; diagnostic, including collection of                         specimen(s) by brushing or washing, when performed                         (separate procedure) Diagnosis Code(s):     --- Professional ---                        O75.643, Encounter for screening for upper  gastrointestinal disorder                        K74.60, Unspecified cirrhosis of liver CPT copyright 2022 American Medical Association. All rights reserved. The codes documented in this report are preliminary and upon coder review may  be revised to meet current compliance requirements. Dr. Libby Maw Toney Reil MD, MD 04/01/2023 10:04:07 AM This report has been signed electronically. Number of Addenda: 0 Note Initiated On: 04/01/2023 9:41 AM Estimated Blood Loss:  Estimated blood loss: none.      [facility]

## 2023-04-01 NOTE — Anesthesia Postprocedure Evaluation (Signed)
Anesthesia Post Note  Patient: Mark Farley.  Procedure(s) Performed: ESOPHAGOGASTRODUODENOSCOPY (EGD) WITH PROPOFOL  Patient location during evaluation: PACU Anesthesia Type: General Level of consciousness: awake and awake and alert Pain management: satisfactory to patient Vital Signs Assessment: post-procedure vital signs reviewed and stable Respiratory status: nonlabored ventilation Cardiovascular status: blood pressure returned to baseline Anesthetic complications: no   No notable events documented.   Last Vitals:  Vitals:   04/01/23 0811 04/01/23 1004  BP: (!) 160/93 101/72  Pulse: (!) 53 (!) 46  Resp: 17 16  Temp: (!) 36.2 C   SpO2: 99% 96%    Last Pain:  Vitals:   04/01/23 1004  TempSrc:   PainSc: Asleep                 VAN STAVEREN,Marizol Borror

## 2023-04-01 NOTE — Anesthesia Procedure Notes (Signed)
Date/Time: 04/01/2023 9:54 AM  Performed by: Stormy Fabian, CRNAPre-anesthesia Checklist: Patient identified, Emergency Drugs available, Suction available and Patient being monitored Patient Re-evaluated:Patient Re-evaluated prior to induction Oxygen Delivery Method: Nasal cannula Induction Type: IV induction Dental Injury: Teeth and Oropharynx as per pre-operative assessment  Comments: Nasal cannula with etCO2 monitoring

## 2023-04-01 NOTE — Transfer of Care (Signed)
Immediate Anesthesia Transfer of Care Note  Patient: Mark Farley.  Procedure(s) Performed: Procedure(s): ESOPHAGOGASTRODUODENOSCOPY (EGD) WITH PROPOFOL (N/A)  Patient Location: PACU and Endoscopy Unit  Anesthesia Type:General  Level of Consciousness: sedated  Airway & Oxygen Therapy: Patient Spontanous Breathing and Patient connected to nasal cannula oxygen  Post-op Assessment: Report given to RN and Post -op Vital signs reviewed and stable  Post vital signs: Reviewed and stable  Last Vitals:  Vitals:   04/01/23 0811 04/01/23 1004  BP: (!) 160/93 101/72  Pulse: (!) 53 (!) 46  Resp: 17 16  Temp: (!) 36.2 C   SpO2: 99% 96%    Complications: No apparent anesthesia complications

## 2023-04-09 ENCOUNTER — Other Ambulatory Visit
Admission: RE | Admit: 2023-04-09 | Discharge: 2023-04-09 | Disposition: A | Discharge status: Home / Self Care | Payer: Medicare PPO | Source: Ambulatory Visit | Attending: Medical Genetics | Admitting: Medical Genetics

## 2023-04-09 DIAGNOSIS — Z006 Encounter for examination for normal comparison and control in clinical research program: Secondary | ICD-10-CM | POA: Insufficient documentation

## 2023-04-20 LAB — GENECONNECT MOLECULAR SCREEN

## 2023-04-20 LAB — HELIX MOLECULAR SCREEN: Genetic Analysis Overall Interpretation: NEGATIVE

## 2023-04-22 DIAGNOSIS — D2261 Melanocytic nevi of right upper limb, including shoulder: Secondary | ICD-10-CM | POA: Diagnosis not present

## 2023-04-22 DIAGNOSIS — D2272 Melanocytic nevi of left lower limb, including hip: Secondary | ICD-10-CM | POA: Diagnosis not present

## 2023-04-22 DIAGNOSIS — D225 Melanocytic nevi of trunk: Secondary | ICD-10-CM | POA: Diagnosis not present

## 2023-04-22 DIAGNOSIS — B078 Other viral warts: Secondary | ICD-10-CM | POA: Diagnosis not present

## 2023-04-22 DIAGNOSIS — L814 Other melanin hyperpigmentation: Secondary | ICD-10-CM | POA: Diagnosis not present

## 2023-04-22 DIAGNOSIS — L821 Other seborrheic keratosis: Secondary | ICD-10-CM | POA: Diagnosis not present

## 2023-04-22 DIAGNOSIS — L538 Other specified erythematous conditions: Secondary | ICD-10-CM | POA: Diagnosis not present

## 2023-04-22 DIAGNOSIS — D2262 Melanocytic nevi of left upper limb, including shoulder: Secondary | ICD-10-CM | POA: Diagnosis not present

## 2023-04-22 DIAGNOSIS — D2271 Melanocytic nevi of right lower limb, including hip: Secondary | ICD-10-CM | POA: Diagnosis not present

## 2023-05-10 ENCOUNTER — Telehealth: Payer: Self-pay

## 2023-05-10 DIAGNOSIS — E663 Overweight: Secondary | ICD-10-CM

## 2023-05-10 DIAGNOSIS — E782 Mixed hyperlipidemia: Secondary | ICD-10-CM

## 2023-05-10 DIAGNOSIS — I10 Essential (primary) hypertension: Secondary | ICD-10-CM

## 2023-05-10 DIAGNOSIS — R7303 Prediabetes: Secondary | ICD-10-CM

## 2023-05-10 NOTE — Telephone Encounter (Signed)
Patient states he has an appointment with Dr. Duncan Dull on 05/31/2023, and would like to come in for labs before his visit.  I spoke with Sandy Salaam, CMA, and scheduled patient for a lab visit on 05/25/2023.  Shanda Bumps states she will get the lab orders in for patient.

## 2023-05-11 NOTE — Telephone Encounter (Signed)
I have pended labs for your approval. Is there anything else that is needed.

## 2023-05-11 NOTE — Addendum Note (Signed)
Addended by: Sandy Salaam on: 05/11/2023 01:17 PM   Modules accepted: Orders

## 2023-05-11 NOTE — Addendum Note (Signed)
Addended by: Sherlene Shams on: 05/11/2023 01:27 PM   Modules accepted: Orders

## 2023-05-25 ENCOUNTER — Other Ambulatory Visit: Payer: Medicare PPO

## 2023-05-25 DIAGNOSIS — E782 Mixed hyperlipidemia: Secondary | ICD-10-CM | POA: Diagnosis not present

## 2023-05-25 DIAGNOSIS — R7303 Prediabetes: Secondary | ICD-10-CM

## 2023-05-25 DIAGNOSIS — E663 Overweight: Secondary | ICD-10-CM

## 2023-05-25 DIAGNOSIS — I10 Essential (primary) hypertension: Secondary | ICD-10-CM

## 2023-05-25 LAB — CBC WITH DIFFERENTIAL/PLATELET
Basophils Absolute: 0 10*3/uL (ref 0.0–0.1)
Basophils Relative: 0.4 % (ref 0.0–3.0)
Eosinophils Absolute: 0.1 10*3/uL (ref 0.0–0.7)
Eosinophils Relative: 2.3 % (ref 0.0–5.0)
HCT: 44.9 % (ref 39.0–52.0)
Hemoglobin: 15.3 g/dL (ref 13.0–17.0)
Lymphocytes Relative: 37.9 % (ref 12.0–46.0)
Lymphs Abs: 2 10*3/uL (ref 0.7–4.0)
MCHC: 34.1 g/dL (ref 30.0–36.0)
MCV: 92.4 fL (ref 78.0–100.0)
Monocytes Absolute: 0.5 10*3/uL (ref 0.1–1.0)
Monocytes Relative: 10.2 % (ref 3.0–12.0)
Neutro Abs: 2.6 10*3/uL (ref 1.4–7.7)
Neutrophils Relative %: 49.2 % (ref 43.0–77.0)
Platelets: 190 10*3/uL (ref 150.0–400.0)
RBC: 4.86 Mil/uL (ref 4.22–5.81)
RDW: 12.7 % (ref 11.5–15.5)
WBC: 5.3 10*3/uL (ref 4.0–10.5)

## 2023-05-25 LAB — LDL CHOLESTEROL, DIRECT: Direct LDL: 69 mg/dL

## 2023-05-25 LAB — COMPREHENSIVE METABOLIC PANEL
ALT: 31 U/L (ref 0–53)
AST: 25 U/L (ref 0–37)
Albumin: 4.4 g/dL (ref 3.5–5.2)
Alkaline Phosphatase: 59 U/L (ref 39–117)
BUN: 13 mg/dL (ref 6–23)
CO2: 30 meq/L (ref 19–32)
Calcium: 9.4 mg/dL (ref 8.4–10.5)
Chloride: 94 meq/L — ABNORMAL LOW (ref 96–112)
Creatinine, Ser: 0.85 mg/dL (ref 0.40–1.50)
GFR: 83.69 mL/min (ref >60.00)
Glucose, Bld: 107 mg/dL — ABNORMAL HIGH (ref 70–99)
Potassium: 3.9 meq/L (ref 3.5–5.1)
Sodium: 133 meq/L — ABNORMAL LOW (ref 135–145)
Total Bilirubin: 1.1 mg/dL (ref 0.2–1.2)
Total Protein: 7.1 g/dL (ref 6.0–8.3)

## 2023-05-25 LAB — MICROALBUMIN / CREATININE URINE RATIO
Creatinine,U: 65.4 mg/dL
Microalb Creat Ratio: 1.1 mg/g (ref 0.0–30.0)
Microalb, Ur: <0.7 mg/dL (ref 0.0–1.9)

## 2023-05-25 LAB — HEMOGLOBIN A1C: Hgb A1c MFr Bld: 6.3 % (ref 4.6–6.5)

## 2023-05-25 LAB — LIPID PANEL
Cholesterol: 123 mg/dL (ref 0–200)
HDL: 48 mg/dL (ref >39.00)
LDL Cholesterol: 55 mg/dL (ref 0–99)
NonHDL: 75.16
Total CHOL/HDL Ratio: 3
Triglycerides: 103 mg/dL (ref 0.0–149.0)
VLDL: 20.6 mg/dL (ref 0.0–40.0)

## 2023-05-25 LAB — TSH: TSH: 0.7 u[IU]/mL (ref 0.35–5.50)

## 2023-05-31 ENCOUNTER — Encounter: Payer: Self-pay | Admitting: Internal Medicine

## 2023-05-31 ENCOUNTER — Telehealth: Payer: Self-pay | Admitting: Internal Medicine

## 2023-05-31 ENCOUNTER — Ambulatory Visit (INDEPENDENT_AMBULATORY_CARE_PROVIDER_SITE_OTHER): Payer: Medicare PPO | Admitting: Internal Medicine

## 2023-05-31 VITALS — BP 130/78 | HR 53 | Ht 66.5 in | Wt 184.4 lb

## 2023-05-31 DIAGNOSIS — R7303 Prediabetes: Secondary | ICD-10-CM | POA: Diagnosis not present

## 2023-05-31 DIAGNOSIS — K7469 Other cirrhosis of liver: Secondary | ICD-10-CM

## 2023-05-31 DIAGNOSIS — I10 Essential (primary) hypertension: Secondary | ICD-10-CM

## 2023-05-31 DIAGNOSIS — E782 Mixed hyperlipidemia: Secondary | ICD-10-CM

## 2023-05-31 DIAGNOSIS — Z800 Family history of malignant neoplasm of digestive organs: Secondary | ICD-10-CM | POA: Diagnosis not present

## 2023-05-31 DIAGNOSIS — M461 Sacroiliitis, not elsewhere classified: Secondary | ICD-10-CM | POA: Diagnosis not present

## 2023-05-31 DIAGNOSIS — K760 Fatty (change of) liver, not elsewhere classified: Secondary | ICD-10-CM | POA: Diagnosis not present

## 2023-05-31 DIAGNOSIS — K7460 Unspecified cirrhosis of liver: Secondary | ICD-10-CM

## 2023-05-31 MED ORDER — LEVOFLOXACIN 500 MG PO TABS: 500.0000 mg | ORAL_TABLET | Freq: Every day | ORAL | 0 refills | Status: AC

## 2023-05-31 MED ORDER — PREDNISONE 10 MG PO TABS: ORAL_TABLET | ORAL | 0 refills | Status: DC

## 2023-05-31 MED ORDER — AZITHROMYCIN 500 MG PO TABS: 500.0000 mg | ORAL_TABLET | Freq: Every day | ORAL | 0 refills | Status: DC

## 2023-05-31 NOTE — Telephone Encounter (Signed)
Pt wanting to have labs done prior to his office visit. Please advise.

## 2023-05-31 NOTE — Assessment & Plan Note (Signed)
LDL and triglycerides are at goal on  current Crestor dose. Marland Kitchen He has no side effects and liver enzymes are normal. No changes today   Lab Results  Component Value Date   CHOL 123 05/25/2023   HDL 48.00 05/25/2023   LDLCALC 55 05/25/2023   LDLDIRECT 69.0 05/25/2023   TRIG 103.0 05/25/2023   CHOLHDL 3 05/25/2023   Lab Results  Component Value Date   ALT 31 05/25/2023   AST 25 05/25/2023   ALKPHOS 59 05/25/2023   BILITOT 1.1 05/25/2023   .lastl

## 2023-05-31 NOTE — Patient Instructions (Addendum)
.  You might want to try using Relaxium for insomnia  (as seen on TV commercials) . It is available through [employer] and contains all natural supplements:  Melatonin 5 mg  Chamomile 25 mg Passionflower extract 75 mg GABA 100 mg Ashwaganda extract 125 mg Magnesium citrate, glycinate, oxide (100 mg)  L tryptophan 500 mg Valerest (proprietary  ingredient ; probably valeria root extract)    Let me know when you want referral to dr Hazle Quant for the hernia evaluation    I do recommend the RSV vaccine for you, .  It is now available at your pharmacy  and will protect you against the Respiratory Syncytial Virus   The medications to take on your cruise:  Prednisone:  use for back strain,  bronchitis,  sinusitis  Levaquin: use or UTI or  sinusitis Azithromyin:  use for diarrhea or COVID (for COVID take prednisone as well)

## 2023-05-31 NOTE — Telephone Encounter (Signed)
MyChart message:  Shanda Bumps, could you request the labs that are needed in my record so I can schedule it?  The lady at check-out said they weren't requested yet, thanks.  [name]   ps.  please let me know so I can make the appointment.

## 2023-05-31 NOTE — Progress Notes (Signed)
Subjective:  Patient ID: Mark Pillow., male    DOB: 01-20-1946  Age: 77 y.o. MRN: [id number]  CC: The primary encounter diagnosis was HYPERTENSION, BENIGN. Diagnoses of Mixed hyperlipidemia, Prediabetes, Hepatic steatosis, Family history of colon cancer, Liver cirrhosis secondary to NASH (HCC), and SI (sacroiliac) joint inflammation (HCC) were also pertinent to this visit.   HPI Mark Farley. presents for  Chief Complaint  Patient presents with   Medical Management of Chronic Issues    6 month follow up    1) HTN:  Hypertension: patient checks blood pressure twice weekly at home.  Readings have been for the most part <130/80 at rest . Patient is following a reduced salt diet most days and is taking medications as prescribed (telmisartan, hydrochlorothiazide, metoprolol an dprn hydralazine)  2) Fatty liver:  reviewed recent GI evaluation  noting cirrhotic changes to liver.    3) prediabetes//overweight :  he is restricting his carbohydrates  ad exercising regularly. He has lost 15 lbs since late September    Outpatient Medications Prior to Visit  Medication Sig Dispense Refill   aspirin EC 81 MG tablet Take 81 mg by mouth daily. Swallow whole.     celecoxib (CELEBREX) 100 MG capsule Take 100 mg by mouth every other day.     Cyanocobalamin (VITAMIN B 12) 100 MCG LOZG Take by mouth daily.     diltiazem (CARDIZEM CD) 120 MG 24 hr capsule Take 1 capsule (120 mg total) by mouth in the morning and at bedtime. 180 capsule 3   dorzolamide-timolol (COSOPT) 2-0.5 % ophthalmic solution Place 1 drop into both eyes 2 (two) times daily.     hydrALAZINE (APRESOLINE) 25 MG tablet Take 1 tablet (25 mg total) by mouth 3 (three) times daily as needed (for pressure >150). 30 tablet 3   hydrochlorothiazide (HYDRODIURIL) 25 MG tablet Take 1 tablet (25 mg total) by mouth daily. 90 tablet 3   lidocaine (LIDODERM) 5 % Place 1 patch onto the skin daily. Remove & Discard patch within 12 hours or  as directed by MD 90 patch 3   metoprolol succinate (TOPROL-XL) 50 MG 24 hr tablet Take 1 tablet (50 mg total) by mouth daily. 90 tablet 3   rosuvastatin (CRESTOR) 10 MG tablet Take 10 mg by mouth daily.     sildenafil (VIAGRA) 50 MG tablet Take 1 tablet (50 mg total) by mouth daily as needed for erectile dysfunction. 10 tablet 2   telmisartan (MICARDIS) 80 MG tablet Take 1 tablet (80 mg total) by mouth daily. 90 tablet 3   timolol (TIMOPTIC) 0.5 % ophthalmic solution 1 drop 2 (two) times daily.     traMADol (ULTRAM) 50 MG tablet Take 2 tablets (100 mg total) by mouth every 6 (six) hours as needed. 240 tablet 0   valACYclovir (VALTREX) 1000 MG tablet Take 1-2 g by mouth daily as needed (cold sores/fever blisters).      predniSONE (DELTASONE) 5 MG tablet Take as directed - 6 day taper; take as needed pain flareup     No facility-administered medications prior to visit.    Review of Systems;  Patient denies headache, fevers, malaise, unintentional weight loss, skin rash, eye pain, sinus congestion and sinus pain, sore throat, dysphagia,  hemoptysis , cough, dyspnea, wheezing, chest pain, palpitations, orthopnea, edema, abdominal pain, nausea, melena, diarrhea, constipation, flank pain, dysuria, hematuria, urinary  Frequency, nocturia, numbness, tingling, seizures,  Focal weakness, Loss of consciousness,  Tremor, insomnia, depression, anxiety, and suicidal ideation.  Objective:  BP 130/78   Pulse (!) 53   Ht 5' 6.5" (1.689 m)   Wt 184 lb 6.4 oz (83.6 kg)   SpO2 96%   BMI 29.32 kg/m   BP Readings from Last 3 Encounters:  05/31/23 130/78  04/01/23 133/78  02/26/23 (!) 140/84    Wt Readings from Last 3 Encounters:  05/31/23 184 lb 6.4 oz (83.6 kg)  04/01/23 190 lb 6.4 oz (86.4 kg)  02/26/23 199 lb 2 oz (90.3 kg)    Physical Exam Vitals reviewed.  Constitutional:      General: He is not in acute distress.    Appearance: Normal appearance. He is normal weight. He is not  ill-appearing, toxic-appearing or diaphoretic.  HENT:     Head: Normocephalic.  Eyes:     General: No scleral icterus.       Right eye: No discharge.        Left eye: No discharge.     Conjunctiva/sclera: Conjunctivae normal.  Cardiovascular:     Rate and Rhythm: Normal rate and regular rhythm.     Heart sounds: Normal heart sounds.  Pulmonary:     Effort: Pulmonary effort is normal. No respiratory distress.     Breath sounds: Normal breath sounds.  Musculoskeletal:        General: Normal range of motion.     Cervical back: Normal range of motion.  Skin:    General: Skin is warm and dry.  Neurological:     General: No focal deficit present.     Mental Status: He is alert and oriented to person, place, and time. Mental status is at baseline.  Psychiatric:        Mood and Affect: Mood normal.        Behavior: Behavior normal.        Thought Content: Thought content normal.        Judgment: Judgment normal.    Lab Results  Component Value Date   HGBA1C 6.3 05/25/2023   HGBA1C 6.4 09/03/2022   HGBA1C 6.3 04/17/2022    Lab Results  Component Value Date   CREATININE 0.85 05/25/2023   CREATININE 0.84 11/13/2022   CREATININE 0.84 09/03/2022    Lab Results  Component Value Date   WBC 5.3 05/25/2023   HGB 15.3 05/25/2023   HCT 44.9 05/25/2023   PLT 190.0 05/25/2023   GLUCOSE 107 (H) 05/25/2023   CHOL 123 05/25/2023   TRIG 103.0 05/25/2023   HDL 48.00 05/25/2023   LDLDIRECT 69.0 05/25/2023   LDLCALC 55 05/25/2023   ALT 31 05/25/2023   AST 25 05/25/2023   NA 133 (L) 05/25/2023   K 3.9 05/25/2023   CL 94 (L) 05/25/2023   CREATININE 0.85 05/25/2023   BUN 13 05/25/2023   CO2 30 05/25/2023   TSH 0.70 05/25/2023   PSA 0.60 10/13/2021   INR 1.0 02/15/2023   HGBA1C 6.3 05/25/2023   MICROALBUR <0.7 05/25/2023    No results found.  Assessment & Plan:  .HYPERTENSION, BENIGN -     Comprehensive metabolic panel; Future  Mixed hyperlipidemia Assessment & Plan: LDL  and triglycerides are at goal on  current Crestor dose. Marland Kitchen He has no side effects and liver enzymes are normal. No changes today   Lab Results  Component Value Date   CHOL 123 05/25/2023   HDL 48.00 05/25/2023   LDLCALC 55 05/25/2023   LDLDIRECT 69.0 05/25/2023   TRIG 103.0 05/25/2023   CHOLHDL 3 05/25/2023   Lab Results  Component Value Date   ALT 31 05/25/2023   AST 25 05/25/2023   ALKPHOS 59 05/25/2023   BILITOT 1.1 05/25/2023   .lastl  Orders: -     Lipid panel; Future -     LDL cholesterol, direct; Future  Prediabetes -     Comprehensive metabolic panel; Future -     Hemoglobin A1c; Future  Hepatic steatosis Assessment & Plan: With U/s suggestive of cirrhosis..  EGD normal (no varices). AFP normal . LFTS and coags normal  Lab Results  Component Value Date   ALT 31 05/25/2023   AST 25 05/25/2023   ALKPHOS 59 05/25/2023   BILITOT 1.1 05/25/2023   Lab Results  Component Value Date   INR 1.0 02/15/2023      Family history of colon cancer Assessment & Plan: Last done in 2021  ,  Continue 5 yr follow up for family history of colon CA in brother and grandmother.    Liver cirrhosis secondary to NASH [facility]) Assessment & Plan: With U/s suggestive of cirrhosis..  EGD normal (no varices). AFP normal . LFTS and coags normal  Lab Results  Component Value Date   ALT 31 05/25/2023   AST 25 05/25/2023   ALKPHOS 59 05/25/2023   BILITOT 1.1 05/25/2023        SI (sacroiliac) joint inflammation (HCC) Assessment & Plan: Managed with activity modification and Celebrex dose reduced to 100 mg every other day.  LFTS normal.    Other orders -     predniSONE; 6 tablets on Day 1 , then reduce by 1 tablet daily until gone  Dispense: 21 tablet; Refill: 0 -     Azithromycin; Take 1 tablet (500 mg total) by mouth daily.  Dispense: 7 tablet; Refill: 0 -     levoFLOXacin; Take 1 tablet (500 mg total) by mouth daily for 7 days.  Dispense: 7 tablet; Refill: 0     I  provided 30 minutes of face-to-face time during this encounter reviewing patient's last visit with me, patient's  most recent visit with gastroenterology,,  recent surgical and non surgical procedures, previous  labs and imaging studies, counseling on currently addressed issues,  and post visit ordering to diagnostics and therapeutics .   Follow-up: Return in about 6 months (around 11/29/2023) for physical.   Sherlene Shams, MD

## 2023-05-31 NOTE — Telephone Encounter (Signed)
Attempted to call pt. Number busy.

## 2023-05-31 NOTE — Assessment & Plan Note (Signed)
Managed with activity modification and Celebrex dose reduced to 100 mg every other day.  LFTS normal.

## 2023-05-31 NOTE — Assessment & Plan Note (Signed)
With U/s suggestive of cirrhosis..  EGD normal (no varices). AFP normal . LFTS and coags normal  Lab Results  Component Value Date   ALT 31 05/25/2023   AST 25 05/25/2023   ALKPHOS 59 05/25/2023   BILITOT 1.1 05/25/2023   Lab Results  Component Value Date   INR 1.0 02/15/2023

## 2023-05-31 NOTE — Assessment & Plan Note (Addendum)
Last done in 2021  ,  Continue 5 yr follow up for family history of colon CA in brother and grandmother.

## 2023-05-31 NOTE — Assessment & Plan Note (Signed)
With U/s suggestive of cirrhosis..  EGD normal (no varices). AFP normal . LFTS and coags normal  Lab Results  Component Value Date   ALT 31 05/25/2023   AST 25 05/25/2023   ALKPHOS 59 05/25/2023   BILITOT 1.1 05/25/2023

## 2023-06-03 NOTE — Telephone Encounter (Signed)
 Spoke with pt and scheduled his lab appt.

## 2023-06-11 ENCOUNTER — Encounter: Payer: Self-pay | Admitting: Internal Medicine

## 2023-07-16 ENCOUNTER — Other Ambulatory Visit: Payer: Self-pay

## 2023-07-18 ENCOUNTER — Encounter: Payer: Self-pay | Admitting: Internal Medicine

## 2023-07-19 ENCOUNTER — Ambulatory Visit (INDEPENDENT_AMBULATORY_CARE_PROVIDER_SITE_OTHER): Payer: Medicare PPO | Admitting: Gastroenterology

## 2023-07-19 VITALS — BP 175/97 | HR 74 | Temp 97.9°F | Wt 188.0 lb

## 2023-07-19 DIAGNOSIS — M6530 Trigger finger, unspecified finger: Secondary | ICD-10-CM | POA: Insufficient documentation

## 2023-07-19 DIAGNOSIS — T63441A Toxic effect of venom of bees, accidental (unintentional), initial encounter: Secondary | ICD-10-CM | POA: Insufficient documentation

## 2023-07-19 DIAGNOSIS — K760 Fatty (change of) liver, not elsewhere classified: Secondary | ICD-10-CM | POA: Diagnosis not present

## 2023-07-19 DIAGNOSIS — H409 Unspecified glaucoma: Secondary | ICD-10-CM | POA: Insufficient documentation

## 2023-07-19 DIAGNOSIS — D126 Benign neoplasm of colon, unspecified: Secondary | ICD-10-CM | POA: Insufficient documentation

## 2023-07-19 DIAGNOSIS — M170 Bilateral primary osteoarthritis of knee: Secondary | ICD-10-CM | POA: Insufficient documentation

## 2023-07-19 DIAGNOSIS — M719 Bursopathy, unspecified: Secondary | ICD-10-CM | POA: Insufficient documentation

## 2023-07-19 NOTE — Progress Notes (Signed)
 Arlyss Repress, MD [address]  [address]  [place], [place] [address]  Main: [phone]  Fax: [phone]    Gastroenterology Consultation  Referring Provider:     Sherlene Shams, MD Primary Care Physician:  Sherlene Shams, MD Primary Gastroenterologist:  Dr. Arlyss Repress Reason for Consultation:     Elevated LFTs, fatty liver        HPI:   Mark Farley. is a 78 y.o. Caucasian male referred by Dr. Sherlene Shams, MD  for consultation & management of elevated LFTs.  Patient is first noted to have mildly elevated transaminases in 01/2017, AST 46, ALT 73.  His transaminases have been persistently elevated since 03/2018 about 2-3 times upper limit of normal.  Work-up thus far revealed elevated ferritin levels only.  Hep C antibody negative, he received Twinrix vaccine first dose.  ANA, anti-smooth muscle and antimitochondrial antibodies negative.  Patient was recommended to lose 10% of his body weight which he tried to but regained it back when he went to cruise.  He previously had low ferritin levels as well as anemia for which he underwent work-up which was negative for thalassemia.  TSH normal, hemoglobin A1c normal.  He underwent ultrasound abdomen which revealed fatty liver only He reports drinking 1-2 beers daily and sometimes wine for several years.  He does not smoke He denies using herbal supplements.  He is a Cytogeneticist.  He denies IV drug abuse, no history of tattoo or blood transfusions.  He is here to discuss about abnormal ultrasound showing nodularity of the liver as of 06/2022.  He also has mild thrombocytopenia.  Patient has been gaining weight, does admit to eating more bread, pasta because he is [place].  He does drink carbonated beverages daily.  He does go to gym on a regular basis.  He does have 3 glasses of wine per week.  Most recent LFTs as of 10/2022 were normal.  Follow-up visit 07/19/2023 Mark Farley is here for follow-up of elevated LFTs.  He has  done remarkably well since last visit.  He lost weight by following healthy lifestyle including diet and exercise.  He recently returned from cruise, has gained about 5 pounds and trying to lose that weight.  His LFTs since June 2024 have been normal.  No evidence of cirrhosis of liver.  EGD was unremarkable.     Latest Ref Rng & Units 05/25/2023    7:55 AM 11/13/2022   10:25 AM 09/03/2022    8:39 AM  Hepatic Function  Total Protein 6.0 - 8.3 g/dL 7.1  7.2  6.5   Albumin 3.5 - 5.2 g/dL 4.4  4.2  4.3   AST 0 - 37 U/L 25  33  40   ALT 0 - 53 U/L 31  43  47   Alk Phosphatase 39 - 117 U/L 59  59  68   Total Bilirubin 0.2 - 1.2 mg/dL 1.1  1.0  1.1    NSAIDs: None  Antiplts/Anticoagulants/Anti thrombotics: None  GI Procedures: Colonoscopy in 12/2014 in [place], [place] Polyps were detected Upper endoscopy 04/01/2023 - Normal duodenal bulb and second portion of the duodenum. - Normal stomach. - Ectopic gastric mucosa in the upper third of the esophagus. - No specimens collected.  - The examined portion of the ileum was normal. - The entire examined colon is normal. - The distal rectum and anal verge are normal on retroflexion view. - No specimens collected.  Past  Medical History:  Diagnosis Date   Arthritis    ? Gout   Bilateral carpal tunnel syndrome    managed with nocturnal braces x 3 years   COPD (chronic obstructive pulmonary disease) (HCC)    Dysrhythmia    Tachycardia, PVCs, SVTs   GERD (gastroesophageal reflux disease)    Glucose intolerance (impaired glucose tolerance)    Heart murmur    as a child   History of syncope 2005   Unexplained, myoview negative   Hyperlipidemia    Hypertension    Iron deficiency anemia    Osteoarthritis of multiple joints    Palpitations 2010   Life Watch with single PVC   Pre-diabetes     Past Surgical History:  Procedure Laterality Date   APPENDECTOMY     1957   COLONOSCOPY     COLONOSCOPY WITH PROPOFOL N/A 01/24/2020   Procedure:  COLONOSCOPY WITH PROPOFOL;  Surgeon: Toney Reil, MD;  Location: [facility] ENDOSCOPY;  Service: Gastroenterology;  Laterality: N/A;   ESOPHAGOGASTRODUODENOSCOPY (EGD) WITH PROPOFOL N/A 04/01/2023   Procedure: ESOPHAGOGASTRODUODENOSCOPY (EGD) WITH PROPOFOL;  Surgeon: Toney Reil, MD;  Location: [facility] ENDOSCOPY;  Service: Gastroenterology;  Laterality: N/A;   JOINT REPLACEMENT     TONSILLECTOMY     1952   TOTAL KNEE ARTHROPLASTY Right 03/14/2019   Procedure: RIGHT TOTAL KNEE ARTHROPLASTY;  Surgeon: Cammy Copa, MD;  Location: [facility] OR;  Service: Orthopedics;  Laterality: Right;   TOTAL KNEE ARTHROPLASTY Left 06/22/2019   Procedure: LEFT TOTAL KNEE ARTHROPLASTY;  Surgeon: Cammy Copa, MD;  Location: [facility];  Service: Orthopedics;  Laterality: Left;     Current Outpatient Medications:    aspirin EC 81 MG tablet, Take 81 mg by mouth daily. Swallow whole., Disp: , Rfl:    azithromycin (ZITHROMAX) 500 MG tablet, Take 1 tablet (500 mg total) by mouth daily., Disp: 7 tablet, Rfl: 0   celecoxib (CELEBREX) 100 MG capsule, Take 100 mg by mouth 2 (two) times daily., Disp: , Rfl:    Cyanocobalamin (VITAMIN B 12) 100 MCG LOZG, Take by mouth daily., Disp: , Rfl:    dorzolamide-timolol (COSOPT) 2-0.5 % ophthalmic solution, Place 1 drop into both eyes 2 (two) times daily., Disp: , Rfl:    EPINEPHrine 0.3 mg/0.3 mL IJ SOAJ injection, Inject 0.3 mg into the muscle as needed for anaphylaxis., Disp: , Rfl:    hydrALAZINE (APRESOLINE) 25 MG tablet, Take 1 tablet (25 mg total) by mouth 3 (three) times daily as needed (for pressure >150)., Disp: 30 tablet, Rfl: 3   hydrochlorothiazide (HYDRODIURIL) 25 MG tablet, Take 1 tablet (25 mg total) by mouth daily., Disp: 90 tablet, Rfl: 3   lidocaine (LIDODERM) 5 %, Place 1 patch onto the skin daily. Remove & Discard patch within 12 hours or as directed by MD, Disp: 90 patch, Rfl: 3   metoprolol succinate (TOPROL-XL) 50 MG 24 hr tablet, Take 1 tablet (50  mg total) by mouth daily., Disp: 90 tablet, Rfl: 3   predniSONE (DELTASONE) 10 MG tablet, 6 tablets on Day 1 , then reduce by 1 tablet daily until gone, Disp: 21 tablet, Rfl: 0   rosuvastatin (CRESTOR) 10 MG tablet, Take 10 mg by mouth daily., Disp: , Rfl:    sildenafil (VIAGRA) 50 MG tablet, Take 1 tablet (50 mg total) by mouth daily as needed for erectile dysfunction., Disp: 10 tablet, Rfl: 2   telmisartan (MICARDIS) 80 MG tablet, Take 1 tablet (80 mg total) by mouth daily., Disp: 90 tablet,  Rfl: 3   timolol (TIMOPTIC) 0.5 % ophthalmic solution, 1 drop 2 (two) times daily., Disp: , Rfl:    traMADol (ULTRAM) 50 MG tablet, Take 2 tablets (100 mg total) by mouth every 6 (six) hours as needed., Disp: 240 tablet, Rfl: 0   valACYclovir (VALTREX) 1000 MG tablet, Take 1-2 g by mouth daily as needed (cold sores/fever blisters). , Disp: , Rfl:    diltiazem (CARDIZEM CD) 120 MG 24 hr capsule, Take 1 capsule (120 mg total) by mouth in the morning and at bedtime., Disp: 180 capsule, Rfl: 3  Family History  Problem Relation Age of Onset   Coronary artery disease Mother    Heart attack Mother        MI   Heart failure Father        CHF   Colon cancer Brother    Colon cancer Paternal Grandmother      Social History   Tobacco Use   Smoking status: Never   Smokeless tobacco: Never  Vaping Use   Vaping status: Never Used  Substance Use Topics   Alcohol use: Yes    Comment: 1 glass of wine or a beer 2 to 3 days a week   Drug use: No    Allergies as of 07/19/2023 - Review Complete 07/19/2023  Allergen Reaction Noted   Bee venom Swelling and Rash 04/02/2012   Other Rash 04/02/2012    Review of Systems:    All systems reviewed and negative except where noted in HPI.   Physical Exam:  BP (!) 175/97 (BP Location: Left Arm, Patient Position: Sitting, Cuff Size: Normal)   Pulse 74   Temp 97.9 F (36.6 C) (Oral)   Wt 188 lb (85.3 kg)   BMI 29.89 kg/m  No LMP for male patient.  General:    Alert,  Well-developed, well-nourished, pleasant and cooperative in NAD Head:  Normocephalic and atraumatic. Eyes:  Sclera clear, no icterus.   Conjunctiva pink. Ears:  Normal auditory acuity. Nose:  No deformity, discharge, or lesions. Mouth:  No deformity or lesions,oropharynx pink & moist. Neck:  Supple; no masses or thyromegaly. Lungs:  Respirations even and unlabored.  Clear throughout to auscultation.   No wheezes, crackles, or rhonchi. No acute distress. Heart:  Regular rate and rhythm; no murmurs, clicks, rubs, or gallops. Abdomen:  Normal bowel sounds. Soft, non-tender and non-distended without masses, hepatosplenomegaly or hernias noted.  No guarding or rebound tenderness.   Rectal: Not performed Msk:  Symmetrical without gross deformities. Good, equal movement & strength bilaterally. Pulses:  Normal pulses noted. Extremities:  No clubbing or edema.  No cyanosis. Neurologic:  Alert and oriented x3;  grossly normal neurologically. Skin:  Intact without significant lesions or rashes. No jaundice. Psych:  Alert and cooperative. Normal mood and affect.  Imaging Studies: Reviewed  Assessment and Plan:   Mark Farley. is a 78 y.o. Caucasian male with obesity, prediabetes, hypertension, fatty liver disease Secondary liver disease workup is negative.  Ultrasound of the liver from 06/2022 revealed nodular counter raising the possibility of cirrhosis of liver.  Ultrasound liver Doppler with no evidence of portal hypertension or splenomegaly.  EGD was unremarkable.  Serum AFP levels were normal.  There was no biochemical evidence of cirrhosis of liver. Congratulated on adapting healthy lifestyle which helped in normalization of transaminases  Follow up as needed   Arlyss Repress, MD

## 2023-07-23 ENCOUNTER — Encounter: Payer: Self-pay | Admitting: Cardiovascular Disease

## 2023-07-26 MED ORDER — SILDENAFIL CITRATE 50 MG PO TABS: 50.0000 mg | ORAL_TABLET | Freq: Every day | ORAL | 1 refills | Status: DC | PRN

## 2023-08-19 DIAGNOSIS — D2339 Other benign neoplasm of skin of other parts of face: Secondary | ICD-10-CM | POA: Diagnosis not present

## 2023-08-19 DIAGNOSIS — R208 Other disturbances of skin sensation: Secondary | ICD-10-CM | POA: Diagnosis not present

## 2023-08-19 DIAGNOSIS — L918 Other hypertrophic disorders of the skin: Secondary | ICD-10-CM | POA: Diagnosis not present

## 2023-08-19 DIAGNOSIS — B078 Other viral warts: Secondary | ICD-10-CM | POA: Diagnosis not present

## 2023-08-20 ENCOUNTER — Encounter: Payer: Self-pay | Admitting: Internal Medicine

## 2023-08-23 MED ORDER — LIDOCAINE 5 % EX PTCH: 1.0000 | MEDICATED_PATCH | CUTANEOUS | 3 refills | Status: DC

## 2023-09-20 DIAGNOSIS — M7631 Iliotibial band syndrome, right leg: Secondary | ICD-10-CM | POA: Diagnosis not present

## 2023-09-20 DIAGNOSIS — M7061 Trochanteric bursitis, right hip: Secondary | ICD-10-CM | POA: Diagnosis not present

## 2023-10-21 ENCOUNTER — Encounter: Payer: Self-pay | Admitting: Cardiovascular Disease

## 2023-10-22 MED ORDER — DILTIAZEM HCL ER COATED BEADS 120 MG PO CP24: 120.0000 mg | ORAL_CAPSULE | Freq: Two times a day (BID) | ORAL | 3 refills | Status: DC

## 2023-11-22 ENCOUNTER — Encounter: Payer: Self-pay | Admitting: Internal Medicine

## 2023-11-24 ENCOUNTER — Other Ambulatory Visit (INDEPENDENT_AMBULATORY_CARE_PROVIDER_SITE_OTHER)

## 2023-11-24 DIAGNOSIS — E782 Mixed hyperlipidemia: Secondary | ICD-10-CM

## 2023-11-24 DIAGNOSIS — I10 Essential (primary) hypertension: Secondary | ICD-10-CM | POA: Diagnosis not present

## 2023-11-24 DIAGNOSIS — R7303 Prediabetes: Secondary | ICD-10-CM | POA: Diagnosis not present

## 2023-11-24 LAB — HEMOGLOBIN A1C: Hgb A1c MFr Bld: 6.4 % (ref 4.6–6.5)

## 2023-11-24 LAB — COMPREHENSIVE METABOLIC PANEL WITH GFR
ALT: 21 U/L (ref 0–53)
AST: 22 U/L (ref 0–37)
Albumin: 4.2 g/dL (ref 3.5–5.2)
Alkaline Phosphatase: 46 U/L (ref 39–117)
BUN: 13 mg/dL (ref 6–23)
CO2: 29 meq/L (ref 19–32)
Calcium: 9.1 mg/dL (ref 8.4–10.5)
Chloride: 97 meq/L (ref 96–112)
Creatinine, Ser: 0.92 mg/dL (ref 0.40–1.50)
GFR: 79.83 mL/min (ref >60.00)
Glucose, Bld: 111 mg/dL — ABNORMAL HIGH (ref 70–99)
Potassium: 4 meq/L (ref 3.5–5.1)
Sodium: 134 meq/L — ABNORMAL LOW (ref 135–145)
Total Bilirubin: 1.4 mg/dL — ABNORMAL HIGH (ref 0.2–1.2)
Total Protein: 6.4 g/dL (ref 6.0–8.3)

## 2023-11-24 LAB — LIPID PANEL
Cholesterol: 130 mg/dL (ref 0–200)
HDL: 50 mg/dL (ref >39.00)
LDL Cholesterol: 61 mg/dL (ref 0–99)
NonHDL: 80.05
Total CHOL/HDL Ratio: 3
Triglycerides: 94 mg/dL (ref 0.0–149.0)
VLDL: 18.8 mg/dL (ref 0.0–40.0)

## 2023-11-24 LAB — LDL CHOLESTEROL, DIRECT: Direct LDL: 68 mg/dL

## 2023-11-25 ENCOUNTER — Other Ambulatory Visit: Payer: Medicare PPO

## 2023-11-26 ENCOUNTER — Ambulatory Visit: Payer: Self-pay | Admitting: Internal Medicine

## 2023-11-29 ENCOUNTER — Ambulatory Visit (INDEPENDENT_AMBULATORY_CARE_PROVIDER_SITE_OTHER): Payer: Medicare PPO | Admitting: Internal Medicine

## 2023-11-29 ENCOUNTER — Encounter: Payer: Self-pay | Admitting: Internal Medicine

## 2023-11-29 VITALS — BP 130/82 | HR 74 | Ht 66.5 in | Wt 189.2 lb

## 2023-11-29 DIAGNOSIS — R7303 Prediabetes: Secondary | ICD-10-CM | POA: Diagnosis not present

## 2023-11-29 DIAGNOSIS — N3289 Other specified disorders of bladder: Secondary | ICD-10-CM

## 2023-11-29 DIAGNOSIS — E782 Mixed hyperlipidemia: Secondary | ICD-10-CM | POA: Diagnosis not present

## 2023-11-29 DIAGNOSIS — R338 Other retention of urine: Secondary | ICD-10-CM

## 2023-11-29 DIAGNOSIS — N401 Enlarged prostate with lower urinary tract symptoms: Secondary | ICD-10-CM

## 2023-11-29 DIAGNOSIS — K7581 Nonalcoholic steatohepatitis (NASH): Secondary | ICD-10-CM | POA: Diagnosis not present

## 2023-11-29 MED ORDER — DOXAZOSIN MESYLATE 1 MG PO TABS: 1.0000 mg | ORAL_TABLET | Freq: Every evening | ORAL | 2 refills | Status: DC

## 2023-11-29 NOTE — Assessment & Plan Note (Signed)
 No evidence of cirrhosis per GI consult. AFP tumor marker negative .  LFts normal

## 2023-11-29 NOTE — Progress Notes (Signed)
 Subjective:  Patient ID: Mark JINNY Savannah Mickey., male    DOB: [birth year]  Age: 78 y.o. MRN: [id number]  CC: The primary encounter diagnosis was Prediabetes. Diagnoses of Mixed hyperlipidemia, Metabolic dysfunction-associated steatohepatitis (MASH), Urinary retention due to benign prostatic hyperplasia, and Distended bladder were also pertinent to this visit.   HPI Mark Antony Savannah Mickey. presents for  Chief Complaint  Patient presents with   Medical Management of Chronic Issues    6 month follow up     TREATED FOR RIGHT HIP  PAIN ATTRIBUTED TO IT BAND PROBLEMS WITH STEROID INJECTI0N BY ORTHOPEDICS .  IMMEDIATE RESULTS  LEFT LOWER ABD PAIN HAS PERSISTEnt but mild.  Has   moderate Urinary retention noted on prior PVR.  Did not tolerate flomax  due to retrograde  ejadulation.  Willing to try doxazosin   Weight gain:  frustrated at inability to lse the last 5 lbs.  Does cardio 5 days per week (treadmill and recumbent bike,  but reads while on the machine)    and works in the yard a lot.  Diet is heavy on starches (pasta , bread,  potatoes)  HLD:  reviewed coronary calcium  score > 600.  Taking rosuvastatin .  Worried abut his relatively low  HDL (50) and Total cholesterol /. HDL  ratio   Outpatient Medications Prior to Visit  Medication Sig Dispense Refill   aspirin  EC 81 MG tablet Take 81 mg by mouth daily. Swallow whole.     celecoxib  (CELEBREX ) 100 MG capsule Take 100 mg by mouth 2 (two) times daily.     Cyanocobalamin (VITAMIN B 12) 100 MCG LOZG Take by mouth daily.     diltiazem  (CARDIZEM  CD) 120 MG 24 hr capsule Take 1 capsule (120 mg total) by mouth in the morning and at bedtime. 180 capsule 3   dorzolamide-timolol (COSOPT) 2-0.5 % ophthalmic solution Place 1 drop into both eyes 2 (two) times daily.     EPINEPHrine 0.3 mg/0.3 mL IJ SOAJ injection Inject 0.3 mg into the muscle as needed for anaphylaxis.     hydrALAZINE  (APRESOLINE ) 25 MG tablet Take 1 tablet (25 mg total) by mouth 3  (three) times daily as needed (for pressure >150). 30 tablet 3   hydrochlorothiazide  (HYDRODIURIL ) 25 MG tablet Take 1 tablet (25 mg total) by mouth daily. 90 tablet 3   lidocaine  (LIDODERM ) 5 % Place 1 patch onto the skin daily. Remove & Discard patch within 12 hours or as directed by MD 90 patch 3   metoprolol  succinate (TOPROL -XL) 50 MG 24 hr tablet Take 1 tablet (50 mg total) by mouth daily. 90 tablet 3   rosuvastatin  (CRESTOR ) 10 MG tablet Take 10 mg by mouth daily.     sildenafil  (VIAGRA ) 50 MG tablet Take 1 tablet (50 mg total) by mouth daily as needed for erectile dysfunction. 30 tablet 1   telmisartan  (MICARDIS ) 80 MG tablet Take 1 tablet (80 mg total) by mouth daily. 90 tablet 3   timolol (TIMOPTIC) 0.5 % ophthalmic solution 1 drop 2 (two) times daily.     traMADol  (ULTRAM ) 50 MG tablet Take 2 tablets (100 mg total) by mouth every 6 (six) hours as needed. 240 tablet 0   valACYclovir (VALTREX) 1000 MG tablet Take 1-2 g by mouth daily as needed (cold sores/fever blisters).      azithromycin  (ZITHROMAX ) 500 MG tablet Take 1 tablet (500 mg total) by mouth daily. 7 tablet 0   predniSONE  (DELTASONE ) 10 MG tablet 6 tablets on Day 1 ,  then reduce by 1 tablet daily until gone 21 tablet 0   No facility-administered medications prior to visit.    Review of Systems;  Patient denies headache, fevers, malaise, unintentional weight loss, skin rash, eye pain, sinus congestion and sinus pain, sore throat, dysphagia,  hemoptysis , cough, dyspnea, wheezing, chest pain, palpitations, orthopnea, edema, abdominal pain, nausea, melena, diarrhea, constipation, flank pain, dysuria, hematuria, urinary  Frequency, nocturia, numbness, tingling, seizures,  Focal weakness, Loss of consciousness,  Tremor, insomnia, depression, anxiety, and suicidal ideation.      Objective:  BP 130/82   Pulse 74   Ht 5' 6.5 (1.689 m)   Wt 189 lb 3.2 oz (85.8 kg)   SpO2 97%   BMI 30.08 kg/m   BP Readings from Last 3  Encounters:  11/29/23 130/82  07/19/23 (!) 175/97  05/31/23 130/78    Wt Readings from Last 3 Encounters:  11/29/23 189 lb 3.2 oz (85.8 kg)  07/19/23 188 lb (85.3 kg)  05/31/23 184 lb 6.4 oz (83.6 kg)    Physical Exam Vitals reviewed.  Constitutional:      General: He is not in acute distress.    Appearance: Normal appearance. He is normal weight. He is not ill-appearing, toxic-appearing or diaphoretic.  HENT:     Head: Normocephalic.   Eyes:     General: No scleral icterus.       Right eye: No discharge.        Left eye: No discharge.     Conjunctiva/sclera: Conjunctivae normal.    Cardiovascular:     Rate and Rhythm: Normal rate and regular rhythm.     Heart sounds: Normal heart sounds.  Pulmonary:     Effort: Pulmonary effort is normal. No respiratory distress.     Breath sounds: Normal breath sounds.   Musculoskeletal:        General: Normal range of motion.     Cervical back: Normal range of motion.   Skin:    General: Skin is warm and dry.   Neurological:     General: No focal deficit present.     Mental Status: He is alert and oriented to person, place, and time. Mental status is at baseline.   Psychiatric:        Mood and Affect: Mood normal.        Behavior: Behavior normal.        Thought Content: Thought content normal.        Judgment: Judgment normal.     Lab Results  Component Value Date   HGBA1C 6.4 11/24/2023   HGBA1C 6.3 05/25/2023   HGBA1C 6.4 09/03/2022    Lab Results  Component Value Date   CREATININE 0.92 11/24/2023   CREATININE 0.85 05/25/2023   CREATININE 0.84 11/13/2022    Lab Results  Component Value Date   WBC 5.3 05/25/2023   HGB 15.3 05/25/2023   HCT 44.9 05/25/2023   PLT 190.0 05/25/2023   GLUCOSE 111 (H) 11/24/2023   CHOL 130 11/24/2023   TRIG 94.0 11/24/2023   HDL 50.00 11/24/2023   LDLDIRECT 68.0 11/24/2023   LDLCALC 61 11/24/2023   ALT 21 11/24/2023   AST 22 11/24/2023   NA 134 (L) 11/24/2023   K 4.0  11/24/2023   CL 97 11/24/2023   CREATININE 0.92 11/24/2023   BUN 13 11/24/2023   CO2 29 11/24/2023   TSH 0.70 05/25/2023   PSA 0.60 10/13/2021   INR 1.0 02/15/2023   HGBA1C 6.4 11/24/2023  No results found.  Assessment & Plan:  .Prediabetes -     Comprehensive metabolic panel with GFR; Future -     Hemoglobin A1c; Future  Mixed hyperlipidemia -     Lipid panel; Future  Metabolic dysfunction-associated steatohepatitis (MASH) Assessment & Plan: No evidence of cirrhosis per GI consult. AFP tumor marker negative .  LFts normal   Urinary retention due to benign prostatic hyperplasia Assessment & Plan: Did not tolerate Flomax   due to retrograde ejaculation.  Trial of cardura    Distended bladder Assessment & Plan: Presumed to be the cause of his suprapubic pain that is intermittent and mild.  Asked to keep diary of symptoms to determine if there is a temporal relationship    Other orders -     Doxazosin Mesylate; Take 1 tablet (1 mg total) by mouth at bedtime.  Dispense: 30 tablet; Refill: 2     I spent 34 minutes on the day of this face to face encounter reviewing patient's  most recent visit with cardiology,  gastroenterology,  prior relevant surgical and non surgical procedures, recent  labs and imaging studies, counseling on weight management,  reviewing the assessment and plan with patient, and post visit ordering and reviewing of  diagnostics and therapeutics with patient  .   Follow-up: Return in about 6 months (around 05/30/2024).   Verneita LITTIE Kettering, MD

## 2023-11-29 NOTE — Assessment & Plan Note (Signed)
 Presumed to be the cause of his suprapubic pain that is intermittent and mild.  Asked to keep diary of symptoms to determine if there is a temporal relationship

## 2023-11-29 NOTE — Patient Instructions (Signed)
 Your cholesterol is at goal on your current medication  (ldl < 70) but  your A1c is climbing, normal, which means you are at risk for developing diabetes .  Given your frustration with your weight,  I recommend cutting out potatoes (try using turnips instead) ,  because reducing your starch servings per day  will help ( max serving of starch 2 , not 4)    Make your cardio harder,  not longer!  You should be breathing hard enough to talk but not sing

## 2023-11-29 NOTE — Assessment & Plan Note (Signed)
 Did not tolerate Flomax   due to retrograde ejaculation.  Trial of cardura

## 2024-02-03 DIAGNOSIS — L821 Other seborrheic keratosis: Secondary | ICD-10-CM | POA: Diagnosis not present

## 2024-02-03 DIAGNOSIS — H01131 Eczematous dermatitis of right upper eyelid: Secondary | ICD-10-CM | POA: Diagnosis not present

## 2024-02-03 DIAGNOSIS — L538 Other specified erythematous conditions: Secondary | ICD-10-CM | POA: Diagnosis not present

## 2024-02-03 DIAGNOSIS — H01132 Eczematous dermatitis of right lower eyelid: Secondary | ICD-10-CM | POA: Diagnosis not present

## 2024-02-03 DIAGNOSIS — L2989 Other pruritus: Secondary | ICD-10-CM | POA: Diagnosis not present

## 2024-02-03 DIAGNOSIS — H01139 Eczematous dermatitis of unspecified eye, unspecified eyelid: Secondary | ICD-10-CM | POA: Diagnosis not present

## 2024-02-03 DIAGNOSIS — L820 Inflamed seborrheic keratosis: Secondary | ICD-10-CM | POA: Diagnosis not present

## 2024-02-03 DIAGNOSIS — L089 Local infection of the skin and subcutaneous tissue, unspecified: Secondary | ICD-10-CM | POA: Diagnosis not present

## 2024-02-21 DIAGNOSIS — M79672 Pain in left foot: Secondary | ICD-10-CM | POA: Diagnosis not present

## 2024-02-21 DIAGNOSIS — L600 Ingrowing nail: Secondary | ICD-10-CM | POA: Diagnosis not present

## 2024-03-05 ENCOUNTER — Encounter: Payer: Self-pay | Admitting: Internal Medicine

## 2024-03-06 MED ORDER — OSELTAMIVIR PHOSPHATE 75 MG PO CAPS: 75.0000 mg | ORAL_CAPSULE | Freq: Two times a day (BID) | ORAL | 0 refills | Status: DC

## 2024-03-06 MED ORDER — PROMETHAZINE HCL 12.5 MG PO TABS: 12.5000 mg | ORAL_TABLET | Freq: Three times a day (TID) | ORAL | 0 refills | Status: DC | PRN

## 2024-03-13 ENCOUNTER — Encounter: Payer: Self-pay | Admitting: Internal Medicine

## 2024-03-14 MED ORDER — DOXAZOSIN MESYLATE 1 MG PO TABS: 1.0000 mg | ORAL_TABLET | Freq: Every evening | ORAL | 1 refills | Status: DC

## 2024-04-02 NOTE — Progress Notes (Unsigned)
 Evaluation Performed:  Follow-up visit  Date:  04/03/2024   ID:  Mark Farley., DOB 1946-01-21, MRN [id number]  Patient Location:  [address] [place] [address]   Provider location:   [facility], [place] office  PCP:  Marylynn Verneita CROME, MD  Cardiologist:  Perla MOCCASIN [facility]   Chief Complaint  Patient presents with   12 month follow up     Patient denies any chest pain or shortness of breath.    History of Present Illness:    Isael Stille. is a 78 y.o. male past medical history of HTN,  hyperlipidemia previous syncope, negative Myoview in 2005. h/o palpitations and had a Life Watch monitor for several weeks but no events arthritis,  anemia from iron  deficiency Holter monitor showing runs of SVT, rate up to 150 bpm CT coronary calcium  scoring July 2020,Score 638, 73rd percentile Aorta with mild atherosclerosis on CT He presents today for follow-up of his palpitations, hypertension, CAD  Last seen in clinic by myself 01/2023 Just got back from cruise, travels [place]  Still working, lots of activity outdoors yard work Works out at [employer], gym 4-5 a week Aerobic mostly Thinking of going back to go to gym to do more machines   No arrhythmia, denies near-syncope or syncope No significant tachycardia  Blood pressure well-controlled at home, sometimes low Heart rate often in the 50s, low 60  Continues on diltiazem  ER 120 twice daily with metoprolol  succinate 50 daily  Lab work reviewed Total cholesterol 130 LDL 61 A1c 6.4  EKG personally reviewed by myself on todays visit EKG Interpretation Date/Time:  Monday April 03 2024 08:11:22 EST Ventricular Rate:  60 PR Interval:  210 QRS Duration:  98 QT Interval:  438 QTC Calculation: 438 R Axis:   16  Text Interpretation: Sinus rhythm with 1st degree A-V block When compared with ECG of 26-Feb-2023 08:19, No significant change was found Confirmed by Perla Lye [id number]) on 04/03/2024 8:13:02 AM   Other past medical history reviewed Low heart rate when seen by PT Seen by Dr. Mady July 15, 2022 for bradycardia in the setting of ectopy Zio monitor with frequent PACs, SVT runs  Seen by EP May 2024 Diltiazem  increased up to 120 mg twice a day   Other past medical history reviewed Zio monitor ordered February 2024  - 14 days.   The predominant rhythm was sinus with an average rate of 69 bpm (range 47-95 bpm in sinus).   There frequent PAC's (PAC burden 19.2%) and rare PVC's.   30,778 supraventricular runs were recorded, lasting up to 4:10 minutes with a maximum rate of 179 bpm.   No prolonged pause was observed.   Patient triggered event corresponds to sinus rhythm with PAC's, PVC's, and PSVT.  Event monitor June 2023 Normal sinus rhythm Patient had a min HR of 49 bpm, max HR of 184 bpm, and avg HR of 66 bpm.   38 Supraventricular Tachycardia/atrial tachycardia runs occurred, the run with the fastest interval lasting 5 beats with a max rate of 184 bpm, the longest lasting 18.1 secs with an avg rate of 156 bpm.     Idioventricular Rhythm was present.    Isolated SVEs were rare (<1.0%), SVE Couplets were rare (<1.0%), and SVE Triplets were rare (<1.0%). Isolated VEs were occasional (1.5%, [id number]), VE Couplets were rare (<1.0%, 109), and VE Triplets were rare (<1.0%, 43). Ventricular Bigeminy and Trigeminy were present.  4 patient triggered events noted, 2 associated with short run of narrow complex tachycardia, other triggered events with normal rhythm, PVCs  Syncope 07/24/20,wife heard him fall,got up too quickly Was working on the ground, got up quickly Concern for prerenal state   Lab Results  Component Value Date   CHOL 130 11/24/2023   HDL 50.00 11/24/2023   LDLCALC 61 11/24/2023   TRIG 94.0 11/24/2023    Past medical history reviewed severe series of tachycardia episodes day after Thanksgiving 2016 Reported he was driving  in a car back from a trip, tachycardia seem to happen every 5 minutes. He took several diltiazem , possibly up to 3 or 4 and eventually symptoms seem to improve.  Holter monitor showing frequent episodes of SVT, short-lived total cholesterol less than 150   Past Medical History:  Diagnosis Date   Allergy 11/2010   severe swelling   Arthritis    ? Gout   Bilateral carpal tunnel syndrome    managed with nocturnal braces x 3 years   COPD (chronic obstructive pulmonary disease) (HCC)    Dysrhythmia    Tachycardia, PVCs, SVTs   GERD (gastroesophageal reflux disease)    Glucose intolerance (impaired glucose tolerance)    Heart murmur    as a child   History of syncope 2005   Unexplained, myoview negative   Hyperlipidemia    Hypertension    Iron  deficiency anemia    Osteoarthritis of multiple joints    Palpitations 2010   Life Watch with single PVC   Pre-diabetes    Past Surgical History:  Procedure Laterality Date   APPENDECTOMY     1957   COLONOSCOPY     COLONOSCOPY WITH PROPOFOL  N/A 01/24/2020   Procedure: COLONOSCOPY WITH PROPOFOL ;  Surgeon: Unk Corinn Skiff, MD;  Location: [facility] END[facility]COPY;  Service: Gastroenterology;  Laterality: N/A;   ESOPHAGOGASTRODUODENOSCOPY (EGD) WITH PROPOFOL  N/A 04/01/2023   Procedure: ESOPHAGOGASTRODUODENOSCOPY (EGD) WITH PROPOFOL ;  Surgeon: Unk Corinn Skiff, MD;  Location: [facility] ENDOSCOPY;  Service: Gastroenterology;  Laterality: N/A;   JOINT REPLACEMENT  03/14/2019   Right knee total   TONSILLECTOMY     1952   TOTAL KNEE ARTHROPLASTY Right 03/14/2019   Procedure: RIGHT TOTAL KNEE ARTHROPLASTY;  Surgeon: Addie Cordella Hamilton, MD;  Location: [facility] OR;  Service: Orthopedics;  Laterality: Right;   TOTAL KNEE ARTHROPLASTY Left 06/22/2019   Procedure: LEFT TOTAL KNEE ARTHROPLASTY;  Surgeon: Addie Cordella Hamilton, MD;  Location: [facility] OR;  Service: Orthopedics;  Laterality: Left;     Current Meds  Medication Sig   aspirin  EC 81 MG tablet Take 81 mg by  mouth daily. Swallow whole.   celecoxib  (CELEBREX ) 100 MG capsule Take 100 mg by mouth 2 (two) times daily.   Cyanocobalamin (VITAMIN B 12) 100 MCG LOZG Take by mouth daily.   diltiazem  (CARDIZEM  CD) 120 MG 24 hr capsule Take 1 capsule (120 mg total) by mouth in the morning and at bedtime.   doxazosin  (CARDURA ) 1 MG tablet Take 1 tablet (1 mg total) by mouth at bedtime.   EPINEPHrine 0.3 mg/0.3 mL IJ SOAJ injection Inject 0.3 mg into the muscle as needed for anaphylaxis.   hydrALAZINE  (APRESOLINE ) 25 MG tablet Take 1 tablet (25 mg total) by mouth 3 (three) times daily as needed (for pressure >150).   hydrochlorothiazide  (HYDRODIURIL ) 25 MG tablet Take 1 tablet (25 mg total) by mouth daily.   lidocaine  (LIDODERM ) 5 % Place 1 patch onto the skin daily. Remove & Discard patch within 12 hours  or as directed by MD   metoprolol  succinate (TOPROL -XL) 50 MG 24 hr tablet Take 1 tablet (50 mg total) by mouth daily.   omeprazole  (PRILOSEC) 20 MG capsule Take 20 mg by mouth daily.   promethazine (PHENERGAN) 12.5 MG tablet Take 1 tablet (12.5 mg total) by mouth every 8 (eight) hours as needed for nausea or vomiting.   rosuvastatin  (CRESTOR ) 10 MG tablet Take 10 mg by mouth daily.   sildenafil  (VIAGRA ) 50 MG tablet Take 1 tablet (50 mg total) by mouth daily as needed for erectile dysfunction.   telmisartan  (MICARDIS ) 80 MG tablet Take 1 tablet (80 mg total) by mouth daily.   traMADol  (ULTRAM ) 50 MG tablet Take 2 tablets (100 mg total) by mouth every 6 (six) hours as needed.   valACYclovir (VALTREX) 1000 MG tablet Take 1-2 g by mouth daily as needed (cold sores/fever blisters).      Allergies:   Bee venom   Social History   Tobacco Use   Smoking status: Never   Smokeless tobacco: Never  Vaping Use   Vaping status: Never Used  Substance Use Topics   Alcohol use: Yes    Comment: 1 glass of wine or a beer 2 to 3 days a week   Drug use: No     Family Hx: The patient's family history includes COPD in  his brother; Cancer in his sister and sister; Colon cancer in his brother and paternal grandmother; Coronary artery disease in his mother; Diabetes in his brother and father; Heart attack in his mother; Heart disease in his father and mother; Heart failure in his father.  ROS:   Please see the history of present illness.    Review of Systems  HENT: Negative.    Respiratory: Negative.    Cardiovascular: Negative.   Gastrointestinal: Negative.   Musculoskeletal:  Positive for back pain.  Neurological: Negative.   Psychiatric/Behavioral: Negative.    All other systems reviewed and are negative.    Labs/Other Tests and Data Reviewed:    Recent Labs: 05/25/2023: Hemoglobin 15.3; Platelets 190.0; TSH 0.70 11/24/2023: ALT 21; BUN 13; Creatinine, Ser 0.92; Potassium 4.0; Sodium 134   Recent Lipid Panel Lab Results  Component Value Date/Time   CHOL 130 11/24/2023 09:46 AM   TRIG 94.0 11/24/2023 09:46 AM   TRIG 108 05/01/2009 12:00 AM   HDL 50.00 11/24/2023 09:46 AM   CHOLHDL 3 11/24/2023 09:46 AM   LDLCALC 61 11/24/2023 09:46 AM   LDLDIRECT 68.0 11/24/2023 09:46 AM    Wt Readings from Last 3 Encounters:  04/03/24 194 lb 4 oz (88.1 kg)  11/29/23 189 lb 3.2 oz (85.8 kg)  07/19/23 188 lb (85.3 kg)     Exam:    BP 120/80 (BP Location: Left Arm, Patient Position: Sitting, Cuff Size: Normal)   Pulse 60   Ht 5' 6.75 (1.695 m)   Wt 194 lb 4 oz (88.1 kg)   BMI 30.65 kg/m  Constitutional:  oriented to person, place, and time. No distress.  HENT:  Head: Grossly normal Eyes:  no discharge. No scleral icterus.  Neck: No JVD, no carotid bruits  Cardiovascular: Regular rate and rhythm, no murmurs appreciated Pulmonary/Chest: Clear to auscultation bilaterally, no wheezes or rails Abdominal: Soft.  no distension.  no tenderness.  Musculoskeletal: Normal range of motion Neurological:  normal muscle tone. Coordination normal. No atrophy Skin: Skin warm and dry Psychiatric: normal  affect, pleasant   ASSESSMENT & PLAN:    SVT (supraventricular tachycardia) (HCC) Previously  noted to have frequent episodes of SVT on monitor, PACs Improved symptoms with diltiazem  ER 120 twice daily, metoprolol  succinate 50 daily For symptomatic bradycardia in the mid to low 50s may need to decrease metoprolol  succinate down to 25 daily  HYPERTENSION, BENIGN Blood pressure well-controlled, sometimes low Recommend he hydrate for low pressures  PVC's (premature ventricular contractions) Noted on monitor with frequent SVT episodes Continue diltiazem  and metoprolol   Mixed hyperlipidemia Cholesterol is at goal on the current lipid regimen. No changes to the medications were made.  CAD with stable angina Coronary calcification score 638 on scan in 2020 Currently with no symptoms of angina. No further workup at this time. Continue current medication regimen.  Cholesterol at goal A1c mildly elevated 6.4  History of syncope/near syncope Prior episode of syncope early 2022, likely orthostasis Previously recommended holding HCTZ on hot days when working in the garden On days with low blood pressure would hydrate, salt load  Diabetes type 2 A1c 6.4, Weight with minimal trend higher, he is determined to change diet and exercise to get weight lower  Signed, Evalene Lunger, MD  04/03/2024 8:13 AM    [facility] [address], [place], [place] [address]

## 2024-04-03 ENCOUNTER — Ambulatory Visit: Attending: Cardiovascular Disease | Admitting: Cardiovascular Disease

## 2024-04-03 ENCOUNTER — Encounter: Payer: Self-pay | Admitting: Cardiovascular Disease

## 2024-04-03 VITALS — BP 120/80 | HR 60 | Ht 66.75 in | Wt 194.2 lb

## 2024-04-03 DIAGNOSIS — I493 Ventricular premature depolarization: Secondary | ICD-10-CM

## 2024-04-03 DIAGNOSIS — R7303 Prediabetes: Secondary | ICD-10-CM | POA: Diagnosis not present

## 2024-04-03 DIAGNOSIS — I10 Essential (primary) hypertension: Secondary | ICD-10-CM

## 2024-04-03 DIAGNOSIS — I700 Atherosclerosis of aorta: Secondary | ICD-10-CM

## 2024-04-03 DIAGNOSIS — E782 Mixed hyperlipidemia: Secondary | ICD-10-CM | POA: Diagnosis not present

## 2024-04-03 DIAGNOSIS — I471 Supraventricular tachycardia, unspecified: Secondary | ICD-10-CM | POA: Diagnosis not present

## 2024-04-03 MED ORDER — HYDRALAZINE HCL 25 MG PO TABS: 25.0000 mg | ORAL_TABLET | Freq: Three times a day (TID) | ORAL | 3 refills | Status: AC | PRN

## 2024-04-03 MED ORDER — HYDROCHLOROTHIAZIDE 25 MG PO TABS: 25.0000 mg | ORAL_TABLET | Freq: Every day | ORAL | 3 refills | Status: AC

## 2024-04-03 MED ORDER — DILTIAZEM HCL ER COATED BEADS 120 MG PO CP24: 120.0000 mg | ORAL_CAPSULE | Freq: Two times a day (BID) | ORAL | 3 refills | Status: AC

## 2024-04-03 MED ORDER — METOPROLOL SUCCINATE ER 50 MG PO TB24: 50.0000 mg | ORAL_TABLET | Freq: Every day | ORAL | 3 refills | Status: AC

## 2024-04-03 MED ORDER — TELMISARTAN 80 MG PO TABS: 80.0000 mg | ORAL_TABLET | Freq: Every day | ORAL | 3 refills | Status: AC

## 2024-04-03 MED ORDER — ROSUVASTATIN CALCIUM 10 MG PO TABS: 10.0000 mg | ORAL_TABLET | Freq: Every day | ORAL | 3 refills | Status: AC

## 2024-04-03 NOTE — Patient Instructions (Addendum)
 Medication Instructions:  No changes  If you need a refill on your cardiac medications before your next appointment, please call your pharmacy.    Lab work: No new labs needed   Testing/Procedures: No new testing needed   Follow-Up: At [facility], you and your health needs are our priority.  As part of our continuing mission to provide you with exceptional heart care, we have created designated Provider Care Teams.  These Care Teams include your primary Cardiologist (physician) and Advanced Practice Providers (APPs -  Physician Assistants and Nurse Practitioners) who all work together to provide you with the care you need, when you need it.  You will need a follow up appointment in 6 months  Providers on your designated Care Team:   Lonni Meager, NP Bernardino Bring, PA-C Cadence [place], [place]  COVID-19 Vaccine Information can be found at: [email or web address] For questions related to vaccine distribution or appointments, please email [email or web address] or call [phone].

## 2024-04-21 ENCOUNTER — Encounter: Payer: Self-pay | Admitting: Cardiovascular Disease

## 2024-04-24 DIAGNOSIS — H401131 Primary open-angle glaucoma, bilateral, mild stage: Secondary | ICD-10-CM | POA: Diagnosis not present

## 2024-04-24 DIAGNOSIS — E119 Type 2 diabetes mellitus without complications: Secondary | ICD-10-CM | POA: Diagnosis not present

## 2024-04-24 DIAGNOSIS — H2513 Age-related nuclear cataract, bilateral: Secondary | ICD-10-CM | POA: Diagnosis not present

## 2024-04-24 LAB — OPHTHALMOLOGY REPORT-SCANNED

## 2024-06-12 ENCOUNTER — Ambulatory Visit (INDEPENDENT_AMBULATORY_CARE_PROVIDER_SITE_OTHER): Payer: Self-pay

## 2024-06-12 ENCOUNTER — Ambulatory Visit: Payer: Self-pay

## 2024-06-12 ENCOUNTER — Other Ambulatory Visit

## 2024-06-12 VITALS — BP 104/70 | HR 71 | Temp 99.7°F | Ht 67.0 in | Wt 190.8 lb

## 2024-06-12 DIAGNOSIS — R7303 Prediabetes: Secondary | ICD-10-CM | POA: Diagnosis not present

## 2024-06-12 DIAGNOSIS — J029 Acute pharyngitis, unspecified: Secondary | ICD-10-CM | POA: Diagnosis not present

## 2024-06-12 DIAGNOSIS — R058 Other specified cough: Secondary | ICD-10-CM

## 2024-06-12 DIAGNOSIS — E782 Mixed hyperlipidemia: Secondary | ICD-10-CM

## 2024-06-12 LAB — COMPREHENSIVE METABOLIC PANEL WITH GFR
ALT: 26 U/L (ref 3–53)
AST: 23 U/L (ref 5–37)
Albumin: 4.2 g/dL (ref 3.5–5.2)
Alkaline Phosphatase: 62 U/L (ref 39–117)
BUN: 12 mg/dL (ref 6–23)
CO2: 30 meq/L (ref 19–32)
Calcium: 8.9 mg/dL (ref 8.4–10.5)
Chloride: 99 meq/L (ref 96–112)
Creatinine, Ser: 0.93 mg/dL (ref 0.40–1.50)
GFR: 78.5 mL/min
Glucose, Bld: 108 mg/dL — ABNORMAL HIGH (ref 70–99)
Potassium: 4.2 meq/L (ref 3.5–5.1)
Sodium: 136 meq/L (ref 135–145)
Total Bilirubin: 1.2 mg/dL (ref 0.2–1.2)
Total Protein: 6.9 g/dL (ref 6.0–8.3)

## 2024-06-12 LAB — LIPID PANEL
Cholesterol: 93 mg/dL (ref 28–200)
HDL: 32.2 mg/dL — ABNORMAL LOW
LDL Cholesterol: 46 mg/dL (ref 10–99)
NonHDL: 60.81
Total CHOL/HDL Ratio: 3
Triglycerides: 72 mg/dL (ref 10.0–149.0)
VLDL: 14.4 mg/dL (ref 0.0–40.0)

## 2024-06-12 LAB — POCT INFLUENZA A/B
Influenza A, POC: NEGATIVE
Influenza B, POC: NEGATIVE

## 2024-06-12 LAB — POC COVID19 BINAXNOW: SARS Coronavirus 2 Ag: NEGATIVE

## 2024-06-12 LAB — HEMOGLOBIN A1C: Hgb A1c MFr Bld: 6.2 % (ref 4.6–6.5)

## 2024-06-12 LAB — POCT RAPID STREP A (OFFICE): Rapid Strep A Screen: NEGATIVE

## 2024-06-12 MED ORDER — BENZONATATE 200 MG PO CAPS: 200.0000 mg | ORAL_CAPSULE | Freq: Three times a day (TID) | ORAL | 0 refills | Status: DC | PRN

## 2024-06-12 NOTE — Progress Notes (Signed)
 "  Acute Office Visit  Subjective:    Patient ID: Mark Harrel., male    DOB: Nov 06, 1945, 79 y.o.   MRN: [id number]  Chief Complaint  Patient presents with   Sore Throat   Cough    Discussed the use of AI scribe software for clinical note transcription with the patient, who gave verbal consent to proceed.  History of Present Illness Mark Farley. Mark Farley is a 79 year old male who presents with a sore throat and cold symptoms after recent travel to [place].   He developed cold symptoms (chills, dry cough, sore throat around 06/06/2024) approximately three days before returning from a trip to [place], which began on December 28th and ended on January 9th. His wife also experienced similar symptoms, but hers resolved quickly. His symptoms have progressively worsened, and he now has a sore throat, primarily localized to the left side, without ear pain. He describes the sore throat as 'strange' and notes it hurts when he swallows, but he does not drool. No chest pain.   Initially, he experienced chills and suspected he might have the flu, prompting him to take Tamiflu  for four days. He had prescription for this for prophylaxis (from his PCP, he likes to travel).   He received his flu shot this year [employer]) and RSV shot last year. He denies any recent changes in his medicatio. He has not experienced any unintentional weight loss, night sweats, or fever, although he notes his normal temperature is usually around 97 degrees Fahrenheit. He has not had any thick nasal discharge or significant sinus congestion. He has not received a COVID vaccine recently.   He describes his cough as a 'nag' and mostly dry, with no productive sputum. No history of asthma, COPD (even though his chart states he has a h/o COPD), or smoking, although he mentions a note in his chart incorrectly indicating COPD. He has no history of diabetes, but he monitors his blood sugar daily, with a recent reading of 89 at  home. No chest pain, leg swelling, diarrhea, nausea, or vomiting.    ROS As per HPI    Objective:    BP 104/70 (BP Location: Right Arm, Patient Position: Sitting)   Pulse 71   Temp 99.7 F (37.6 C) (Oral)   Ht 5' 7 (1.702 m)   Wt 190 lb 12.8 oz (86.5 kg)   SpO2 96%   BMI 29.88 kg/m    Physical Exam Constitutional:      Appearance: Normal appearance.  HENT:     Head: Normocephalic and atraumatic.     Right Ear: Tympanic membrane normal. No tenderness. Tympanic membrane is not erythematous.     Left Ear: Tympanic membrane normal. No tenderness. Tympanic membrane is not erythematous.     Nose: Congestion present.     Mouth/Throat:     Mouth: Mucous membranes are moist.     Pharynx: No oropharyngeal exudate.     Tonsils: No tonsillar exudate or tonsillar abscesses. 2+ on the right. 3+ on the left.  Eyes:     Conjunctiva/sclera: Conjunctivae normal.  Neck:     Thyroid : No thyroid  mass or thyroid  tenderness.  Cardiovascular:     Rate and Rhythm: Normal rate and regular rhythm.  Pulmonary:     Effort: Pulmonary effort is normal.     Breath sounds: Normal breath sounds. No wheezing or rales.  Abdominal:     General: Bowel sounds are normal.     Palpations: Abdomen is  soft.     Tenderness: There is no guarding.  Musculoskeletal:     Cervical back: Neck supple. No rigidity.     Right lower leg: No edema.     Left lower leg: No edema.  Skin:    General: Skin is warm.  Neurological:     Mental Status: He is alert and oriented to person, place, and time.  Psychiatric:        Mood and Affect: Mood normal.        Behavior: Behavior normal.     Results for orders placed or performed in visit on 06/12/24  POCT Influenza A/B  Result Value Ref Range   Influenza A, POC Negative Negative   Influenza B, POC Negative Negative  POC COVID-19 BinaxNow  Result Value Ref Range   SARS Coronavirus 2 Ag Negative Negative  POCT rapid strep A  Result Value Ref Range   Rapid Strep  A Screen Negative Negative       Assessment & Plan:   Assessment & Plan Sore throat Symptoms persistent despite Tamiflu  (old prescription, took it twice a day for about 4 days, stopped after symptoms did not improve).  Differential includes strep throat, viral pharyngitis, influenza, and COVID-19. Recent travel and exposure noted. Swabbed for strep throat, influenza, and COVID-19. Orders:   POCT Influenza A/B   POC COVID-19 BinaxNow   POCT rapid strep A Influenza, COVID-19 and rapid strep A screening in clinic came back negative. Results discussed with the patient. Following symptomatic management recommended:  Use acetaminophen  (Tylenol ) 500 mg every 8 hourly as needed for fever, chills.  Drink plenty of water , warm tea, or broth to keep your throat moist. Honey 1 tsp twice a day , throat lozenges, or hard candy can reduce irritation. Mix  teaspoon salt in a cup of warm water  and gargle 2-3 times daily.  Use a cool-mist humidifier or take a steamy shower to ease dryness. Limit talking and avoid yelling.  Stay away from smoke and strong fumes.  Recommend emergent evaluation if  stridor, drooling, neck mass, difficulty swallowing or worsening symptoms.    Dry cough Lungs clear to auscultation.  Trial of Benzonatate  200 mg TID prn for cough suppression.  Prescription sent to the pharmacy.  Orders:   benzonatate  (TESSALON ) 200 MG capsule; Take 1 capsule (200 mg total) by mouth 3 (three) times daily as needed for cough.    Return for f/u with PCP Dr. Marylynn on 06/15/24 .  Luke Shade, MD  "

## 2024-06-12 NOTE — Progress Notes (Signed)
 Results discussed during office visit.  Luke Shade, MD

## 2024-06-12 NOTE — Assessment & Plan Note (Addendum)
 Lungs clear to auscultation.  Trial of Benzonatate  200 mg TID prn for cough suppression.  Prescription sent to the pharmacy.  Orders:   benzonatate  (TESSALON ) 200 MG capsule; Take 1 capsule (200 mg total) by mouth 3 (three) times daily as needed for cough.

## 2024-06-12 NOTE — Assessment & Plan Note (Addendum)
 Symptoms persistent despite Tamiflu  (old prescription, took it twice a day for about 4 days, stopped after symptoms did not improve).  Differential includes strep throat, viral pharyngitis, influenza, and COVID-19. Recent travel and exposure noted. Swabbed for strep throat, influenza, and COVID-19. Orders:   POCT Influenza A/B   POC COVID-19 BinaxNow   POCT rapid strep A Influenza, COVID-19 and rapid strep A screening in clinic came back negative. Results discussed with the patient. Following symptomatic management recommended:  Use acetaminophen  (Tylenol ) 500 mg every 8 hourly as needed for fever, chills.  Drink plenty of water , warm tea, or broth to keep your throat moist. Honey 1 tsp twice a day , throat lozenges, or hard candy can reduce irritation. Mix  teaspoon salt in a cup of warm water  and gargle 2-3 times daily.  Use a cool-mist humidifier or take a steamy shower to ease dryness. Limit talking and avoid yelling.  Stay away from smoke and strong fumes.  Recommend emergent evaluation if  stridor, drooling, neck mass, difficulty swallowing or worsening symptoms.

## 2024-06-12 NOTE — Patient Instructions (Addendum)
 For cough:  Trial of Benzonatate  200 mg three times a day as needed.   Use acetaminophen  (Tylenol ) 500 mg every 8 hourly as needed for fever, chills.  Drink plenty of water , warm tea, or broth to keep your throat moist. Honey 1 tsp twice a day , throat lozenges, or hard candy can reduce irritation. Mix  teaspoon salt in a cup of warm water  and gargle 2-3 times daily.  Use a cool-mist humidifier or take a steamy shower to ease dryness. Limit talking and avoid yelling.  Stay away from smoke and strong fumes.

## 2024-06-13 ENCOUNTER — Other Ambulatory Visit

## 2024-06-14 ENCOUNTER — Ambulatory Visit: Payer: Self-pay | Admitting: Internal Medicine

## 2024-06-15 ENCOUNTER — Telehealth: Payer: Self-pay | Admitting: Internal Medicine

## 2024-06-15 ENCOUNTER — Encounter: Payer: Self-pay | Admitting: Internal Medicine

## 2024-06-15 ENCOUNTER — Ambulatory Visit
Admission: RE | Admit: 2024-06-15 | Discharge: 2024-06-15 | Disposition: A | Discharge status: Home / Self Care | Attending: Internal Medicine | Admitting: Internal Medicine

## 2024-06-15 ENCOUNTER — Ambulatory Visit
Admission: RE | Admit: 2024-06-15 | Discharge: 2024-06-15 | Disposition: A | Source: Ambulatory Visit | Attending: Internal Medicine | Admitting: Internal Medicine

## 2024-06-15 ENCOUNTER — Ambulatory Visit: Admitting: Internal Medicine

## 2024-06-15 ENCOUNTER — Other Ambulatory Visit: Payer: Self-pay | Admitting: Internal Medicine

## 2024-06-15 VITALS — BP 120/64 | HR 69 | Temp 98.4°F | Ht 67.0 in | Wt 188.0 lb

## 2024-06-15 DIAGNOSIS — K7581 Nonalcoholic steatohepatitis (NASH): Secondary | ICD-10-CM

## 2024-06-15 DIAGNOSIS — I10 Essential (primary) hypertension: Secondary | ICD-10-CM | POA: Diagnosis not present

## 2024-06-15 DIAGNOSIS — J22 Unspecified acute lower respiratory infection: Secondary | ICD-10-CM | POA: Diagnosis not present

## 2024-06-15 DIAGNOSIS — R042 Hemoptysis: Secondary | ICD-10-CM

## 2024-06-15 DIAGNOSIS — J0110 Acute frontal sinusitis, unspecified: Secondary | ICD-10-CM

## 2024-06-15 DIAGNOSIS — R7303 Prediabetes: Secondary | ICD-10-CM

## 2024-06-15 DIAGNOSIS — R748 Abnormal levels of other serum enzymes: Secondary | ICD-10-CM

## 2024-06-15 MED ORDER — AMOXICILLIN-POT CLAVULANATE 875-125 MG PO TABS: 1.0000 | ORAL_TABLET | Freq: Two times a day (BID) | ORAL | 0 refills | Status: DC

## 2024-06-15 MED ORDER — PREDNISONE 10 MG PO TABS: ORAL_TABLET | ORAL | 0 refills | Status: DC

## 2024-06-15 MED ORDER — TRAMADOL HCL 50 MG PO TABS: 100.0000 mg | ORAL_TABLET | Freq: Four times a day (QID) | ORAL | 5 refills | Status: DC | PRN

## 2024-06-15 MED ORDER — SILDENAFIL CITRATE 50 MG PO TABS: 50.0000 mg | ORAL_TABLET | Freq: Every day | ORAL | 1 refills | Status: DC | PRN

## 2024-06-15 MED ORDER — HYDROCOD POLI-CHLORPHE POLI ER 10-8 MG/5ML PO SUER: 5.0000 mL | Freq: Two times a day (BID) | ORAL | 0 refills | Status: DC | PRN

## 2024-06-15 MED ORDER — LIDOCAINE 5 % EX PTCH: 1.0000 | MEDICATED_PATCH | CUTANEOUS | 3 refills | Status: DC

## 2024-06-15 MED ORDER — DOXAZOSIN MESYLATE 1 MG PO TABS: 1.0000 mg | ORAL_TABLET | Freq: Every evening | ORAL | 1 refills | Status: DC

## 2024-06-15 NOTE — Telephone Encounter (Signed)
 Non- opioid substance agreement has been scanned into the chart under release of information.

## 2024-06-15 NOTE — Telephone Encounter (Signed)
 Copied from CRM [id number]. Topic: Clinical - Prescription Issue >> Jun 15, 2024  4:45 PM Delon DASEN wrote: Reason for CRM: chlorpheniramine-HYDROcodone  (TUSSIONEX) 10-8 MG/5ML- need this sent to [facility], [facility] not have it

## 2024-06-15 NOTE — Patient Instructions (Addendum)
 YOUR LABS LOOK FINE  .  THE HDL WILL COME UP WITH RESOLUTION OF CURRENT SYMPTOMS AND RETURN TO NORMAL EXERCISE ETC  I  am treating you for bacterial sinusitis which is probably a  complication from your viral infection    I am prescribing an antibiotic (AUGMENTIN )   To manage the infection and the inflammation in your ear/sinuses, along with a prolonged 8 day prednsione taper and aVERY STRONG r cough suppressant to take at night   (it contains hydrocodone  in it so DO NOT COMBINE WITH TRAMADOL  AT NIGHT  I also advise ADDING  AFRIN NASAL SPRAY FOR 5 DAYS ONLY   to relieve the congestion    iF your chest x ray is abnormal I will be adding a second antibioti cto take along with the augmentin  g OTC meds to help with your other symptoms.    Please take a probiotic ( Align, Flora que or Culturelle) OR A GENERIC EQUIVALENT for three weeks since you are taking an  antibiotic to prevent a very serious antibiotic associated infection  Called clostridium dificile colitis that can cause diarrhea, multi organ failure, sepsis and death if not managed.

## 2024-06-15 NOTE — Progress Notes (Signed)
 "  Subjective:  Patient ID: Mark Farley., male    DOB: [birth year]  Age: 79 y.o. MRN: [id number]  CC: The primary encounter diagnosis was Blood-streaked sputum. Diagnoses of Acute non-recurrent frontal sinusitis, Acute respiratory infection, Metabolic dysfunction-associated steatohepatitis (MASH), and HYPERTENSION, BENIGN were also pertinent to this visit.   HPI Mark Farley Mark Farley. presents for  Chief Complaint  Patient presents with   Medical Management of Chronic Issues    Here for 6 mo f/u.   Mark Farley presents with a respiratory illness that has been present for the past 10 days,  and started  during a vacation in [place]  wit hsubjective fevers and chills on Jan 6 ,  followed by rhinorrhea, sore throat and cough.   He has had Laryngitis since Since Jan 12, when he was  evaluated by Dr Abbey ;  rapid tests for COVID, flu and strep negative.  He has failed to improve,  and reports that this morning he  noted that his sputum had a pink tinge to it. , and  nasal discharge has also been blood streaked.  He denies facial pain and shortness of breath;  sinuses are intermittenlty congested.  Taking tessalon  perles prescribedby Bair,  but not helping  with cough at all.  Not using a deconfestant or muculytic or bronchodilator.  The cough is quite forceful and has been waking him up frequently   Outpatient Medications Prior to Visit  Medication Sig Dispense Refill   aspirin  EC 81 MG tablet Take 81 mg by mouth daily. Swallow whole.     benzonatate  (TESSALON ) 200 MG capsule Take 1 capsule (200 mg total) by mouth 3 (three) times daily as needed for cough. 21 capsule 0   celecoxib  (CELEBREX ) 100 MG capsule Take 100 mg by mouth 2 (two) times daily.     Cyanocobalamin (VITAMIN B 12) 100 MCG LOZG Take by mouth daily.     diltiazem  (CARDIZEM  CD) 120 MG 24 hr capsule Take 1 capsule (120 mg total) by mouth in the morning and at bedtime. 180 capsule 3   EPINEPHrine 0.3 mg/0.3 mL IJ SOAJ injection Inject 0.3  mg into the muscle as needed for anaphylaxis.     hydrALAZINE  (APRESOLINE ) 25 MG tablet Take 1 tablet (25 mg total) by mouth 3 (three) times daily as needed (for pressure >150). 90 tablet 3   hydrochlorothiazide  (HYDRODIURIL ) 25 MG tablet Take 1 tablet (25 mg total) by mouth daily. 90 tablet 3   metoprolol  succinate (TOPROL -XL) 50 MG 24 hr tablet Take 1 tablet (50 mg total) by mouth daily. 90 tablet 3   omeprazole  (PRILOSEC) 20 MG capsule Take 20 mg by mouth daily.     promethazine  (PHENERGAN ) 12.5 MG tablet Take 1 tablet (12.5 mg total) by mouth every 8 (eight) hours as needed for nausea or vomiting. 20 tablet 0   rosuvastatin  (CRESTOR ) 10 MG tablet Take 1 tablet (10 mg total) by mouth daily. 90 tablet 3   telmisartan  (MICARDIS ) 80 MG tablet Take 1 tablet (80 mg total) by mouth daily. 90 tablet 3   valACYclovir (VALTREX) 1000 MG tablet Take 1-2 g by mouth daily as needed (cold sores/fever blisters).      traMADol  (ULTRAM ) 50 MG tablet Take 2 tablets (100 mg total) by mouth every 6 (six) hours as needed. 240 tablet 0   doxazosin  (CARDURA ) 1 MG tablet Take 1 tablet (1 mg total) by mouth at bedtime. 90 tablet 1   lidocaine  (LIDODERM ) 5 % Place 1  patch onto the skin daily. Remove & Discard patch within 12 hours or as directed by MD 90 patch 3   sildenafil  (VIAGRA ) 50 MG tablet Take 1 tablet (50 mg total) by mouth daily as needed for erectile dysfunction. 30 tablet 1   No facility-administered medications prior to visit.    Review of Systems;  Patient denies  unintentional weight loss, skin rash, eye pain, sinus  paindysphagia,   dyspnea, wheezing, chest pain, palpitations, orthopnea, edema, abdominal pain, nausea, melena, diarrhea, constipation, flank pain, dysuria, hematuria, urinary  Frequency, nocturia, numbness, tingling, seizures,  Focal weakness, Loss of consciousness,  Tremor, insomnia, depression, anxiety, and suicidal ideation.      Objective:  BP 120/64   Pulse 69   Temp 98.4 F  (36.9 C) (Oral)   Ht 5' 7 (1.702 m)   Wt 188 lb (85.3 kg)   SpO2 95%   BMI 29.44 kg/m   BP Readings from Last 3 Encounters:  06/15/24 120/64  06/12/24 104/70  04/03/24 120/80    Wt Readings from Last 3 Encounters:  06/15/24 188 lb (85.3 kg)  06/12/24 190 lb 12.8 oz (86.5 kg)  04/03/24 194 lb 4 oz (88.1 kg)    Physical Exam Vitals reviewed.  Constitutional:      General: He is not in acute distress.    Appearance: He is normal weight. He is ill-appearing. He is not toxic-appearing or diaphoretic.  HENT:     Head: Normocephalic.  Eyes:     General: No scleral icterus.       Right eye: No discharge.        Left eye: No discharge.     Conjunctiva/sclera: Conjunctivae normal.  Cardiovascular:     Rate and Rhythm: Normal rate and regular rhythm.     Heart sounds: Normal heart sounds.  Pulmonary:     Effort: Pulmonary effort is normal. No respiratory distress.     Breath sounds: Normal breath sounds.  Musculoskeletal:        General: Normal range of motion.     Cervical back: Normal range of motion.  Skin:    General: Skin is warm and dry.  Neurological:     General: No focal deficit present.     Mental Status: He is alert and oriented to person, place, and time. Mental status is at baseline.  Psychiatric:        Mood and Affect: Mood normal.        Behavior: Behavior normal.        Thought Content: Thought content normal.        Judgment: Judgment normal.     Lab Results  Component Value Date   HGBA1C 6.2 06/12/2024   HGBA1C 6.4 11/24/2023   HGBA1C 6.3 05/25/2023    Lab Results  Component Value Date   CREATININE 0.93 06/12/2024   CREATININE 0.92 11/24/2023   CREATININE 0.85 05/25/2023    Lab Results  Component Value Date   WBC 5.3 05/25/2023   HGB 15.3 05/25/2023   HCT 44.9 05/25/2023   PLT 190.0 05/25/2023   GLUCOSE 108 (H) 06/12/2024   CHOL 93 06/12/2024   TRIG 72.0 06/12/2024   HDL 32.20 (L) 06/12/2024   LDLDIRECT 68.0 11/24/2023   LDLCALC  46 06/12/2024   ALT 26 06/12/2024   AST 23 06/12/2024   NA 136 06/12/2024   K 4.2 06/12/2024   CL 99 06/12/2024   CREATININE 0.93 06/12/2024   BUN 12 06/12/2024   CO2 30 06/12/2024  TSH 0.70 05/25/2023   PSA 0.60 10/13/2021   INR 1.0 02/15/2023   HGBA1C 6.2 06/12/2024    No results found.  Assessment & Plan:  .Blood-streaked sputum -     DG Chest 2 View; Future  Acute non-recurrent frontal sinusitis Assessment & Plan: Given chronicity of symptoms, development of  blood in his rhinorrhea and sputum,   I am treating  with empiric antibiotics, topical decongestants   chest x ray is abnormal per my review, with hilar fullness,  possible nodule on the right.  Will request a STAT read and if coners are confirmed will order CT chest  for further evaluation    Orders: -     predniSONE ; 6 tablets daily for 3 days, then reduce by 1 tablet daily until gone  Dispense: 33 tablet; Refill: 0  Acute respiratory infection Assessment & Plan: Given chronicity of symptoms, development of  blood in his rhinorrhea and sputum,   I am treating  with empiric antibiotics, topical decongestants   chest x ray is abnormal per my review, with hilar fullness,  possible nodule on the right.  Will request a STAT read and if coners are confirmed will order CT chest  for further evaluation     Metabolic dysfunction-associated steatohepatitis (MASH) Assessment & Plan: No evidence of cirrhosis per GI consult. AFP tumor marker negative .  LFts normal  Fibrosis 4 Score = 1.85 Score is based on outdated labs. ALT, AST, and platelets should all be measured within the last 6 months for an accurate FIB-4 Score  Fib-4 interpretation is not validated for people under 35 or over 35 years of age. However, scores under 2.0 are generally considered low risk.    HYPERTENSION, BENIGN Assessment & Plan: Home readings have been < 130/90 on current regimen of telmisartan  , metoprolol   and diltiazem .no changes today   Lab  Results  Component Value Date   CREATININE 0.93 06/12/2024   Lab Results  Component Value Date   NA 136 06/12/2024   K 4.2 06/12/2024   CL 99 06/12/2024   CO2 30 06/12/2024   Lab Results  Component Value Date   LABMICR See below: 07/21/2022        Other orders -     Doxazosin  Mesylate; Take 1 tablet (1 mg total) by mouth at bedtime.  Dispense: 90 tablet; Refill: 1 -     Lidocaine ; Place 1 patch onto the skin daily. Remove & Discard patch within 12 hours or as directed by MD  Dispense: 90 patch; Refill: 3 -     Sildenafil  Citrate; Take 1 tablet (50 mg total) by mouth daily as needed for erectile dysfunction.  Dispense: 30 tablet; Refill: 1 -     Hydrocod Poli-Chlorphe Poli ER; Take 5 mLs by mouth every 12 (twelve) hours as needed for cough.  Dispense: 140 mL; Refill: 0 -     traMADol  HCl; Take 2 tablets (100 mg total) by mouth every 6 (six) hours as needed for moderate pain (pain score 4-6).  Dispense: 60 tablet; Refill: 5     I spent 34 minutes on the day of this face to face encounter reviewing patient's  most recent visit with cardiology,    prior relevant surgical and non surgical procedures, review of recent  labs and chest x ray , reviewing the assessment and plan with patient, and post visit ordering and reviewing of  diagnostics and therapeutics with patient  .   Follow-up: Return in about 6 months (around  12/13/2024) for follow up diabetes, chronic pain management.   Verneita LITTIE Kettering, MD "

## 2024-06-16 ENCOUNTER — Other Ambulatory Visit: Payer: Self-pay | Admitting: Internal Medicine

## 2024-06-16 ENCOUNTER — Ambulatory Visit: Payer: Self-pay | Admitting: Internal Medicine

## 2024-06-16 DIAGNOSIS — R911 Solitary pulmonary nodule: Secondary | ICD-10-CM | POA: Insufficient documentation

## 2024-06-16 DIAGNOSIS — J22 Unspecified acute lower respiratory infection: Secondary | ICD-10-CM | POA: Insufficient documentation

## 2024-06-16 MED ORDER — HYDROCOD POLI-CHLORPHE POLI ER 10-8 MG/5ML PO SUER: 5.0000 mL | Freq: Two times a day (BID) | ORAL | 0 refills | Status: DC | PRN

## 2024-06-16 NOTE — Addendum Note (Signed)
 Addended by: MARYLYNN VERNEITA CROME on: 06/16/2024 05:22 PM   Modules accepted: Orders

## 2024-06-16 NOTE — Telephone Encounter (Signed)
-----   Message from Verneita Kettering, MD sent at 06/16/2024  7:04 AM EST ----- Regarding: chest x ray appears abnormal Can you please call the reading room at [facility] today and ask for an expedited read of the chest x ray that was done yesterday on this patient? It looks abnormal to me and there are are prior films for comparison .  Thank you

## 2024-06-16 NOTE — Telephone Encounter (Signed)
 Spoke with [facility] Radiology Reading Room to make them aware of Dr Lula recommendations. Representative states she will ask to expedite the results, but it was not order that way to be read expeditiously. Ask representative for her name and she disconnected the line. Verbalized understanding.

## 2024-06-16 NOTE — Telephone Encounter (Signed)
 Copied from CRM [id number]. Topic: Clinical - Prescription Issue >> Jun 16, 2024  4:05 PM Alfonso HERO wrote: Reason for CRM: patient calling to ask if you could please send chlorpheniramine-HYDROcodone  (TUSSIONEX) 10-8 MG/5ML to [facility]

## 2024-06-16 NOTE — Addendum Note (Signed)
 Addended by: ANICE BELT on: 06/16/2024 01:20 PM   Modules accepted: Orders

## 2024-06-16 NOTE — Telephone Encounter (Signed)
 Already been taken care. Medication was sent to [facility] pharmacy.

## 2024-06-16 NOTE — Assessment & Plan Note (Signed)
 Home readings have been < 130/90 on current regimen of telmisartan  , metoprolol   and diltiazem .no changes today   Lab Results  Component Value Date   CREATININE 0.93 06/12/2024   Lab Results  Component Value Date   NA 136 06/12/2024   K 4.2 06/12/2024   CL 99 06/12/2024   CO2 30 06/12/2024   Lab Results  Component Value Date   LABMICR See below: 07/21/2022

## 2024-06-16 NOTE — Assessment & Plan Note (Signed)
 No evidence of cirrhosis per GI consult. AFP tumor marker negative .  LFts normal  Fibrosis 4 Score = 1.85 Score is based on outdated labs. ALT, AST, and platelets should all be measured within the last 6 months for an accurate FIB-4 Score  Fib-4 interpretation is not validated for people under 35 or over 79 years of age. However, scores under 2.0 are generally considered low risk.

## 2024-06-16 NOTE — Assessment & Plan Note (Addendum)
 Given chronicity of symptoms, development of  blood in his rhinorrhea and sputum,   I am treating  with empiric antibiotics, topical decongestants   chest x ray is abnormal per my review, with hilar fullness,  possible nodule on the right.  Will request a STAT read and if coners are confirmed will order CT chest  for further evaluation

## 2024-06-16 NOTE — Assessment & Plan Note (Signed)
 Noted on current chest x ray.  Per radiology it was present but smaller on 2020 CT (not mentioned I  the 2020 report).  Recommend referral to pulmonary nodule clinic

## 2024-06-17 ENCOUNTER — Other Ambulatory Visit: Payer: Self-pay | Admitting: Internal Medicine

## 2024-06-17 DIAGNOSIS — J0110 Acute frontal sinusitis, unspecified: Secondary | ICD-10-CM

## 2024-06-17 MED ORDER — LIDOCAINE 5 % EX PTCH: 1.0000 | MEDICATED_PATCH | CUTANEOUS | 3 refills | Status: AC

## 2024-06-17 MED ORDER — DOXAZOSIN MESYLATE 1 MG PO TABS: 1.0000 mg | ORAL_TABLET | Freq: Every evening | ORAL | 1 refills | Status: AC

## 2024-06-17 MED ORDER — SILDENAFIL CITRATE 50 MG PO TABS: 50.0000 mg | ORAL_TABLET | Freq: Every day | ORAL | 1 refills | Status: AC | PRN

## 2024-06-17 MED ORDER — PREDNISONE 10 MG PO TABS: ORAL_TABLET | ORAL | 0 refills | Status: DC

## 2024-06-17 MED ORDER — TRAMADOL HCL 50 MG PO TABS: 100.0000 mg | ORAL_TABLET | Freq: Four times a day (QID) | ORAL | 5 refills | Status: AC | PRN

## 2024-06-20 ENCOUNTER — Telehealth: Payer: Self-pay | Admitting: *Deleted

## 2024-06-20 NOTE — Telephone Encounter (Signed)
 Please call [facility] and cancel prednisone  patient wife in office on another matter with billing and requested. Stated they picked up prednisone  at [facility].

## 2024-06-20 NOTE — Telephone Encounter (Signed)
 done

## 2024-06-20 NOTE — Telephone Encounter (Signed)
 Error

## 2024-06-20 NOTE — Telephone Encounter (Signed)
 If okay I will resend prednisone  to [facility] and cancel the one at [facility].

## 2024-06-27 ENCOUNTER — Ambulatory Visit: Admitting: Pulmonary Disease

## 2024-06-27 NOTE — Telephone Encounter (Signed)
 Appt has been rescheduled to 1/29.  Nothing further needed.

## 2024-06-29 ENCOUNTER — Encounter: Payer: Self-pay | Admitting: Pulmonary Disease

## 2024-06-29 ENCOUNTER — Ambulatory Visit: Admitting: Pulmonary Disease

## 2024-06-29 VITALS — BP 106/66 | HR 60 | Temp 98.0°F | Ht 67.0 in | Wt 188.6 lb

## 2024-06-29 DIAGNOSIS — R911 Solitary pulmonary nodule: Secondary | ICD-10-CM

## 2024-06-29 DIAGNOSIS — J042 Acute laryngotracheitis: Secondary | ICD-10-CM

## 2024-06-29 DIAGNOSIS — R490 Dysphonia: Secondary | ICD-10-CM

## 2024-06-29 DIAGNOSIS — J040 Acute laryngitis: Secondary | ICD-10-CM

## 2024-06-29 MED ORDER — AZITHROMYCIN 250 MG PO TABS: ORAL_TABLET | ORAL | 0 refills | Status: AC

## 2024-06-29 NOTE — Patient Instructions (Signed)
 VISIT SUMMARY:  During your visit, we discussed the lung nodule found on your chest X-ray and your recent symptoms of acute laryngitis. We have planned further evaluations and treatments to address these issues.  YOUR PLAN:  -SOLITARY PULMONARY NODULE: A solitary pulmonary nodule is a small, round growth in the lung that can be benign or malignant. We found a 2.5 cm nodule on your chest X-ray, which is larger than the one seen in 2020. To further evaluate this nodule, we have ordered a chest CT scan. Based on the results, we will determine if a biopsy is necessary.  -ACUTE LARYNGITIS: Acute laryngitis is the inflammation of the larynx, often due to a viral infection, causing hoarseness and voice changes. Your symptoms have not improved with previous medications, suggesting a viral cause. We have prescribed an antibiotic to cover atypical bacteria and reduce inflammation. Additionally, we recommend voice rest to help reduce the swelling. If your symptoms persist, we may refer you to an ENT specialist.  INSTRUCTIONS:  Please complete the chest CT scan as soon as possible. Follow the prescribed antibiotic regimen and practice voice rest. If your symptoms of laryngitis do not improve, please contact our office for a potential referral to an ENT specialist.

## 2024-07-06 ENCOUNTER — Other Ambulatory Visit: Payer: Self-pay | Admitting: Internal Medicine

## 2024-07-06 MED ORDER — PREDNISONE 10 MG PO TABS: ORAL_TABLET | ORAL | 0 refills | Status: AC

## 2024-07-13 ENCOUNTER — Ambulatory Visit

## 2024-08-03 ENCOUNTER — Ambulatory Visit: Admitting: Pulmonary Disease

## 2024-12-12 ENCOUNTER — Other Ambulatory Visit

## 2024-12-15 ENCOUNTER — Ambulatory Visit: Admitting: Internal Medicine
